# Patient Record
Sex: Female | Born: 1950
Health system: Southern US, Community
[De-identification: ages and names within clinical notes are randomized; demographics above are authoritative.]

## PROBLEM LIST (undated history)

## (undated) DIAGNOSIS — H409 Unspecified glaucoma: Secondary | ICD-10-CM

## (undated) DIAGNOSIS — H269 Unspecified cataract: Secondary | ICD-10-CM

## (undated) DIAGNOSIS — K219 Gastro-esophageal reflux disease without esophagitis: Secondary | ICD-10-CM

## (undated) DIAGNOSIS — G43909 Migraine, unspecified, not intractable, without status migrainosus: Secondary | ICD-10-CM

## (undated) DIAGNOSIS — M8580 Other specified disorders of bone density and structure, unspecified site: Secondary | ICD-10-CM

## (undated) DIAGNOSIS — M51369 Other intervertebral disc degeneration, lumbar region without mention of lumbar back pain or lower extremity pain: Secondary | ICD-10-CM

## (undated) DIAGNOSIS — M47812 Spondylosis without myelopathy or radiculopathy, cervical region: Secondary | ICD-10-CM

## (undated) DIAGNOSIS — F419 Anxiety disorder, unspecified: Secondary | ICD-10-CM

## (undated) DIAGNOSIS — M5136 Other intervertebral disc degeneration, lumbar region: Secondary | ICD-10-CM

## (undated) DIAGNOSIS — I10 Essential (primary) hypertension: Secondary | ICD-10-CM

## (undated) DIAGNOSIS — M797 Fibromyalgia: Secondary | ICD-10-CM

## (undated) DIAGNOSIS — M1990 Unspecified osteoarthritis, unspecified site: Secondary | ICD-10-CM

## (undated) DIAGNOSIS — J302 Other seasonal allergic rhinitis: Secondary | ICD-10-CM

## (undated) DIAGNOSIS — G479 Sleep disorder, unspecified: Secondary | ICD-10-CM

## (undated) DIAGNOSIS — F329 Major depressive disorder, single episode, unspecified: Secondary | ICD-10-CM

## (undated) DIAGNOSIS — F32A Depression, unspecified: Secondary | ICD-10-CM

## (undated) HISTORY — PX: CATARACT EXTRACTION: SUR2

## (undated) HISTORY — DX: Other seasonal allergic rhinitis: J30.2

## (undated) HISTORY — DX: Migraine, unspecified, not intractable, without status migrainosus: G43.909

## (undated) HISTORY — DX: Other intervertebral disc degeneration, lumbar region without mention of lumbar back pain or lower extremity pain: M51.369

## (undated) HISTORY — DX: Depression, unspecified: F32.A

## (undated) HISTORY — DX: Fibromyalgia: M79.7

## (undated) HISTORY — DX: Other specified disorders of bone density and structure, unspecified site: M85.80

## (undated) HISTORY — DX: Unspecified glaucoma: H40.9

## (undated) HISTORY — DX: Spondylosis without myelopathy or radiculopathy, cervical region: M47.812

## (undated) HISTORY — DX: Sleep disorder, unspecified: G47.9

## (undated) HISTORY — DX: Other intervertebral disc degeneration, lumbar region: M51.36

## (undated) HISTORY — DX: Anxiety disorder, unspecified: F41.9

## (undated) HISTORY — DX: Unspecified cataract: H26.9

## (undated) HISTORY — DX: Major depressive disorder, single episode, unspecified: F32.9

## (undated) HISTORY — DX: Unspecified osteoarthritis, unspecified site: M19.90

## (undated) HISTORY — DX: Essential (primary) hypertension: I10

## (undated) HISTORY — DX: Gastro-esophageal reflux disease without esophagitis: K21.9

---

## 1997-10-26 ENCOUNTER — Other Ambulatory Visit: Admission: RE | Admit: 1997-10-26 | Discharge: 1997-10-26 | Payer: Self-pay | Admitting: *Deleted

## 1998-02-14 ENCOUNTER — Other Ambulatory Visit: Admission: RE | Admit: 1998-02-14 | Discharge: 1998-02-14 | Payer: Self-pay | Admitting: *Deleted

## 1998-04-25 ENCOUNTER — Other Ambulatory Visit: Admission: RE | Admit: 1998-04-25 | Discharge: 1998-04-25 | Payer: Self-pay | Admitting: *Deleted

## 1999-05-09 ENCOUNTER — Other Ambulatory Visit: Admission: RE | Admit: 1999-05-09 | Discharge: 1999-05-09 | Payer: Self-pay | Admitting: *Deleted

## 2000-01-04 ENCOUNTER — Encounter: Payer: Self-pay | Admitting: Family Medicine

## 2000-01-04 ENCOUNTER — Encounter: Admission: RE | Admit: 2000-01-04 | Discharge: 2000-01-04 | Payer: Self-pay | Admitting: Family Medicine

## 2000-01-11 ENCOUNTER — Encounter: Admission: RE | Admit: 2000-01-11 | Discharge: 2000-01-11 | Payer: Self-pay | Admitting: Family Medicine

## 2000-01-11 ENCOUNTER — Encounter: Payer: Self-pay | Admitting: Family Medicine

## 2000-05-15 ENCOUNTER — Other Ambulatory Visit: Admission: RE | Admit: 2000-05-15 | Discharge: 2000-05-15 | Payer: Self-pay | Admitting: *Deleted

## 2000-05-17 ENCOUNTER — Encounter: Payer: Self-pay | Admitting: *Deleted

## 2000-05-17 ENCOUNTER — Encounter: Admission: RE | Admit: 2000-05-17 | Discharge: 2000-05-17 | Payer: Self-pay | Admitting: *Deleted

## 2001-03-03 ENCOUNTER — Encounter: Payer: Self-pay | Admitting: Family Medicine

## 2001-03-03 ENCOUNTER — Encounter: Admission: RE | Admit: 2001-03-03 | Discharge: 2001-03-03 | Payer: Self-pay | Admitting: Family Medicine

## 2001-05-27 ENCOUNTER — Other Ambulatory Visit: Admission: RE | Admit: 2001-05-27 | Discharge: 2001-05-27 | Payer: Self-pay | Admitting: Obstetrics & Gynecology

## 2001-08-11 ENCOUNTER — Encounter: Admission: RE | Admit: 2001-08-11 | Discharge: 2001-08-11 | Payer: Self-pay | Admitting: Family Medicine

## 2001-08-11 ENCOUNTER — Encounter: Payer: Self-pay | Admitting: Family Medicine

## 2001-11-10 ENCOUNTER — Encounter: Admission: RE | Admit: 2001-11-10 | Discharge: 2001-11-10 | Payer: Self-pay | Admitting: Family Medicine

## 2001-11-10 ENCOUNTER — Encounter: Payer: Self-pay | Admitting: Family Medicine

## 2002-03-31 ENCOUNTER — Encounter: Payer: Self-pay | Admitting: Family Medicine

## 2002-03-31 ENCOUNTER — Encounter: Admission: RE | Admit: 2002-03-31 | Discharge: 2002-03-31 | Payer: Self-pay | Admitting: Family Medicine

## 2002-04-10 ENCOUNTER — Encounter: Admission: RE | Admit: 2002-04-10 | Discharge: 2002-04-10 | Payer: Self-pay | Admitting: Family Medicine

## 2002-04-10 ENCOUNTER — Encounter: Payer: Self-pay | Admitting: Family Medicine

## 2002-06-01 ENCOUNTER — Other Ambulatory Visit: Admission: RE | Admit: 2002-06-01 | Discharge: 2002-06-01 | Payer: Self-pay | Admitting: Obstetrics and Gynecology

## 2003-01-29 ENCOUNTER — Inpatient Hospital Stay (HOSPITAL_COMMUNITY): Admission: AD | Admit: 2003-01-29 | Discharge: 2003-02-03 | Payer: Self-pay | Admitting: Family Medicine

## 2003-01-30 ENCOUNTER — Encounter: Payer: Self-pay | Admitting: Internal Medicine

## 2003-02-09 ENCOUNTER — Encounter: Admission: RE | Admit: 2003-02-09 | Discharge: 2003-02-09 | Payer: Self-pay | Admitting: Family Medicine

## 2003-02-09 ENCOUNTER — Encounter: Payer: Self-pay | Admitting: Family Medicine

## 2003-05-24 ENCOUNTER — Encounter: Admission: RE | Admit: 2003-05-24 | Discharge: 2003-05-24 | Payer: Self-pay | Admitting: Family Medicine

## 2003-07-13 ENCOUNTER — Encounter: Admission: RE | Admit: 2003-07-13 | Discharge: 2003-07-13 | Payer: Self-pay | Admitting: Obstetrics and Gynecology

## 2003-09-22 ENCOUNTER — Encounter: Admission: RE | Admit: 2003-09-22 | Discharge: 2003-09-22 | Payer: Self-pay | Admitting: Family Medicine

## 2003-10-01 ENCOUNTER — Encounter: Admission: RE | Admit: 2003-10-01 | Discharge: 2003-10-01 | Payer: Self-pay | Admitting: Family Medicine

## 2003-10-22 ENCOUNTER — Encounter: Admission: RE | Admit: 2003-10-22 | Discharge: 2003-10-22 | Payer: Self-pay | Admitting: Family Medicine

## 2004-04-05 ENCOUNTER — Ambulatory Visit: Payer: Self-pay | Admitting: Oncology

## 2004-06-13 ENCOUNTER — Encounter: Admission: RE | Admit: 2004-06-13 | Discharge: 2004-06-13 | Payer: Self-pay | Admitting: Family Medicine

## 2004-12-15 ENCOUNTER — Encounter: Admission: RE | Admit: 2004-12-15 | Discharge: 2004-12-15 | Payer: Self-pay | Admitting: Family Medicine

## 2005-02-27 ENCOUNTER — Ambulatory Visit: Payer: Self-pay | Admitting: Internal Medicine

## 2005-03-09 ENCOUNTER — Ambulatory Visit: Payer: Self-pay | Admitting: Internal Medicine

## 2005-04-12 ENCOUNTER — Encounter: Admission: RE | Admit: 2005-04-12 | Discharge: 2005-04-12 | Payer: Self-pay | Admitting: Family Medicine

## 2005-04-24 ENCOUNTER — Encounter: Admission: RE | Admit: 2005-04-24 | Discharge: 2005-04-24 | Payer: Self-pay | Admitting: Family Medicine

## 2005-06-15 ENCOUNTER — Encounter: Admission: RE | Admit: 2005-06-15 | Discharge: 2005-06-15 | Payer: Self-pay | Admitting: Family Medicine

## 2005-06-19 ENCOUNTER — Encounter: Admission: RE | Admit: 2005-06-19 | Discharge: 2005-06-19 | Payer: Self-pay | Admitting: Family Medicine

## 2005-06-20 ENCOUNTER — Ambulatory Visit: Payer: Self-pay | Admitting: Internal Medicine

## 2005-06-22 ENCOUNTER — Ambulatory Visit: Payer: Self-pay | Admitting: Internal Medicine

## 2005-07-02 ENCOUNTER — Encounter: Admission: RE | Admit: 2005-07-02 | Discharge: 2005-07-02 | Payer: Self-pay | Admitting: Obstetrics and Gynecology

## 2005-07-20 ENCOUNTER — Ambulatory Visit: Payer: Self-pay | Admitting: Internal Medicine

## 2005-09-26 ENCOUNTER — Ambulatory Visit: Payer: Self-pay | Admitting: Internal Medicine

## 2005-10-15 ENCOUNTER — Encounter: Admission: RE | Admit: 2005-10-15 | Discharge: 2005-10-15 | Payer: Self-pay | Admitting: Family Medicine

## 2005-10-30 ENCOUNTER — Ambulatory Visit: Payer: Self-pay | Admitting: Internal Medicine

## 2006-05-20 ENCOUNTER — Ambulatory Visit: Payer: Self-pay | Admitting: Internal Medicine

## 2006-05-28 HISTORY — PX: HAMMER TOE SURGERY: SHX385

## 2006-05-28 HISTORY — PX: OTHER SURGICAL HISTORY: SHX169

## 2006-07-09 ENCOUNTER — Encounter: Admission: RE | Admit: 2006-07-09 | Discharge: 2006-07-09 | Payer: Self-pay | Admitting: Family Medicine

## 2006-09-02 ENCOUNTER — Ambulatory Visit: Payer: Self-pay | Admitting: Oncology

## 2006-09-03 LAB — CBC WITH DIFFERENTIAL (CANCER CENTER ONLY)
BASO#: 0 10*3/uL (ref 0.0–0.2)
BASO%: 0.6 % (ref 0.0–2.0)
EOS%: 2.7 % (ref 0.0–7.0)
Eosinophils Absolute: 0.1 10*3/uL (ref 0.0–0.5)
HCT: 42.6 % (ref 34.8–46.6)
HGB: 14.5 g/dL (ref 11.6–15.9)
LYMPH#: 2.1 10*3/uL (ref 0.9–3.3)
LYMPH%: 49.6 % — ABNORMAL HIGH (ref 14.0–48.0)
MCH: 29.7 pg (ref 26.0–34.0)
MCHC: 34 g/dL (ref 32.0–36.0)
MCV: 87 fL (ref 81–101)
MONO#: 0.3 10*3/uL (ref 0.1–0.9)
MONO%: 7.7 % (ref 0.0–13.0)
NEUT#: 1.6 10*3/uL (ref 1.5–6.5)
NEUT%: 39.4 % — ABNORMAL LOW (ref 39.6–80.0)
Platelets: 201 10*3/uL (ref 145–400)
RBC: 4.88 10*6/uL (ref 3.70–5.32)
RDW: 13.1 % (ref 10.5–14.6)
WBC: 4.1 10*3/uL (ref 3.9–10.0)

## 2006-09-06 LAB — HYPERCOAGULABLE PANEL, COMPREHENSIVE
AntiThromb III Func: 120 % (ref 75–120)
Anticardiolipin IgA: <7 [APL'U] (ref <13)
Anticardiolipin IgG: <7 [GPL'U] (ref <11)
Anticardiolipin IgM: <7 [MPL'U] (ref <10)
Beta-2 Glyco I IgG: 5 U/mL (ref <20)
Beta-2-Glycoprotein I IgA: <4 U/mL (ref <10)
Beta-2-Glycoprotein I IgM: <4 U/mL (ref <10)
DRVVT: 36.5 secs (ref 36.1–47.0)
Homocysteine: 6.6 umol/L (ref 4.0–15.4)
PTT Lupus Anticoagulant: 36.5 secs (ref 36.3–48.8)
Protein C Activity: 153 % — ABNORMAL HIGH (ref 91–147)
Protein C, Total: 102 % (ref 70–140)
Protein S Activity: 109 % (ref 81–180)
Protein S Ag, Total: 141 % — ABNORMAL HIGH (ref 70–140)

## 2006-09-06 LAB — COMPREHENSIVE METABOLIC PANEL
ALT: 18 U/L (ref 0–35)
AST: 23 U/L (ref 0–37)
Albumin: 4.1 g/dL (ref 3.5–5.2)
Alkaline Phosphatase: 73 U/L (ref 39–117)
BUN: 19 mg/dL (ref 6–23)
CO2: 29 mEq/L (ref 19–32)
Calcium: 10 mg/dL (ref 8.4–10.5)
Chloride: 105 mEq/L (ref 96–112)
Creatinine, Ser: 0.92 mg/dL (ref 0.40–1.20)
Glucose, Bld: 67 mg/dL — ABNORMAL LOW (ref 70–99)
Potassium: 3.7 mEq/L (ref 3.5–5.3)
Sodium: 143 mEq/L (ref 135–145)
Total Bilirubin: 0.6 mg/dL (ref 0.3–1.2)
Total Protein: 7 g/dL (ref 6.0–8.3)

## 2006-09-06 LAB — LACTATE DEHYDROGENASE: LDH: 184 U/L (ref 94–250)

## 2006-09-06 LAB — FERRITIN: Ferritin: 11 ng/mL (ref 10–291)

## 2006-09-06 LAB — IRON AND TIBC
%SAT: 23 % (ref 20–55)
Iron: 96 ug/dL (ref 42–145)
TIBC: 417 ug/dL (ref 250–470)
UIBC: 321 ug/dL

## 2006-09-08 ENCOUNTER — Emergency Department (HOSPITAL_COMMUNITY): Admission: EM | Admit: 2006-09-08 | Discharge: 2006-09-08 | Payer: Self-pay | Admitting: Emergency Medicine

## 2007-03-10 ENCOUNTER — Ambulatory Visit: Payer: Self-pay | Admitting: Internal Medicine

## 2007-07-18 ENCOUNTER — Encounter: Admission: RE | Admit: 2007-07-18 | Discharge: 2007-07-18 | Payer: Self-pay | Admitting: Family Medicine

## 2007-08-07 DIAGNOSIS — M47812 Spondylosis without myelopathy or radiculopathy, cervical region: Secondary | ICD-10-CM | POA: Insufficient documentation

## 2007-08-07 DIAGNOSIS — I10 Essential (primary) hypertension: Secondary | ICD-10-CM | POA: Insufficient documentation

## 2007-08-07 DIAGNOSIS — H409 Unspecified glaucoma: Secondary | ICD-10-CM | POA: Insufficient documentation

## 2007-08-07 DIAGNOSIS — K219 Gastro-esophageal reflux disease without esophagitis: Secondary | ICD-10-CM | POA: Insufficient documentation

## 2007-08-07 DIAGNOSIS — K224 Dyskinesia of esophagus: Secondary | ICD-10-CM | POA: Insufficient documentation

## 2007-08-07 DIAGNOSIS — F341 Dysthymic disorder: Secondary | ICD-10-CM | POA: Insufficient documentation

## 2007-08-07 DIAGNOSIS — I82409 Acute embolism and thrombosis of unspecified deep veins of unspecified lower extremity: Secondary | ICD-10-CM | POA: Insufficient documentation

## 2007-08-07 DIAGNOSIS — R011 Cardiac murmur, unspecified: Secondary | ICD-10-CM | POA: Insufficient documentation

## 2007-08-07 DIAGNOSIS — Z862 Personal history of diseases of the blood and blood-forming organs and certain disorders involving the immune mechanism: Secondary | ICD-10-CM | POA: Insufficient documentation

## 2007-08-07 DIAGNOSIS — E785 Hyperlipidemia, unspecified: Secondary | ICD-10-CM | POA: Insufficient documentation

## 2007-08-07 DIAGNOSIS — IMO0001 Reserved for inherently not codable concepts without codable children: Secondary | ICD-10-CM | POA: Insufficient documentation

## 2007-08-07 DIAGNOSIS — K449 Diaphragmatic hernia without obstruction or gangrene: Secondary | ICD-10-CM | POA: Insufficient documentation

## 2007-08-07 DIAGNOSIS — G4730 Sleep apnea, unspecified: Secondary | ICD-10-CM | POA: Insufficient documentation

## 2007-08-07 DIAGNOSIS — K222 Esophageal obstruction: Secondary | ICD-10-CM | POA: Insufficient documentation

## 2008-04-30 ENCOUNTER — Telehealth: Payer: Self-pay | Admitting: Internal Medicine

## 2008-05-04 ENCOUNTER — Ambulatory Visit: Payer: Self-pay | Admitting: Internal Medicine

## 2008-05-05 LAB — CONVERTED CEMR LAB: Vitamin B-12: 460 pg/mL (ref 211–911)

## 2008-05-06 LAB — CONVERTED CEMR LAB: Tissue Transglutaminase Ab, IgA: 0 units (ref <7)

## 2008-05-07 ENCOUNTER — Telehealth: Payer: Self-pay | Admitting: Internal Medicine

## 2008-05-10 ENCOUNTER — Telehealth: Payer: Self-pay | Admitting: Internal Medicine

## 2008-05-11 ENCOUNTER — Encounter: Payer: Self-pay | Admitting: Internal Medicine

## 2008-05-11 ENCOUNTER — Telehealth: Payer: Self-pay | Admitting: Internal Medicine

## 2008-06-17 ENCOUNTER — Telehealth: Payer: Self-pay | Admitting: Internal Medicine

## 2008-07-21 ENCOUNTER — Encounter: Admission: RE | Admit: 2008-07-21 | Discharge: 2008-07-21 | Payer: Self-pay | Admitting: Family Medicine

## 2008-08-23 ENCOUNTER — Telehealth: Payer: Self-pay | Admitting: Internal Medicine

## 2009-04-19 ENCOUNTER — Encounter: Admission: RE | Admit: 2009-04-19 | Discharge: 2009-04-19 | Payer: Self-pay | Admitting: Family Medicine

## 2009-06-20 ENCOUNTER — Encounter: Admission: RE | Admit: 2009-06-20 | Discharge: 2009-06-20 | Payer: Self-pay | Admitting: Family Medicine

## 2009-07-05 ENCOUNTER — Encounter: Admission: RE | Admit: 2009-07-05 | Discharge: 2009-07-05 | Payer: Self-pay | Admitting: Family Medicine

## 2009-07-26 ENCOUNTER — Encounter: Admission: RE | Admit: 2009-07-26 | Discharge: 2009-07-26 | Payer: Self-pay | Admitting: Family Medicine

## 2009-10-20 ENCOUNTER — Encounter: Admission: RE | Admit: 2009-10-20 | Discharge: 2009-10-20 | Payer: Self-pay | Admitting: Family Medicine

## 2010-02-07 ENCOUNTER — Encounter: Admission: RE | Admit: 2010-02-07 | Discharge: 2010-02-07 | Payer: Self-pay | Admitting: Emergency Medicine

## 2010-02-10 ENCOUNTER — Encounter: Admission: RE | Admit: 2010-02-10 | Discharge: 2010-02-10 | Payer: Self-pay | Admitting: Emergency Medicine

## 2010-04-04 ENCOUNTER — Encounter: Admission: RE | Admit: 2010-04-04 | Discharge: 2010-04-04 | Payer: Self-pay | Admitting: Family Medicine

## 2010-05-26 ENCOUNTER — Encounter
Admission: RE | Admit: 2010-05-26 | Discharge: 2010-05-26 | Payer: Self-pay | Source: Home / Self Care | Attending: Family Medicine | Admitting: Family Medicine

## 2010-05-28 HISTORY — PX: ROTATOR CUFF REPAIR: SHX139

## 2010-06-18 ENCOUNTER — Encounter: Payer: Self-pay | Admitting: Obstetrics

## 2010-06-18 ENCOUNTER — Encounter: Payer: Self-pay | Admitting: Family Medicine

## 2010-06-18 ENCOUNTER — Encounter: Payer: Self-pay | Admitting: Neurology

## 2010-06-23 ENCOUNTER — Other Ambulatory Visit: Payer: Self-pay | Admitting: Obstetrics

## 2010-06-23 DIAGNOSIS — Z1239 Encounter for other screening for malignant neoplasm of breast: Secondary | ICD-10-CM

## 2010-06-29 NOTE — Progress Notes (Signed)
Summary: Zegrid   Phone Note Call from Patient Call back at Work Phone [phone]   Call For: Dr Juanda Chance Reason for Call: Talk to Nurse Summary of Call: Regarding her Zegrid Initial call taken by: Leanor Kail [facility],  May 11, 2008 10:24 AM  Follow-up for Phone Call        I have called pharmacy who states that they have gotten zegerid to go through.  Patient advised. Follow-up by: Hortense Ramal CMA,  May 11, 2008 3:45 PM

## 2010-06-29 NOTE — Procedures (Signed)
Summary: Gastroenterology EGD  Gastroenterology EGD   Imported By: Milford Cage CMA 08/07/2007 11:09:29  _____________________________________________________________________  External Attachment:    Type:   Image     Comment:   External Document

## 2010-06-29 NOTE — Assessment & Plan Note (Signed)
Summary: WORSENING GERD     (SEE TRIAGE FROM 04-30-08)    DEBORAH    History of Present Illness Visit Type: follow up Primary GI MD: Lina Sar MD Primary Provider: Mosetta Putt, MD Chief Complaint: GERD, Hiatal Hernia symptoms worsening, constant clearing throat. Pt. requesting testing for ciliac disease and lactose intolerence, also questioning b12 deficiency. History of Present Illness:   This is a 60 year old white female with chronic gastroesophageal reflux. Her last upper endoscopy in January 2007 showed a hiatal hernia and mild esophageal stricture. The patient did better after beginning Zegerid 40 mg twice a day and Carafate slurry. She was dilated with Savary dilators to 16 mm. She is now having recurrence of her reflux symptoms, having breakthrough heartburn at night. She is on omeprazole 20 mg twice a day. Stress is currently a big problem.   GI Review of Systems    Reports abdominal pain, acid reflux, belching, bloating, and  heartburn.     Location of  Abdominal pain: epigastric area.    Denies chest pain, dysphagia with liquids, dysphagia with solids, loss of appetite, nausea, vomiting, vomiting blood, weight loss, and  weight gain.      Reports hemorrhoids and  irritable bowel syndrome.     Denies anal fissure, black tarry stools, change in bowel habit, constipation, diarrhea, diverticulosis, fecal incontinence, heme positive stool, jaundice, light color stool, liver problems, rectal bleeding, and  rectal pain.     Prior Medications Reviewed Using: List Brought by Patient  Updated Prior Medication List: OMEPRAZOLE 20 MG CPDR (OMEPRAZOLE) Take 1 tablet by mouth daily. AXERT 12.5 MG TABS (ALMOTRIPTAN MALATE) as needed for migraine NASONEX 50 MCG/ACT SUSP (MOMETASONE FUROATE) as needed * SOOTHE LUBRICANT EYE DROPS 1 drop in each eye daily SIMPLY SALINE 0.9 % AERS (SALINE) nasal spray as needed for allergies CITRACAL/VITAMIN D 250-200 MG-UNIT TABS (CALCIUM CITRATE-VITAMIN  D) 1 tablet by mouth two times a day FISH OIL 300 MG CAPS (OMEGA-3 FATTY ACIDS) 1 capsule three times a day with meals D 1000 PLUS  TABS (FA-CYANOCOBALAMIN-B6-D-CA) 1 tablet by mouth once daily MULTIVITAMINS   TABS (MULTIPLE VITAMIN) 1 tablet by mouth once daily ASPIRIN 81 MG  TABS (ASPIRIN) 1 tablet by mouth once daily  Current Allergies (reviewed today): ! ERYTHROMYCIN  Past Medical History:    Reviewed history from 08/07/2007 and no changes required:       Current Problems:        HIATAL HERNIA (ICD-553.3)       ESOPHAGEAL STRICTURE (ICD-530.3)       DYSKINESIA OF ESOPHAGUS (ICD-530.5)       ANEMIA, IRON DEFICIENCY, HX OF (ICD-V12.3)       GERD (ICD-530.81)       SPONDYLOSIS, CERVICAL (ICD-721.0)       GLAUCOMA (ICD-365.9)       DVT (ICD-453.40)       FIBROMYALGIA (ICD-729.1)       HEART MURMUR, BENIGN (ICD-785.2)       SLEEP APNEA (ICD-780.57)       ANXIETY DEPRESSION (ICD-300.4)       HYPERLIPIDEMIA (ICD-272.4)       HYPERTENSION (ICD-401.9)         Past Surgical History:    Reviewed history from 08/07/2007 and no changes required:       c-section  1982       foot surgery   Family History:    Reviewed history from 04/30/2008 and no changes required:       Family  History of Heart Disease: Father       No FH of Colon Cancer:  Social History:    Reviewed history from 04/30/2008 and no changes required:       Occupation: Runner, broadcasting/film/video       Alcohol Use - no       Illicit Drug Use - no    Review of Systems       The patient complains of allergy/sinus.     Vital Signs:  Patient Profile:   59 Years Old Female Height:     60 inches Weight:      153.13 pounds BMI:     30.01 BSA:     1.67 Pulse rate:   80 / minute Pulse rhythm:   regular BP sitting:   130 / 80  (left arm) Cuff size:   regular  Vitals Entered By: Teresita Madura CMA (May 04, 2008 2:30 PM)                  Physical Exam  General:     Well developed, well nourished, no acute  distress. Neck:     Supple; no masses or thyromegaly. Lungs:     Clear throughout to auscultation. Heart:     Regular rate and rhythm; no murmurs, rubs,  or bruits. Abdomen:     soft abdomen with tenderness in epigastrium, normoactive bowel sounds, no distention, no hernia. Extremities:     No clubbing, cyanosis, edema or deformities noted.    Impression & Recommendations:  Problem # 1:  GERD (ICD-530.81) chronic gastroesophageal reflux only partially controlled on omeprazole 20 mg a day. I have given her some samples of Zegerid 40 mg twice a day. Patient willl comply with strict antireflux measures. She is interested in having her B12 rechecked. We will do this as well as a tissue transglutamase level. She was also interested in having a lactose intolerance test but she would have to go to [facility] to have that done due to the fact that our hospital does not offer that test.  Other Orders: TLB-B12, Serum-Total ONLY ([id number]) T-Tissue Transglutamase Ab IgA ([id number])   Patient Instructions: 1)  Zegerid 40 mg p.o. b.i.d. 2)  Antireflux measures 3)  B12 level, tissue transglutaminase, 4)  If the symptoms continue, repeat upper endoscopy 5)  Copy Sent To:Dr Kirk.Braver    ]

## 2010-06-29 NOTE — Progress Notes (Signed)
Summary: RX NOT IN PHARMACY  Medications Added ZEGERID 40-1100 MG CAPS (OMEPRAZOLE-SODIUM BICARBONATE) Take 1 tablet by mouth two times a day       Phone Note Call from Patient Call back at Home Phone [phone]   Caller: Patient Call For: Skyelar Swigart Reason for Call: Talk to Nurse Summary of Call: RX WAS NEVER CALLED IN FOR ZEGERID CAN WE PLEASE CALL IT IN TODAY [facility] [address] Initial call taken by: Tawni Levy,  May 07, 2008 3:51 PM  Follow-up for Phone Call        Patient advised that rx has been sent to pharmacy. Follow-up by: Hortense Ramal CMA,  May 07, 2008 4:00 PM    New/Updated Medications: ZEGERID 40-1100 MG CAPS (OMEPRAZOLE-SODIUM BICARBONATE) Take 1 tablet by mouth two times a day   Prescriptions: ZEGERID 40-1100 MG CAPS (OMEPRAZOLE-SODIUM BICARBONATE) Take 1 tablet by mouth two times a day  #60 x 2   Entered by:   Hortense Ramal CMA   Authorized by:   Hart Carwin MD   Signed by:   Hortense Ramal CMA on 05/07/2008   Method used:   [facility]ronically to        [phone][facility] (retail)       [address]       [place], [place]  [address]       Ph: [phone] or [phone]       Fax: [phone]   RxID:   [id number]

## 2010-06-29 NOTE — Medication Information (Signed)
Summary: RX Folder-Zegerid  RX Folder-Zegerid   Imported By: Hortense Ramal CMA 05/11/2008 15:17:27  _____________________________________________________________________  External Attachment:    Type:   Image     Comment:   External Document

## 2010-06-29 NOTE — Progress Notes (Signed)
Summary: RX ?   Phone Note Call from Patient Call back at Home Phone [phone] Call back at [phone]   Caller: Patient Call For: Wynema Garoutte Reason for Call: Talk to Nurse Details for Reason: RX ? Summary of Call: pt said Omeprazole was sent to pahrmacy, but pt says she was supposed to be switched b/c said drug has not been working... [facility] [place], [place]  Initial call taken by: Vallarie Mare,  May 10, 2008 4:01 PM  Follow-up for Phone Call        patient advised rx for zegerid has already been sent to pharmacy and has already been approved by insurance company. Follow-up by: Hortense Ramal CMA,  May 10, 2008 4:33 PM

## 2010-06-29 NOTE — Progress Notes (Signed)
Summary: meds denied  Medications Added OMEPRAZOLE 20 MG TBEC (OMEPRAZOLE) Take 1 tablet by mouth two times a day       Phone Note Call from Patient Call back at Home Phone [phone] Call back at C # [phone] call after 9:20 am    Caller: Patient Call For: BRODIE  Reason for Call: Talk to Nurse Details for Reason: meds denied Summary of Call: was told by pharm pt needs OV before refill wishes to discuss with Dottie Initial call taken by: Guadlupe Spanish [facility],  August 23, 2008 9:06 AM  Follow-up for Phone Call        Rx sent.  Patient no longer on zegerid due to side effects. Follow-up by: Hortense Ramal CMA,  August 23, 2008 9:15 AM    New/Updated Medications: OMEPRAZOLE 20 MG TBEC (OMEPRAZOLE) Take 1 tablet by mouth two times a day   Prescriptions: OMEPRAZOLE 20 MG TBEC (OMEPRAZOLE) Take 1 tablet by mouth two times a day  #60 x 3   Entered by:   Hortense Ramal CMA   Authorized by:   Hart Carwin   Signed by:   Hortense Ramal CMA on 08/23/2008   Method used:   [facility]ronically to        [facility] [phone][facility] (retail)       [address]       [place], [place]  [address]       Ph: [phone] or [phone]       Fax: [phone]   RxID:   [id number]

## 2010-06-29 NOTE — Procedures (Signed)
Summary: COLON   Colonoscopy  Procedure date:  03/09/2005  Findings:      Location:  [facility].    Procedures Next Due Date:    Colonoscopy: 02/2015  Patient Name: Anna Arnold, Anna Arnold. MRN:  Procedure Procedures: Colonoscopy CPT: [phone].  Personnel: Endoscopist: Dora L. Juanda Chance, MD.  Referred By: Mosetta Putt, M.D.  Exam Location: Exam performed in Outpatient Clinic. Outpatient  Patient Consent: Procedure, Alternatives, Risks and Benefits discussed, consent obtained, from patient. Consent was obtained by the RN.  Indications Symptoms: Constipation Patient's stools are infrequent.  Average Risk Screening Routine.  History  Current Medications: Patient is not currently taking Coumadin.  Pre-Exam Physical: Performed Mar 09, 2005. Entire physical exam was normal.  Exam Exam: Extent of exam reached: Cecum, extent intended: Cecum.  The cecum was identified by appendiceal orifice and IC valve. Colon retroflexion performed. Images taken. ASA Classification: I. Tolerance: good.  Monitoring: Pulse and BP monitoring, Oximetry used. Supplemental O2 given.  Colon Prep Used Miralax for colon prep. Prep results: good.  Sedation Meds: Patient assessed and found to be appropriate for moderate (conscious) sedation. Fentanyl 75 mcg. given IV. Versed 7 mg. given IV.  Findings - NORMAL EXAM: Ileum to Cecum.  - NORMAL EXAM: Rectum.   Assessment Normal examination.  Comments: no polyps Events  Unplanned Interventions: No intervention was required.  Unplanned Events: There were no complications. Plans Patient Education: Patient given standard instructions for: Yearly hemoccult testing recommended. Patient instructed to get routine colonoscopy every 10 years.  Comments: follow up with Dr Duaine Dredge Disposition: After procedure patient sent to recovery. After recovery patient sent home.    This report was created from the original endoscopy report, which  was reviewed and signed by the above listed endoscopist.

## 2010-06-29 NOTE — Progress Notes (Signed)
Summary: TRIAGE-Worsening GERD  Medications Added OMEPRAZOLE 20 MG CPDR (OMEPRAZOLE) Take 1 tablet by mouth daily.       Phone Note Call from Patient Call back at Work Phone [phone]   Call For: Dr Juanda Chance Reason for Call: Refill Medication Summary of Call: Omeprozole only has one pill left. Was adviced she needed rov before refill but has tried to make appoinment for the past month and has not been able to. Next avail is now middle of January. She needs to be seen sooner. Is having some issues, can we work her in?  Initial call taken by: Leanor Kail [facility],  April 30, 2008 9:21 AM  Follow-up for Phone Call        Pt. c/o worsening GERD-indigestion,bloating,increased belching. Takes Omeprazole once daily. 1)Increase Omeprazole to two times a day X 5 days,then back to daily. 2) Gas-x,Phazyme, etc. as needed for gas & bloating. 3)Soft,bland diet. No spicy,greasy,fried foods. 4) Appointment w/Dr.Brodie on 05-04-08 at 2pm 5)Pt. instructed to call back as needed.    Follow-up by: Laureen Ochs LPN,  April 30, 2008 9:39 AM    New/Updated Medications: OMEPRAZOLE 20 MG CPDR (OMEPRAZOLE) Take 1 tablet by mouth daily.   Prescriptions: OMEPRAZOLE 20 MG CPDR (OMEPRAZOLE) Take 1 tablet by mouth daily.  #30 x 0   Entered by:   Laureen Ochs LPN   Authorized by:   Hart Carwin MD   Signed by:   Laureen Ochs LPN on 09/81/1914   Method used:   Electronically to        [facility] [phone][facility] (retail)       [address]       [place], [place]  [address]       Ph: [phone] or [phone]       Fax: [phone]   RxID:   [id number]   Appended Document: TRIAGE-Worsening GERD Above triage reviewed w/Dr.Brodie by phone, no new orders.

## 2010-06-29 NOTE — Progress Notes (Signed)
Summary: CONDITION UPDATE   Phone Note Call from Patient Call back at Home Phone [phone] Call back at [phone]   Caller: Patient Call For: Dr. Juanda Chance Reason for Call: Talk to Nurse Details for Reason: back on Omeprazole Summary of Call: pt was on Zegrid and switched herself back to Omeprazole b/c the Zegrid was causing side effects such as... rapid heartrate daily headaches nausea weight gain bloated extreme fatigue blurred vision abd pain constipation increased agitation flatulence pt is now feeling better since being back on Omeprazole... pt aware that weight is a factor in her GERD and knows that she needs to lose weight to help with her GERD symptoms... pt just wanted to give an update Initial call taken by: Vallarie Mare,  June 17, 2008 9:38 AM  Follow-up for Phone Call        Reviewed.  Follow-up by: Hart Carwin MD,  June 17, 2008 1:29 PM

## 2010-07-28 ENCOUNTER — Ambulatory Visit
Admission: RE | Admit: 2010-07-28 | Discharge: 2010-07-28 | Disposition: A | Discharge status: Home / Self Care | Payer: BC Managed Care – PPO | Source: Ambulatory Visit | Attending: Obstetrics | Admitting: Obstetrics

## 2010-07-28 DIAGNOSIS — Z1239 Encounter for other screening for malignant neoplasm of breast: Secondary | ICD-10-CM

## 2010-08-01 ENCOUNTER — Other Ambulatory Visit: Payer: Self-pay | Admitting: Family Medicine

## 2010-08-01 ENCOUNTER — Ambulatory Visit
Admission: RE | Admit: 2010-08-01 | Discharge: 2010-08-01 | Disposition: A | Discharge status: Home / Self Care | Payer: BC Managed Care – PPO | Source: Ambulatory Visit | Attending: Family Medicine | Admitting: Family Medicine

## 2010-08-01 DIAGNOSIS — M25511 Pain in right shoulder: Secondary | ICD-10-CM

## 2010-10-10 NOTE — Assessment & Plan Note (Signed)
[facility]                         GASTROENTEROLOGY OFFICE NOTE   NAME:Brothers, DARIUS                        MRN:          [id number]  DATE:03/10/2007                            DOB:          April 29, 1951    Ms. Hedman is a 60 year old white female with history of  gastroesophageal reflux, epigastric pain.  Last endoscopy January 2007.  She also has a history of iron deficiency anemia.  Her colonoscopy in  October 2006 was normal.  She has a history of dysphagia.  She has  reflux esophagitis and mild esophageal stricture with hiatal hernia.  She was initially on Carafate, __________ , and Zegerid 40 mg b.i.d.,  but most recently on omeprazole 20 mg p.o. b.i.d. with good control of  her symptoms.  She came today for refill.   MEDICATIONS:  1. Cymbalta 30 mg p.o. daily.  2. Omeprazole 20 mg p.o. b.i.d.  3. Gabapentin 100 mg p.o. daily.  4. Methocarbamol 500 mg p.r.n.  5. Axert 12.5 mg p.r.n.  6. Restasis Refresh sustained eye drops.  7. Flonase.  8. Vitamins.  9. Caltrate.  10.Vitamin D.   PHYSICAL EXAMINATION:  Blood pressure 120/78.  Pulse 84.  Weight 150  pounds.  Patient was not examined.   IMPRESSION:  Gastroesophageal reflux disease.  Currently asymptomatic  with omeprazole.   PLAN:  Refill for omeprazole 20 mg p.o. b.i.d. x1 year.     Hedwig Morton. Juanda Chance, MD  Electronically Signed    DMB/MedQ  DD: 03/10/2007  DT: 03/11/2007  Job #: [id number]   cc:   Mosetta Putt, M.D.

## 2010-10-13 NOTE — Discharge Summary (Signed)
NAME:  Anna Arnold, Anna Arnold                         ACCOUNT NO.:  [id number]   MEDICAL RECORD NO.:  [id number]                   PATIENT TYPE:  INP   LOCATION:  5030                                 FACILITY:  [facility]   PHYSICIAN:  Elliot Cousin, M.D.                 DATE OF BIRTH:  04/17/1951   DATE OF ADMISSION:  01/29/2003  DATE OF DISCHARGE:  02/03/2003                                 DISCHARGE SUMMARY   DISCHARGE DIAGNOSES:  1. Acute right lower extremity deep vein thrombosis.     a. Right lower extremity venous Doppler per [facility] January 29, 2003 revealed a right posterior tibial        vein with thrombus visualized from the ankle to the midcalf, no        evidence of thrombus in the popliteal or greater saphenous vein,        multiple varicosities were visualized with no evidence of thrombosis.  2. Hypertension.  3. Hypokalemia.  4. Depression.   SECONDARY DISCHARGE DIAGNOSES:  1. Chronic migraines.  2. Anxiety.  3. Sleep apnea (not on continuous positive airway pressure).  4. Glaucoma bilaterally.  5. Gastroesophageal reflux disease.  6. Fibromyalgia.  7. Status post cesarean section with pregnancy in the past.  8. Allergic rhinitis.  9. Cervical disk degenerative joint disease.   DISCHARGE MEDICATIONS:  1. Coumadin 4 mg each evening.  2. Effexor XR 75 mg daily.  3. Wellbutrin SR 150 mg daily.  4. BuSpar 15 mg b.i.d.  5. Verapamil 240 mg daily.  6. Xalatan ophthalmic drops 0.005 % one drop in each eye at bedtime.  7. Rhinocort Aqua one spray in each nostril b.i.d.  8. Maxalt 10 mg p.r.n. migraine.  9. Aciphex 20 mg daily.  10.      Calcium carbonate 600 mg b.i.d.  11.      Over-the-counter nasal spray as needed.  12.      DISCONTINUE PREMPRO.   DISCHARGE DISPOSITION:  Anna Arnold was discharged to home on February 03, 2003 in improved and stable condition.  She was advised to follow up with  her primary care physician in the  next day or two for blood work,  specifically to re-check an INR/PT level.  The patient was advised no  driving for 8-65 days and to stay out of work until she is reevaluated by  her primary care physician, Mosetta Putt, M.D.   HISTORY:  Anna Arnold is a 60 year old lady with a past medical history  significant for depression, migraines, hypertension, and postmenopausal who  presented to her primary care physician, Dr. Duaine Dredge on January 29, 2003  with a one to two week history of right lower leg pain.  The pain was  initially mild to moderate.  However, it increased to severe pain over the  past two days prior to  hospital admission.  On the day of admission, she  stated that her right leg was hot, swollen, and heavy feeling.  She denied  any recent long trips.  She denied any trauma to her right leg.  She denied  any previous history of blood clot.  She had been on Prempro for several  years, however.  Her mammogram in March of 2004 was within normal limits.   When the patient was evaluated by Dr. Duaine Dredge in his office, he ordered a  venous Doppler study of her right leg at [facility].  The  results were positive for a thrombus in the right posterior tibial vein  visualized from the ankles to the midcalf.  The patient was therefore  admitted for evaluation and management of the right lower extremity DVT.   HOSPITAL COURSE:  Problem 1.  RIGHT LOWER EXTREMITY DEEP VEIN THROMBOSIS:  The initial management included treating the patient with Lovenox 1 mg per  kilogram subcu q.12 h.  Coumadin started at 5 mg daily was started the  following day.  The patient was treated with Vicodin 5 mg every 4 hours as  needed for pain.  She was provided with supportive care during the  hospitalization.  Specifically, she was to keep her right leg elevated and  she was provided with warm compresses as needed.  The patient was initially  advised to bedrest for 24-36 hours.  However, after  the pain and the  swelling subsided she was encouraged to ambulate in her room.  The pharmacy  was consulted for assistance with dosing of the Coumadin.  The patient was  maintained on Lovenox 1 mg per kilogram subcu q.12 h. throughout the entire  hospital course.  It was discontinued at hospital discharge.  The Coumadin  dosing was increased from 5 mg to 7.5 mg over the hospital course.  Her INRs  subsequently became therapeutic at 2.5 on the last day of hospitalization.  Per the pharmacist recommendation, the patient was changed to Coumadin 4 mg  daily at hospital discharge.  For now the patient will be discharged home on  4 mg daily.  She was advised to follow up with her primary care physician in  the next day or two for a reevaluation of the prothrombin time and INR.  The  patient was totally asymptomatic of right leg pain, swelling, and erythema.   The etiology of the right lower extremity deep vein thrombosis is probably  secondary to the Prempro.  Assessment of the protein C, protein S, and  antithrombin antibodies would be nondiagnostic in the setting of an acute  thrombus.  However, a homocystine level and cardiolipin antibodies were  assessed during the hospitalization.  The homocystine level was within  normal limits at 6.72 and the cardiolipin antibodies were 6 for the IgM and  1 for the IgG cardiolipin antibodies, both well below the positive values.  The patient was advised to stop the Prempro indefinitely.  She was also  advised not to take any other estrogen replacement medications.   Problem 2.  HYPERTENSION:  The patient's blood pressures were well  controlled during the hospital course.  She was maintained on verapamil 240  mg daily.   Problem 3.  HYPOKALEMIA:  The patient's initial potassium level was 3.2.  The cause of the low potassium level was unclear during the hospital course.  The patient has no prior treatment history with diuretics.  Nevertheless, she was  repleted with potassium by mouth.  A magnesium level was assessed  and found to be within normal limits at 2.1.  Her potassium level was 4.2  prior to hospital discharge.  The potassium supplementation was discontinued  at hospital discharge.   Problem 4.  DEPRESSION:  The patient was maintained on her home doses of  Effexor XR, Wellbutrin SR, and BuSpar.  She became a little teary one day  during the hospitalization.  However, she denied any major depressive  symptoms and she denied any suicidal ideation.  She was a little upset  because of having to be in the hospital.  Once the patient took a shower and was able to ambulate she felt  significantly better.  The patient was advised to continue treatment with  her antidepressant.  She was advised to follow up with her primary care  physician as stated above.                                                Elliot Cousin, M.D.    DF/MEDQ  D:  02/06/2003  T:  02/07/2003  Job:  [id number]   cc:   Mosetta Putt, M.D.  [address] [place]  [place] [address]  Fax: [phone]

## 2010-10-13 NOTE — Assessment & Plan Note (Signed)
[facility]                         GASTROENTEROLOGY OFFICE NOTE   NAME:Arnold, Anna                        MRN:          [id number]  DATE:05/20/2006                            DOB:          [birth year]    Anna Arnold is a 60 year old white female with irritable  syndrome/nonulcer dyspepsia presenting with recurrent epigastric pain.  She has a history of acid reflux.  Last upper endoscopy in June 22, 2005 was done because of anemia.  She had reflux esophagitis, mild  esophageal stricture and hiatal hernia.  She is now on combination of  Protonix 40 mg twice a day and Carafate slurry.  She had exacerbation of  her symptoms when she went to a Christmas party with her husband and had  some fried food.  The Carafate slurry seemed to help at 1 gm 4 times a  day.  She is now much better, but would like to switch back to  omeprazole because her co pay for Protonix is 50 dollars.  She has been  on low-carbohydrate diet, and has been effectively losing weight, all  together 20 pounds in the last year.   PHYSICAL EXAMINATION:  Blood pressure 120/70.  Pulse 70.  Weight 136.4  pounds.  She was alert, oriented and in no distress.  LUNGS:  Clear to auscultation.  COR:  Normal S1 and normal S2.  ABDOMEN:  Was soft, but tender in the epigastrium in the midline above  the umbilicus.  Normal left upper and lower quadrants, and right upper  and lower quadrants.  No distention.  RECTAL:  Exam not repeated.   IMPRESSION:  A 60 year old white female with known nonulcer dyspepsia  and gastroesophageal reflux exacerbated by certain foods.   PLAN:  1. Refilled Carafate slurry, generic is okay.  2. Switch to omeprazole 20 mg twice a day.  3. I will see her on a p.r.n. basis.     Hedwig Morton. Juanda Chance, MD  Electronically Signed    DMB/MedQ  DD: 05/20/2006  DT: 05/20/2006  Job #: [id number]   cc:   Mosetta Putt, M.D.

## 2010-10-13 NOTE — H&P (Signed)
NAME:  Anna Arnold, Anna Arnold                         ACCOUNT NO.:  [id number]   MEDICAL RECORD NO.:  [id number]                   PATIENT TYPE:  INP   LOCATION:  2041                                 FACILITY:  [facility]   PHYSICIAN:  Elliot Cousin, M.D.                 DATE OF BIRTH:  1951-01-12   DATE OF ADMISSION:  01/29/2003  DATE OF DISCHARGE:                                HISTORY & PHYSICAL   PRIMARY CARE PHYSICIAN:  Mosetta Putt, M.D.   CHIEF COMPLAINT:  Right lower extremity pain and swelling secondary to a  DVT.   HISTORY OF PRESENT ILLNESS:  Ms. Anna Arnold is a 60 year old patient with a  past medical history significant for depression, migraines and hypertension,  who presented to her primary care physician, Dr. Duaine Dredge, today with a one-  to two-week history of right lower leg pain.  The pain was mild-to-moderate  approximately a week or two ago, however, it has increased to severe pain.  The right leg is now hot, more swollen and heavy-feeling.  She denies any  recent long trips.  She denies any previous history of blood clots.  She  does have a family history of a blood clot in her maternal grandmother and  also in her mother where she believes there was a blood clot in her brain.  The patient has also been on Prempro for several years.  Her mammogram in  March of 2004 was within normal limits.   When the patient was evaluated by Dr. Elliot Gurney, he ordered a venous  Doppler study of her right leg at [facility].  The results were  positive for thrombus in the right posterior tibial vein, visualized from  the ankle to the mid-calf.  The patient was therefore admitted for  evaluation and management of the right lower extremity DVT.   REVIEW OF SYSTEMS:  The patient's review of systems is positive for a recent  wet cough, chronic constipation, chronic pain in her neck from arthritis,  chronic headache, and occasional shortness of breath, especially over the  past two to three weeks when singing.  Her review of systems is negative for  chest pain, pleurisy, abdominal pain, nausea, vomiting, diarrhea, blood in  her stools, melena, dysuria and swelling in her left leg.   PAST MEDICAL HISTORY:  1. Hypertension.  2. Chronic migraines.  3. Anxiety.  4. Depression.  5. Sleep apnea (not on CPAP).  6. Glaucoma bilaterally.  7. Gastroesophageal reflux disease.  8. Fibromyalgia.  9. Status post C-section with pregnancy.  10.      Allergic rhinitis.  11.      Cervical disk DJD.   MEDICATIONS:  1. Effexor XR 75 mg once daily.  2. Wellbutrin SR 150 mg daily.  3. BuSpar HCl 15 mg b.i.d.  4. Verapamil 240 mg daily.  5. Prempro 0.45/1.5 mg daily.  6. Xalatan ophthalmic drops 0.005% one  drop in each eye at bedtime.  7. Rhinocort Aqua one spray in each nostril twice daily (b.i.d.).  8. Maxalt 10 mg as needed for migraines.  9. Aciphex 20 mg daily.  10.      Calcium carbonate 600 mg b.i.d.  11.      Nasal spray daily as needed.   ALLERGIES:  The patient has no known drug allergies, however, she does have  intolerances to AUGMENTIN, BIAXIN and ERYTHROMYCIN.   SOCIAL HISTORY:  The patient is married and [place]  She has one daughter 59 years old.  She is  employed as a Librarian, academic.  She denies alcohol, drug and  tobacco use.   FAMILY HISTORY:  Her mother died at 55 years of age secondary to lung  cancer.  She did have a blood clot in her brain.  Her father is 71 years  of age and has hypertension, angina, with a history of coronary artery  disease.  She has a brother who is 51 years of age and has depression  glaucoma but no history of blood clots.  She has a brother who is 46 years  of age and healthy with no history of blood clots.   EXAMINATION:  VITAL SIGNS:  Temperature 99.3, pulse 73, blood pressure  127/79, oxygen saturation on room air 99%.  GENERAL:  The patient is a  pleasant, middle-aged, well-nourished Caucasian  woman who is currently lying in bed in no acute distress.  HEENT:  Head is normocephalic, nontraumatic.  Pupils are equal, round and  reactive to light.  Extraocular movements intact.  Conjunctivae are clear.  Sclerae are white.  External canals bilaterally of both of her ears are  clear.  Nasal mucosa is moist without any drainage.  Oropharynx has moist  mucous membranes, no posterior exudates or erythema.  Teeth are in good  repair/condition.  NECK:  Neck is supple.  No adenopathy, no thyromegaly, no bruit.  LUNGS:  Lungs are mostly clear, however, there is a rare crackle in the  right-greater-than-left base.  HEART:  S1 and S2, with no murmurs, rubs, or gallops.  ABDOMEN:  Positive bowel sounds.  Soft, nontender and nondistended.  No  hepatosplenomegaly and no masses palpated.  EXTREMITIES:  The patient's pedal pulses are 2+ bilaterally.  She has an  approximately 5- to 6-cm area of erythema of the right lower pretibial area  that is very warm and tender to palpation.  She has mild 1+ edema of the  right lower extremity.  The left lower extremity has no edema, no  tenderness, and no erythema.  No other joint abnormalities or skin  abnormalities seen.  NEUROLOGIC:  The patient is alert and oriented x3.  Cranial nerves II-XII  are intact.  Strength is intact throughout.  Sensation is intact throughout.   ADMISSION LABORATORIES:  Right lower extremity Doppler study per  [facility], outpatient report on January 29, 2003:  The results revealed right posterior tibial vein with thrombus  visualized from the ankle to the mid-calf, no evidence of thrombus in the  popliteal or greater saphenous veins, multiple varicosities were visualized  with no evidence of thrombosis.   EKG:  Normal sinus rhythm.  PT 13.1, INR 1.0, PTT 27.  Sodium 138, potassium 3.2, chloride 105, CO2 27,  glucose 126, BUN 14, creatinine 1.0,  total bilirubin 0.5, alkaline  phosphatase 61, SGOT 27, SGPT 25, total protein  6.5, albumin 3.6, calcium  8.9.  WBC 4.1, hemoglobin 13.7, hematocrit 40.0, MCV 89.9, platelets are  209,000.   ASSESSMENT:  1. Right lower extremity deep venous thrombosis.  The only obvious risk     factor for this deep venous thrombosis is hormone replacement therapy     with Prempro.  Certainly, hypercoagulable disorders are also in the     differential diagnosis, however, in the setting of an acute deep venous     thrombosis, assessing the antithrombin III, protein C and protein S     antibodies is not diagnostic.  2. Hypokalemia.  No obvious reason for the patient's potassium level to be     mildly low, however, it will need to be repleted.  3. Hypertension.  The patient's blood pressure is well within normal limits.  4. Chronic anxiety with depression.  The patient will be maintained on her     anxiolytic and antidepressant.  5. Mild nonproductive cough.  The patient will need to be assessed with a     chest x-ray.   PLAN:  1. The patient will be admitted to a telemetry bed.  2. She has been started on Lovenox 1 mg/kg subcu q.12 h.  Tomorrow, we will     add Coumadin at 5 mg daily and then titrate per pharmacy protocol.  3. Pain medication with hydrocodone and/or Tylenol as needed.  4. Keep legs elevated.  5. Obtain a preliminary chest x-ray, PA and lateral.  We will hold on CT     scan of the chest to rule out a PE, given that the patient does not have     chest pain and that she is oxygenating 99% on room air.  6. We will check homocysteine level as well as anticardiolipin antibodies.                                                Elliot Cousin, M.D.    DF/MEDQ  D:  01/29/2003  T:  01/30/2003  Job:  [id number]   cc:   Mosetta Putt, M.D.  [address] [place]  [place] [address]  Fax: [phone]

## 2010-10-13 NOTE — H&P (Signed)
   NAME:  Anna Arnold, Anna Arnold                         ACCOUNT NO.:  [id number]   MEDICAL RECORD NO.:  [id number]                   PATIENT TYPE:  INP   LOCATION:  2041                                 FACILITY:  [facility]   PHYSICIAN:  Elliot Cousin, M.D.                 DATE OF BIRTH:  [birth year]   DATE OF ADMISSION:  01/29/2003  DATE OF DISCHARGE:                                HISTORY & PHYSICAL   ADDENDUM:   PLAN:  The Prempro will be discontinued.  Elliot Cousin, M.D.    DF/MEDQ  D:  01/29/2003  T:  01/30/2003  Job:  [id number]   cc:   Mosetta Putt, M.D.  [address] [place]  [place] [address]  Fax: [phone]

## 2010-11-16 ENCOUNTER — Ambulatory Visit
Admission: RE | Admit: 2010-11-16 | Discharge: 2010-11-16 | Disposition: A | Discharge status: Home / Self Care | Payer: BC Managed Care – PPO | Source: Ambulatory Visit | Attending: Family Medicine | Admitting: Family Medicine

## 2010-11-16 ENCOUNTER — Other Ambulatory Visit: Payer: Self-pay | Admitting: Family Medicine

## 2010-11-16 DIAGNOSIS — M79603 Pain in arm, unspecified: Secondary | ICD-10-CM

## 2010-12-13 ENCOUNTER — Other Ambulatory Visit: Payer: Self-pay | Admitting: Family Medicine

## 2010-12-13 DIAGNOSIS — M542 Cervicalgia: Secondary | ICD-10-CM

## 2010-12-18 ENCOUNTER — Ambulatory Visit
Admission: RE | Admit: 2010-12-18 | Discharge: 2010-12-18 | Disposition: A | Discharge status: Home / Self Care | Payer: BC Managed Care – PPO | Source: Ambulatory Visit | Attending: Family Medicine | Admitting: Family Medicine

## 2010-12-18 DIAGNOSIS — M542 Cervicalgia: Secondary | ICD-10-CM

## 2011-01-05 ENCOUNTER — Other Ambulatory Visit: Payer: Self-pay | Admitting: Family Medicine

## 2011-01-05 ENCOUNTER — Ambulatory Visit
Admission: RE | Admit: 2011-01-05 | Discharge: 2011-01-05 | Disposition: A | Discharge status: Home / Self Care | Payer: BC Managed Care – PPO | Source: Ambulatory Visit | Attending: Family Medicine | Admitting: Family Medicine

## 2011-01-05 DIAGNOSIS — M545 Low back pain, unspecified: Secondary | ICD-10-CM

## 2011-05-29 HISTORY — PX: GLAUCOMA SURGERY: SHX656

## 2011-06-25 ENCOUNTER — Other Ambulatory Visit: Payer: Self-pay | Admitting: Obstetrics

## 2011-06-25 DIAGNOSIS — Z1231 Encounter for screening mammogram for malignant neoplasm of breast: Secondary | ICD-10-CM

## 2011-07-30 ENCOUNTER — Ambulatory Visit
Admission: RE | Admit: 2011-07-30 | Discharge: 2011-07-30 | Disposition: A | Discharge status: Home / Self Care | Payer: BC Managed Care – PPO | Source: Ambulatory Visit | Attending: Obstetrics | Admitting: Obstetrics

## 2011-07-30 DIAGNOSIS — Z1231 Encounter for screening mammogram for malignant neoplasm of breast: Secondary | ICD-10-CM

## 2011-11-02 ENCOUNTER — Other Ambulatory Visit: Payer: Self-pay | Admitting: Family Medicine

## 2011-11-02 DIAGNOSIS — R630 Anorexia: Secondary | ICD-10-CM

## 2011-11-02 DIAGNOSIS — R0600 Dyspnea, unspecified: Secondary | ICD-10-CM

## 2011-11-02 DIAGNOSIS — R110 Nausea: Secondary | ICD-10-CM

## 2011-11-05 ENCOUNTER — Ambulatory Visit
Admission: RE | Admit: 2011-11-05 | Discharge: 2011-11-05 | Disposition: A | Discharge status: Home / Self Care | Payer: BC Managed Care – PPO | Source: Ambulatory Visit | Attending: Family Medicine | Admitting: Family Medicine

## 2011-11-05 DIAGNOSIS — R0600 Dyspnea, unspecified: Secondary | ICD-10-CM

## 2011-11-05 DIAGNOSIS — R110 Nausea: Secondary | ICD-10-CM

## 2011-11-05 DIAGNOSIS — R630 Anorexia: Secondary | ICD-10-CM

## 2011-12-05 ENCOUNTER — Ambulatory Visit
Admission: RE | Admit: 2011-12-05 | Discharge: 2011-12-05 | Disposition: A | Discharge status: Home / Self Care | Payer: BC Managed Care – PPO | Source: Ambulatory Visit | Attending: Family Medicine | Admitting: Family Medicine

## 2011-12-05 ENCOUNTER — Other Ambulatory Visit: Payer: Self-pay | Admitting: Family Medicine

## 2011-12-05 DIAGNOSIS — R0600 Dyspnea, unspecified: Secondary | ICD-10-CM

## 2012-04-14 ENCOUNTER — Ambulatory Visit
Admission: RE | Admit: 2012-04-14 | Discharge: 2012-04-14 | Disposition: A | Discharge status: Home / Self Care | Payer: BC Managed Care – PPO | Source: Ambulatory Visit | Attending: Family Medicine | Admitting: Family Medicine

## 2012-04-14 ENCOUNTER — Other Ambulatory Visit: Payer: Self-pay | Admitting: Family Medicine

## 2012-04-14 DIAGNOSIS — R0602 Shortness of breath: Secondary | ICD-10-CM

## 2012-06-10 ENCOUNTER — Other Ambulatory Visit: Payer: Self-pay | Admitting: Family Medicine

## 2012-06-10 ENCOUNTER — Other Ambulatory Visit: Payer: Self-pay | Admitting: Obstetrics

## 2012-06-10 DIAGNOSIS — Z1231 Encounter for screening mammogram for malignant neoplasm of breast: Secondary | ICD-10-CM

## 2012-06-13 ENCOUNTER — Other Ambulatory Visit: Payer: Self-pay | Admitting: Family Medicine

## 2012-06-13 ENCOUNTER — Ambulatory Visit
Admission: RE | Admit: 2012-06-13 | Discharge: 2012-06-13 | Disposition: A | Discharge status: Home / Self Care | Payer: BC Managed Care – PPO | Source: Ambulatory Visit | Attending: Family Medicine | Admitting: Family Medicine

## 2012-06-13 DIAGNOSIS — M545 Low back pain, unspecified: Secondary | ICD-10-CM

## 2012-06-13 DIAGNOSIS — R635 Abnormal weight gain: Secondary | ICD-10-CM

## 2012-07-31 ENCOUNTER — Ambulatory Visit
Admission: RE | Admit: 2012-07-31 | Discharge: 2012-07-31 | Disposition: A | Discharge status: Home / Self Care | Payer: BC Managed Care – PPO | Source: Ambulatory Visit | Attending: Family Medicine | Admitting: Family Medicine

## 2012-07-31 DIAGNOSIS — Z1231 Encounter for screening mammogram for malignant neoplasm of breast: Secondary | ICD-10-CM

## 2012-12-31 ENCOUNTER — Other Ambulatory Visit: Payer: Self-pay | Admitting: Obstetrics & Gynecology

## 2012-12-31 DIAGNOSIS — M8580 Other specified disorders of bone density and structure, unspecified site: Secondary | ICD-10-CM

## 2013-05-05 ENCOUNTER — Ambulatory Visit
Admission: RE | Admit: 2013-05-05 | Discharge: 2013-05-05 | Disposition: A | Discharge status: Home / Self Care | Payer: BC Managed Care – PPO | Source: Ambulatory Visit | Attending: Obstetrics & Gynecology | Admitting: Obstetrics & Gynecology

## 2013-05-05 DIAGNOSIS — M8580 Other specified disorders of bone density and structure, unspecified site: Secondary | ICD-10-CM

## 2013-07-01 ENCOUNTER — Other Ambulatory Visit: Payer: Self-pay

## 2013-07-01 DIAGNOSIS — Z1231 Encounter for screening mammogram for malignant neoplasm of breast: Secondary | ICD-10-CM

## 2013-08-04 ENCOUNTER — Ambulatory Visit
Admission: RE | Admit: 2013-08-04 | Discharge: 2013-08-04 | Disposition: A | Discharge status: Home / Self Care | Payer: BC Managed Care – PPO | Source: Ambulatory Visit

## 2013-08-04 DIAGNOSIS — Z1231 Encounter for screening mammogram for malignant neoplasm of breast: Secondary | ICD-10-CM

## 2014-01-01 ENCOUNTER — Other Ambulatory Visit: Payer: Self-pay | Admitting: Family Medicine

## 2014-01-01 DIAGNOSIS — M25559 Pain in unspecified hip: Secondary | ICD-10-CM

## 2014-01-03 ENCOUNTER — Ambulatory Visit
Admission: RE | Admit: 2014-01-03 | Discharge: 2014-01-03 | Disposition: A | Discharge status: Home / Self Care | Payer: BC Managed Care – PPO | Source: Ambulatory Visit | Attending: Family Medicine | Admitting: Family Medicine

## 2014-01-03 DIAGNOSIS — M25559 Pain in unspecified hip: Secondary | ICD-10-CM

## 2014-11-25 ENCOUNTER — Ambulatory Visit (INDEPENDENT_AMBULATORY_CARE_PROVIDER_SITE_OTHER): Payer: 59

## 2014-11-25 DIAGNOSIS — R42 Dizziness and giddiness: Secondary | ICD-10-CM

## 2014-12-10 ENCOUNTER — Emergency Department (HOSPITAL_COMMUNITY): Payer: 59

## 2014-12-10 ENCOUNTER — Emergency Department (HOSPITAL_COMMUNITY)
Admission: EM | Admit: 2014-12-10 | Discharge: 2014-12-10 | Disposition: A | Discharge status: Home / Self Care | Payer: 59 | Attending: Emergency Medicine | Admitting: Emergency Medicine

## 2014-12-10 DIAGNOSIS — R200 Anesthesia of skin: Secondary | ICD-10-CM | POA: Insufficient documentation

## 2014-12-10 DIAGNOSIS — I10 Essential (primary) hypertension: Secondary | ICD-10-CM | POA: Diagnosis not present

## 2014-12-10 DIAGNOSIS — H6123 Impacted cerumen, bilateral: Secondary | ICD-10-CM

## 2014-12-10 DIAGNOSIS — Z7982 Long term (current) use of aspirin: Secondary | ICD-10-CM | POA: Diagnosis not present

## 2014-12-10 DIAGNOSIS — K219 Gastro-esophageal reflux disease without esophagitis: Secondary | ICD-10-CM | POA: Insufficient documentation

## 2014-12-10 DIAGNOSIS — Z8639 Personal history of other endocrine, nutritional and metabolic disease: Secondary | ICD-10-CM | POA: Diagnosis not present

## 2014-12-10 DIAGNOSIS — R61 Generalized hyperhidrosis: Secondary | ICD-10-CM | POA: Diagnosis not present

## 2014-12-10 DIAGNOSIS — Z79899 Other long term (current) drug therapy: Secondary | ICD-10-CM | POA: Insufficient documentation

## 2014-12-10 DIAGNOSIS — R110 Nausea: Secondary | ICD-10-CM | POA: Diagnosis not present

## 2014-12-10 DIAGNOSIS — R42 Dizziness and giddiness: Secondary | ICD-10-CM | POA: Diagnosis present

## 2014-12-10 LAB — DIFFERENTIAL
Basophils Absolute: 0 10*3/uL (ref 0.0–0.1)
Basophils Relative: 1 % (ref 0–1)
Eosinophils Absolute: 0.1 10*3/uL (ref 0.0–0.7)
Eosinophils Relative: 1 % (ref 0–5)
Lymphocytes Relative: 27 % (ref 12–46)
Lymphs Abs: 1.4 10*3/uL (ref 0.7–4.0)
Monocytes Absolute: 0.4 10*3/uL (ref 0.1–1.0)
Monocytes Relative: 8 % (ref 3–12)
Neutro Abs: 3.2 10*3/uL (ref 1.7–7.7)
Neutrophils Relative %: 63 % (ref 43–77)

## 2014-12-10 LAB — I-STAT CHEM 8, ED
BUN: 21 mg/dL — ABNORMAL HIGH (ref 6–20)
Calcium, Ion: 1.2 mmol/L (ref 1.13–1.30)
Chloride: 103 mmol/L (ref 101–111)
Creatinine, Ser: 0.9 mg/dL (ref 0.44–1.00)
Glucose, Bld: 104 mg/dL — ABNORMAL HIGH (ref 65–99)
HCT: 44 % (ref 36.0–46.0)
Hemoglobin: 15 g/dL (ref 12.0–15.0)
Potassium: 3.6 mmol/L (ref 3.5–5.1)
Sodium: 139 mmol/L (ref 135–145)
TCO2: 24 mmol/L (ref 0–100)

## 2014-12-10 LAB — CBC
HCT: 40.3 % (ref 36.0–46.0)
Hemoglobin: 13.9 g/dL (ref 12.0–15.0)
MCH: 30.3 pg (ref 26.0–34.0)
MCHC: 34.5 g/dL (ref 30.0–36.0)
MCV: 88 fL (ref 78.0–100.0)
Platelets: 216 10*3/uL (ref 150–400)
RBC: 4.58 MIL/uL (ref 3.87–5.11)
RDW: 12.9 % (ref 11.5–15.5)
WBC: 5.1 10*3/uL (ref 4.0–10.5)

## 2014-12-10 LAB — URINALYSIS, ROUTINE W REFLEX MICROSCOPIC
Bilirubin Urine: NEGATIVE
Glucose, UA: NEGATIVE mg/dL
Hgb urine dipstick: NEGATIVE
Ketones, ur: NEGATIVE mg/dL
Leukocytes, UA: NEGATIVE
Nitrite: NEGATIVE
Protein, ur: NEGATIVE mg/dL
Specific Gravity, Urine: 1.004 — ABNORMAL LOW (ref 1.005–1.030)
Urobilinogen, UA: 0.2 mg/dL (ref 0.0–1.0)
pH: 6.5 (ref 5.0–8.0)

## 2014-12-10 LAB — I-STAT TROPONIN, ED: Troponin i, poc: 0 ng/mL (ref 0.00–0.08)

## 2014-12-10 LAB — COMPREHENSIVE METABOLIC PANEL
ALT: 21 U/L (ref 14–54)
AST: 27 U/L (ref 15–41)
Albumin: 4.1 g/dL (ref 3.5–5.0)
Alkaline Phosphatase: 71 U/L (ref 38–126)
Anion gap: 9 (ref 5–15)
BUN: 19 mg/dL (ref 6–20)
CO2: 23 mmol/L (ref 22–32)
Calcium: 9.1 mg/dL (ref 8.9–10.3)
Chloride: 103 mmol/L (ref 101–111)
Creatinine, Ser: 0.96 mg/dL (ref 0.44–1.00)
GFR calc Af Amer: >60 mL/min (ref >60)
GFR calc non Af Amer: >60 mL/min (ref >60)
Glucose, Bld: 102 mg/dL — ABNORMAL HIGH (ref 65–99)
Potassium: 3.6 mmol/L (ref 3.5–5.1)
Sodium: 135 mmol/L (ref 135–145)
Total Bilirubin: 0.9 mg/dL (ref 0.3–1.2)
Total Protein: 6.9 g/dL (ref 6.5–8.1)

## 2014-12-10 LAB — RAPID URINE DRUG SCREEN, HOSP PERFORMED
Amphetamines: NOT DETECTED
Barbiturates: NOT DETECTED
Benzodiazepines: NOT DETECTED
Cocaine: NOT DETECTED
Opiates: NOT DETECTED
Tetrahydrocannabinol: NOT DETECTED

## 2014-12-10 LAB — APTT: aPTT: 26 seconds (ref 24–37)

## 2014-12-10 LAB — PROTIME-INR
INR: 1.03 (ref 0.00–1.49)
Prothrombin Time: 13.8 seconds (ref 11.6–15.2)

## 2014-12-10 MED ORDER — MECLIZINE HCL 25 MG PO TABS
25.0000 mg | ORAL_TABLET | Freq: Once | ORAL | Status: AC
  Administered 2014-12-10: 25 mg via ORAL
  Filled 2014-12-10: qty 1

## 2014-12-10 MED ORDER — MECLIZINE HCL 12.5 MG PO TABS: 12.5000 mg | ORAL_TABLET | Freq: Three times a day (TID) | ORAL | Status: DC | PRN

## 2014-12-10 MED ORDER — CARBAMIDE PEROXIDE 6.5 % OT SOLN: 5.0000 [drp] | Freq: Two times a day (BID) | OTIC | Status: DC

## 2014-12-10 NOTE — ED Notes (Signed)
Bed: WA06 Expected date:  Expected time:  Means of arrival:  Comments: EMS- elderly, nausea/dizziness

## 2014-12-10 NOTE — Discharge Instructions (Signed)
What is ear wax impaction? -- Ear wax impaction is when ear wax builds up enough to cause symptoms. Normally, ear wax helps to protect the insides of the ears and prevents injury or infection (figure 1). But having too much earwax can cause symptoms such as pain and trouble hearing. The medical term for ear wax is "cerumen."  Young children and older adults are more likely than others to have ear wax impaction.  What causes ear wax impaction? -- Several different things can cause ear wax impaction:  ?Diseases that affect the ear - Some health problems can affect the shape of the inside of the ear, and make it hard for wax to move out. For example, skin problems that cause skin cells to shed a lot can lead to wax build-up in the ears. ?A narrow ear canal (figure 2) - In some people, the ear canals are narrower than in others. These people might be more likely to have ear wax impaction. A person's ear canal can become narrower after an ear injury or after severe or multiple ear infections. ?Changes in ear wax and lining due to aging - As people get older, their ear wax gets harder and thicker. This makes it difficult for the wax to move out of the ear as it should. ?Bad ear-cleaning habits - Some people try to clean their ears using cotton swabs (Q-Tips) or other tools. This can actually push the wax deeper into the ear instead of getting it out. Over time, this can cause ear wax impaction. ?Making too much ear wax - Some people make more ear wax than others. This can happen when water gets trapped in the ear, or when the ear is injured. But some people have a lot of ear wax for no obvious reason. What are the symptoms of ear wax impaction? -- The symptoms include:  ?Trouble hearing ?Pain in the ear ?Hearing a ringing noise in the ear ?Feeling like the ear is blocked or plugged These symptoms can happen in one or both ears.  Should I see a doctor or nurse? -- Yes. If you or your child has any of  these symptoms, see a doctor or nurse. He or she can check the insides of the ears to figure out if the symptoms are caused by ear wax impaction or another problem, such as an ear infection.  Should I clean my (or my child's) ears at home? -- No. The insides of the ears do not usually need to be cleaned. Sticking things into the ears can push the wax in deeper and cause impaction.  How is ear wax impaction treated? -- There are several treatments to remove impacted ear wax. Doctors and nurses offer these treatments only to people who have bothersome symptoms. They do not recommend treatments for removing ear wax in people who have no symptoms, even if their ears are impacted.  In some cases, doctors and nurses will remove ear wax in people whose ears are impacted and who aren't able to let others know if they have symptoms or not. This can include young children, and people who are confused or have trouble speaking, including some older adults.  There are several different ways to remove ear wax:  ?Ear drops - Special ear drops can soften ear wax and help it to drain out. Ear drops are not usually safe for people with an ear infection or damage to the eardrum. ?Rinsing - In some cases, a doctor or nurse can remove  impacted ear wax by squirting water (or a special liquid) into the ear to rinse it out. ?Special tools - A doctor or nurse might use a special tool to remove ear wax. There are different types of tools that can do this safely. These include small sticks, hooks, and spoons. There are also tools that use suction to pull the wax out. "Ear candling" involves lighting one end of a hollow candle, and putting the other end in the ear. Ear candling is not recommended for ear wax removal. It does not work well and can cause injuries or burns.   Benign Positional Vertigo Vertigo means you feel like you or your surroundings are moving when they are not. Benign positional vertigo is the most common  form of vertigo. Benign means that the cause of your condition is not serious. Benign positional vertigo is more common in older adults. CAUSES  Benign positional vertigo is the result of an upset in the labyrinth system. This is an area in the middle ear that helps control your balance. This may be caused by a viral infection, head injury, or repetitive motion. However, often no specific cause is found. SYMPTOMS  Symptoms of benign positional vertigo occur when you move your head or eyes in different directions. Some of the symptoms may include:  Loss of balance and falls.  Vomiting.  Blurred vision.  Dizziness.  Nausea.  Involuntary eye movements (nystagmus). DIAGNOSIS  Benign positional vertigo is usually diagnosed by physical exam. If the specific cause of your benign positional vertigo is unknown, your caregiver may perform imaging tests, such as magnetic resonance imaging (MRI) or computed tomography (CT). TREATMENT  Your caregiver may recommend movements or procedures to correct the benign positional vertigo. Medicines such as meclizine, benzodiazepines, and medicines for nausea may be used to treat your symptoms. In rare cases, if your symptoms are caused by certain conditions that affect the inner ear, you may need surgery. HOME CARE INSTRUCTIONS   Follow your caregiver's instructions.  Move slowly. Do not make sudden body or head movements.  Avoid driving.  Avoid operating heavy machinery.  Avoid performing any tasks that would be dangerous to you or others during a vertigo episode.  Drink enough fluids to keep your urine clear or pale yellow. SEEK IMMEDIATE MEDICAL CARE IF:   You develop problems with walking, weakness, numbness, or using your arms, hands, or legs.  You have difficulty speaking.  You develop severe headaches.  Your nausea or vomiting continues or gets worse.  You develop visual changes.  Your family or friends notice any behavioral  changes.  Your condition gets worse.  You have a fever.  You develop a stiff neck or sensitivity to light. MAKE SURE YOU:   Understand these instructions.  Will watch your condition.  Will get help right away if you are not doing well or get worse. Document Released: 02/19/2006 Document Revised: 08/06/2011 Document Reviewed: 02/01/2011 [facility] Patient Information 2015 [place], [place]. This information is not intended to replace advice given to you by your health care provider. Make sure you discuss any questions you have with your health care provider.  Cerumen Impaction A cerumen impaction is when the wax in your ear forms a plug. This plug usually causes reduced hearing. Sometimes it also causes an earache or dizziness. Removing a cerumen impaction can be difficult and painful. The wax sticks to the ear canal. The canal is sensitive and bleeds easily. If you try to remove a heavy wax buildup with  a cotton tipped swab, you may push it in further. Irrigation with water, suction, and small ear curettes may be used to clear out the wax. If the impaction is fixed to the skin in the ear canal, ear drops may be needed for a few days to loosen the wax. People who build up a lot of wax frequently can use ear wax removal products available in your local drugstore. SEEK MEDICAL CARE IF:  You develop an earache, increased hearing loss, or marked dizziness. Document Released: 06/21/2004 Document Revised: 08/06/2011 Document Reviewed: 08/11/2009 [facility] Patient Information 2015 [place], [place]. This information is not intended to replace advice given to you by your health care provider. Make sure you discuss any questions you have with your health care provider.  Ear Drops You have been diagnosed with a condition requiring you to put drops of medicine into your outer ear. HOME CARE INSTRUCTIONS   Put drops in the affected ear as instructed. After putting the drops in, you will need to lie down  with the affected ear facing up for ten minutes so the drops will remain in the ear canal and run down and fill the canal. Continue using the ear drops for as long as directed by your health care provider.  Prior to getting up, put a cotton ball gently in your ear canal. Leave enough of the cotton ball out so it can be easily removed. Do not attempt to push this down into the canal with a cotton-tipped swab or other instrument.  Do not irrigate or wash out your ears if you have had a perforated eardrum or mastoid surgery, or unless instructed to do so by your health care provider.  Keep appointments with your health care provider as instructed.  Finish all medicine, or use for the length of time prescribed by your health care provider. Continue the drops even if your problem seems to be doing well after a couple days, or continue as instructed. SEEK MEDICAL CARE IF:  You become worse or develop increasing pain.  You notice any unusual drainage from your ear (particularly if the drainage has a bad smell).  You develop hearing difficulties.  You experience a serious form of dizziness in which you feel as if the room is spinning, and you feel nauseated (vertigo).  The outside of your ear becomes red or swollen or both. This may be a sign of an allergic reaction. MAKE SURE YOU:  Vertigo Vertigo means you feel like you or your surroundings are moving when they are not. Vertigo can be dangerous if it occurs when you are at work, driving, or performing difficult activities.  CAUSES  Vertigo occurs when there is a conflict of signals sent to your brain from the visual and sensory systems in your body. There are many different causes of vertigo, including:  Infections, especially in the inner ear.  A bad reaction to a drug or misuse of alcohol and medicines.  Withdrawal from drugs or alcohol.  Rapidly changing positions, such as lying down or rolling over in bed.  A migraine  headache.  Decreased blood flow to the brain.  Increased pressure in the brain from a head injury, infection, tumor, or bleeding. SYMPTOMS  You may feel as though the world is spinning around or you are falling to the ground. Because your balance is upset, vertigo can cause nausea and vomiting. You may have involuntary eye movements (nystagmus). DIAGNOSIS  Vertigo is usually diagnosed by physical exam. If the cause of  your vertigo is unknown, your caregiver may perform imaging tests, such as an MRI scan (magnetic resonance imaging). TREATMENT  Most cases of vertigo resolve on their own, without treatment. Depending on the cause, your caregiver may prescribe certain medicines. If your vertigo is related to body position issues, your caregiver may recommend movements or procedures to correct the problem. In rare cases, if your vertigo is caused by certain inner ear problems, you may need surgery. HOME CARE INSTRUCTIONS   Follow your caregiver's instructions.  Avoid driving.  Avoid operating heavy machinery.  Avoid performing any tasks that would be dangerous to you or others during a vertigo episode.  Tell your caregiver if you notice that certain medicines seem to be causing your vertigo. Some of the medicines used to treat vertigo episodes can actually make them worse in some people. SEEK IMMEDIATE MEDICAL CARE IF:   Your medicines do not relieve your vertigo or are making it worse.  You develop problems with talking, walking, weakness, or using your arms, hands, or legs.  You develop severe headaches.  Your nausea or vomiting continues or gets worse.  You develop visual changes.  A family member notices behavioral changes.  Your condition gets worse. MAKE SURE YOU:  Understand these instructions.  Will watch your condition.  Will get help right away if you are not doing well or get worse. Document Released: 02/21/2005 Document Revised: 08/06/2011 Document Reviewed:  11/30/2010 [facility] Patient Information 2015 [place], [place]. This information is not intended to replace advice given to you by your health care provider. Make sure you discuss any questions you have with your health care provider.   Understand these instructions.  Will watch your condition.  Will get help right away if you are not doing well or get worse. Document Released: 05/08/2001 Document Revised: 05/19/2013 Document Reviewed: 12/09/2012 [facility] Patient Information 2015 [place], [place]. This information is not intended to replace advice given to you by your health care provider. Make sure you discuss any questions you have with your health care provider.

## 2014-12-10 NOTE — ED Notes (Signed)
Per ems pt is from home, pt reports she had a GI bug in May, since then pt has been having episodes of nausea and dizziness, went to cardiologist last week- everything was fine. Is to go to neurologist next week. Today had an episode of dizziness/nausea,  left hand numbness, and dry mouth. Denies chest pain, denies SOB. Stroke screen negative. 18g R hand, 4mg  zofran IV, 57ml NS bolus. Hand numbness has subsided, nausea subsided.

## 2014-12-10 NOTE — ED Provider Notes (Signed)
CSN: [id number]     Arrival date & time 12/10/14  1606 History   First MD Initiated Contact with Patient 12/10/14 1606     Chief Complaint  Patient presents with  . Dizziness  . Nausea     (Consider location/radiation/quality/duration/timing/severity/associated sxs/prior Treatment) HPI   [name] is a 64 year old female with history of anxiety, depression, hypertension, hyperlipidemia, GERD, fibromyalgia, she presents with 2-3 months of increasing dizziness episodes with associated diaphoresis and mild transient altered mental status.  She states these episodes began after 2 illnesses and may, 1 sinus infection and 1 virus, and then these episodes began and have gradually increased in frequency and duration since then.  She usually has a dizziness episode when she is working, bending over with young children, or rolling over in bed.  Episodes are associated with sweats and dry mouth and sometimes with nausea, but she has not experienced any vomiting, chest pain, syncope, falls, or numbness or weakness.  Today she went out around 10:30 this morning to run errands, and she did not feel well all day.  She had persistent vertigo and went home early not completing her errands, and had associated diaphoresis, dry mouth and this time left upper extremity tingling.  Her husband is in the room and provides additional history, stating that although she has never had any asymmetry of her face, or unilateral weakness, during these episodes she has a slowed mental process with a slower pace of speech, but no slurring, LOC, or altered mental status.  She recently was evaluated by cardiology for the same complaint, reports wearing a Holter monitor without any significant findings.  She is scheduled to see an ENT at the end of this month, however with this prolonged episode today, she was alarmed and presented to the ER via EMS.   She has had chronic cerumen impaction bilaterally, complains of popping and  cracking in her ears, tinnitus, and decreased hearing.  The symptoms have been going on for years, and have been evaluated by her PCP. The patient and her husband state they have inquired about disimpaction, but it does not seem that PCP performs this procedure in his office.  Patient states she has multiple drops at home, but does not use them. Patient complains of multiple, vague and unique side effects to medications, described as hypersensitivity. For example she was unable to tolerate Prilosec.  The patient and her husband both seem very concerned about the medication she is ready on, their interactions, and the possibility that they are causing her new symptoms.   The patient denies rhinorrhea, nasal congestion, ear pain, sore throat, cough, shortness of breath, fever, chills.  No past medical history on file. No past surgical history on file. No family history on file. History  Substance Use Topics  . Smoking status: Not on file  . Smokeless tobacco: Not on file  . Alcohol Use: Not on file   OB History    No data available     Review of Systems 10 Systems reviewed and are negative for acute change except as noted in the HPI.      Allergies  Erythromycin  Home Medications   Prior to Admission medications   Medication Sig Start Date End Date Taking? Authorizing Provider  aspirin 81 MG tablet Take 81 mg by mouth at bedtime.   Yes Historical Provider, MD  BORON PO Take 1 mg by mouth daily.   Yes Historical Provider, MD  buPROPion (WELLBUTRIN SR) 100 MG  12 hr tablet Take 100 mg by mouth daily.   Yes Historical Provider, MD  Calcium 500 MG tablet Take 600 mg by mouth daily.   Yes Historical Provider, MD  calcium citrate-vitamin D (CALCIUM CITRATE +) 315-200 MG-UNIT per tablet Take 1 tablet by mouth 2 (two) times daily.   Yes Historical Provider, MD  Fish Oil-Cholecalciferol (FISH OIL + D3 PO) Take 1,400 mg by mouth daily.   Yes Historical Provider, MD  losartan (COZAAR) 100 MG  tablet Take 100 mg by mouth daily.   Yes Historical Provider, MD  Magnesium 80 MG TABS Take 1 tablet by mouth daily.   Yes Historical Provider, MD  MANGANESE PO Take 1 mg by mouth daily.   Yes Historical Provider, MD  Multiple Vitamins-Minerals (MULTIVITAMIN & MINERAL PO) Take 1 tablet by mouth daily.   Yes Historical Provider, MD  Multiple Vitamins-Minerals (ZINC PO) Take 10 mg by mouth daily.   Yes Historical Provider, MD  pantoprazole (PROTONIX) 40 MG tablet Take 40 mg by mouth 2 (two) times daily.   Yes Historical Provider, MD  SUMAtriptan (IMITREX) 100 MG tablet Take 50 mg by mouth every 2 (two) hours as needed for migraine. May repeat in 2 hours if headache persists or recurs.   Yes Historical Provider, MD  vitamin E (VITAMIN E) 400 UNIT capsule Take 400 Units by mouth daily.   Yes Historical Provider, MD  carbamide peroxide (DEBROX) 6.5 % otic solution Place 5 drops into both ears 2 (two) times daily. 12/10/14   Delsa Grana, PA-C  meclizine (ANTIVERT) 12.5 MG tablet Take 1 tablet (12.5 mg total) by mouth 3 (three) times daily as needed for dizziness. 12/10/14   Delsa Grana, PA-C   BP 145/86 mmHg  Pulse 75  Temp(Src) 98.4 F (36.9 C) (Oral)  Resp 19  SpO2 98% Physical Exam  Constitutional: She is oriented to person, place, and time. She appears well-developed and well-nourished. No distress.  HENT:  Head: Normocephalic and atraumatic.  Right Ear: External ear normal. No drainage or swelling.  Left Ear: External ear normal. No drainage or swelling.  Nose: Nose normal.  Mouth/Throat: Uvula is midline and oropharynx is clear and moist. Mucous membranes are not pale, dry and not cyanotic. No oropharyngeal exudate, posterior oropharyngeal edema, posterior oropharyngeal erythema or tonsillar abscesses.  Cerumen impaction bilaterally, obscuring most of external auditory canal and tympanic membrane.  No visible erythema, edema, exudate  Eyes: Conjunctivae and EOM are normal. Pupils are equal,  round, and reactive to light. Right eye exhibits no discharge. Left eye exhibits no discharge. No scleral icterus.  Neck: Normal range of motion. No JVD present. No tracheal deviation present. No thyromegaly present.  Cardiovascular: Normal rate, regular rhythm, normal heart sounds and intact distal pulses.  Exam reveals no gallop and no friction rub.   No murmur heard. Pulmonary/Chest: Effort normal and breath sounds normal. No respiratory distress. She has no wheezes. She has no rales. She exhibits no tenderness.  Abdominal: Soft. Bowel sounds are normal. She exhibits no distension and no mass. There is no tenderness. There is no rebound and no guarding.  Musculoskeletal: Normal range of motion. She exhibits no edema or tenderness.  Lymphadenopathy:    She has no cervical adenopathy.  Neurological: She is alert and oriented to person, place, and time. She has normal strength and normal reflexes. She displays no atrophy and no tremor. No cranial nerve deficit or sensory deficit. She exhibits normal muscle tone. She displays a negative Romberg sign.  She displays no seizure activity. Coordination and gait normal.  Normal sensation and strength bilaterally in upper and lower extremities. Cranial nerves 2-12 grossly normal No facial asymmetry no slurred speech Patient is alert and oriented 3 No nystagmus  Skin: Skin is warm and dry. No rash noted. She is not diaphoretic. No erythema. No pallor.  Psychiatric: She has a normal mood and affect. Her behavior is normal. Judgment and thought content normal.  Nursing note and vitals reviewed.   ED Course  Procedures (including critical care time) Labs Review Labs Reviewed  COMPREHENSIVE METABOLIC PANEL - Abnormal; Notable for the following:    Glucose, Bld 102 (*)    All other components within normal limits  URINALYSIS, ROUTINE W REFLEX MICROSCOPIC (NOT AT [facility]) - Abnormal; Notable for the following:    Specific Gravity, Urine 1.004 (*)    All  other components within normal limits  I-STAT CHEM 8, ED - Abnormal; Notable for the following:    BUN 21 (*)    Glucose, Bld 104 (*)    All other components within normal limits  PROTIME-INR  APTT  CBC  DIFFERENTIAL  URINE RAPID DRUG SCREEN, HOSP PERFORMED  PROLACTIN  I-STAT TROPOININ, ED  CBG MONITORING, ED    Imaging Review Ct Head Wo Contrast  12/10/2014   CLINICAL DATA:  Dizziness, blurred vision  EXAM: CT HEAD WITHOUT CONTRAST  TECHNIQUE: Contiguous axial images were obtained from the base of the skull through the vertex without intravenous contrast.  COMPARISON:  None.  FINDINGS: There is no evidence of mass effect, midline shift or extra-axial fluid collections. There is no evidence of a space-occupying lesion or intracranial hemorrhage. There is no evidence of a cortical-based area of acute infarction.  The ventricles and sulci are appropriate for the patient's age. The basal cisterns are patent.  Visualized portions of the orbits are unremarkable. The visualized portions of the paranasal sinuses and mastoid air cells are unremarkable.  The osseous structures are unremarkable.  IMPRESSION: No acute intracranial pathology.   Electronically Signed   By: Kathreen Devoid   On: 12/10/2014 17:26     EKG Interpretation None      MDM   Final diagnoses:  Vertigo  Numbness of left hand  Cerumen impaction, bilateral    Vertigo - ddx labyrinthitis, meniere's, BPPV, TIA LEU tinglings may be caused by known cervical spondlolysis/cervical radiculopathy, no sensory or strength deficit  Plan to work up for TIA/Stroke sx - Pt not having clear vertigo sx, associated with diaphoresis and brief slowed speech - CBC, BMP, urine, coags, head CT, EKG, orthostatics  All results unremarkable, head CT negative for any lesions, infarct, bleed or acute pathology, pt was not orthostatic.  I have attempted to remove cerumen bilaterally with curette, but pt did not tolerate, wax is firm, needs to be  softened first Dr. Wilson Singer has also seen and evaluated the pt.  The plan is for the pt to D/C home, she is stable, will treat vertigo with antivert, give cerumenolytics, and believe f/u with ENT is indicated. Patient was given meclizine in the ER, and was able to tolerate PO trial.  She is able to ambulate without difficulty.  Without any focal neurological deficit, no loss of consciousness, no altered mental status, do not believe her symptoms are concerning for TIA, believe symptoms can most accurately be attributed to ear etiology, which will be further worked up by ENT. Strict return precautions given to the patient and her husband, and verbally acknowledged.  Side effects of medication were thoroughly explained to the patient and acknowledged.  I advised the patient not to drive while still having vertigo episodes.    Medications  meclizine (ANTIVERT) tablet 25 mg (25 mg Oral Given 12/10/14 1901)   Filed Vitals:   12/10/14 1700 12/10/14 1750 12/10/14 1950  BP:  135/81 145/86  Pulse:  83 75  Temp: 98.3 F (36.8 C) 98.5 F (36.9 C) 98.4 F (36.9 C)  TempSrc:  Oral Oral  Resp:  19 19  SpO2:  95% 98%       Delsa Grana, PA-C 12/11/14 [place], MD 12/16/14 1103

## 2014-12-13 LAB — PROLACTIN: Prolactin: 10.6 ng/mL (ref 4.8–23.3)

## 2015-03-16 ENCOUNTER — Encounter: Payer: Self-pay | Admitting: Gastroenterology

## 2015-07-01 DIAGNOSIS — H401231 Low-tension glaucoma, bilateral, mild stage: Secondary | ICD-10-CM | POA: Diagnosis not present

## 2015-08-29 DIAGNOSIS — G4709 Other insomnia: Secondary | ICD-10-CM | POA: Diagnosis not present

## 2015-08-29 DIAGNOSIS — I10 Essential (primary) hypertension: Secondary | ICD-10-CM | POA: Diagnosis not present

## 2015-08-29 DIAGNOSIS — M62838 Other muscle spasm: Secondary | ICD-10-CM | POA: Diagnosis not present

## 2015-08-29 DIAGNOSIS — F325 Major depressive disorder, single episode, in full remission: Secondary | ICD-10-CM | POA: Diagnosis not present

## 2015-08-29 DIAGNOSIS — K219 Gastro-esophageal reflux disease without esophagitis: Secondary | ICD-10-CM | POA: Diagnosis not present

## 2015-08-29 DIAGNOSIS — B009 Herpesviral infection, unspecified: Secondary | ICD-10-CM | POA: Diagnosis not present

## 2015-08-29 DIAGNOSIS — G43009 Migraine without aura, not intractable, without status migrainosus: Secondary | ICD-10-CM | POA: Diagnosis not present

## 2015-08-31 ENCOUNTER — Other Ambulatory Visit: Payer: Self-pay

## 2015-08-31 DIAGNOSIS — Z1231 Encounter for screening mammogram for malignant neoplasm of breast: Secondary | ICD-10-CM

## 2015-09-06 ENCOUNTER — Ambulatory Visit
Admission: RE | Admit: 2015-09-06 | Discharge: 2015-09-06 | Disposition: A | Discharge status: Home / Self Care | Payer: Medicare Other | Source: Ambulatory Visit

## 2015-09-06 DIAGNOSIS — Z1231 Encounter for screening mammogram for malignant neoplasm of breast: Secondary | ICD-10-CM

## 2015-10-26 DIAGNOSIS — H401211 Low-tension glaucoma, right eye, mild stage: Secondary | ICD-10-CM | POA: Diagnosis not present

## 2015-10-26 DIAGNOSIS — H401221 Low-tension glaucoma, left eye, mild stage: Secondary | ICD-10-CM | POA: Diagnosis not present

## 2015-11-09 DIAGNOSIS — Z1159 Encounter for screening for other viral diseases: Secondary | ICD-10-CM | POA: Diagnosis not present

## 2015-11-09 DIAGNOSIS — I10 Essential (primary) hypertension: Secondary | ICD-10-CM | POA: Diagnosis not present

## 2015-11-09 DIAGNOSIS — Z23 Encounter for immunization: Secondary | ICD-10-CM | POA: Diagnosis not present

## 2015-11-09 DIAGNOSIS — M62838 Other muscle spasm: Secondary | ICD-10-CM | POA: Diagnosis not present

## 2015-11-09 DIAGNOSIS — R42 Dizziness and giddiness: Secondary | ICD-10-CM | POA: Diagnosis not present

## 2015-11-09 DIAGNOSIS — F325 Major depressive disorder, single episode, in full remission: Secondary | ICD-10-CM | POA: Diagnosis not present

## 2015-11-09 DIAGNOSIS — K219 Gastro-esophageal reflux disease without esophagitis: Secondary | ICD-10-CM | POA: Diagnosis not present

## 2015-11-09 DIAGNOSIS — G4709 Other insomnia: Secondary | ICD-10-CM | POA: Diagnosis not present

## 2015-11-09 DIAGNOSIS — G43009 Migraine without aura, not intractable, without status migrainosus: Secondary | ICD-10-CM | POA: Diagnosis not present

## 2015-11-09 DIAGNOSIS — M85852 Other specified disorders of bone density and structure, left thigh: Secondary | ICD-10-CM | POA: Diagnosis not present

## 2015-11-09 DIAGNOSIS — Z0000 Encounter for general adult medical examination without abnormal findings: Secondary | ICD-10-CM | POA: Diagnosis not present

## 2015-11-09 DIAGNOSIS — M8580 Other specified disorders of bone density and structure, unspecified site: Secondary | ICD-10-CM | POA: Diagnosis not present

## 2015-11-09 DIAGNOSIS — Z1211 Encounter for screening for malignant neoplasm of colon: Secondary | ICD-10-CM | POA: Diagnosis not present

## 2015-12-05 DIAGNOSIS — E7800 Pure hypercholesterolemia, unspecified: Secondary | ICD-10-CM | POA: Diagnosis not present

## 2015-12-05 DIAGNOSIS — R42 Dizziness and giddiness: Secondary | ICD-10-CM | POA: Diagnosis not present

## 2015-12-21 DIAGNOSIS — J342 Deviated nasal septum: Secondary | ICD-10-CM | POA: Diagnosis not present

## 2015-12-21 DIAGNOSIS — H6123 Impacted cerumen, bilateral: Secondary | ICD-10-CM | POA: Diagnosis not present

## 2015-12-21 DIAGNOSIS — J343 Hypertrophy of nasal turbinates: Secondary | ICD-10-CM | POA: Diagnosis not present

## 2015-12-21 DIAGNOSIS — J310 Chronic rhinitis: Secondary | ICD-10-CM | POA: Diagnosis not present

## 2016-01-04 DIAGNOSIS — M5136 Other intervertebral disc degeneration, lumbar region: Secondary | ICD-10-CM | POA: Diagnosis not present

## 2016-01-04 DIAGNOSIS — G4700 Insomnia, unspecified: Secondary | ICD-10-CM | POA: Diagnosis not present

## 2016-01-04 DIAGNOSIS — M542 Cervicalgia: Secondary | ICD-10-CM | POA: Diagnosis not present

## 2016-01-04 DIAGNOSIS — G4733 Obstructive sleep apnea (adult) (pediatric): Secondary | ICD-10-CM | POA: Diagnosis not present

## 2016-01-04 DIAGNOSIS — M545 Low back pain: Secondary | ICD-10-CM | POA: Diagnosis not present

## 2016-01-18 ENCOUNTER — Institutional Professional Consult (permissible substitution): Payer: Medicare Other | Admitting: Neurology

## 2016-01-31 DIAGNOSIS — R0781 Pleurodynia: Secondary | ICD-10-CM | POA: Diagnosis not present

## 2016-02-13 ENCOUNTER — Ambulatory Visit (INDEPENDENT_AMBULATORY_CARE_PROVIDER_SITE_OTHER): Payer: Medicare Other | Admitting: Neurology

## 2016-02-13 ENCOUNTER — Encounter: Payer: Self-pay | Admitting: Neurology

## 2016-02-13 VITALS — BP 142/86 | HR 68 | Resp 20 | Ht 60.75 in | Wt 128.0 lb

## 2016-02-13 DIAGNOSIS — M542 Cervicalgia: Secondary | ICD-10-CM

## 2016-02-13 DIAGNOSIS — G4700 Insomnia, unspecified: Secondary | ICD-10-CM | POA: Diagnosis not present

## 2016-02-13 DIAGNOSIS — R5383 Other fatigue: Secondary | ICD-10-CM | POA: Diagnosis not present

## 2016-02-13 DIAGNOSIS — G479 Sleep disorder, unspecified: Secondary | ICD-10-CM | POA: Diagnosis not present

## 2016-02-13 DIAGNOSIS — F5104 Psychophysiologic insomnia: Secondary | ICD-10-CM

## 2016-02-13 DIAGNOSIS — J322 Chronic ethmoidal sinusitis: Secondary | ICD-10-CM | POA: Diagnosis not present

## 2016-02-13 DIAGNOSIS — G478 Other sleep disorders: Secondary | ICD-10-CM | POA: Diagnosis not present

## 2016-02-13 DIAGNOSIS — G4750 Parasomnia, unspecified: Secondary | ICD-10-CM

## 2016-02-13 NOTE — Patient Instructions (Signed)
Melatonin oral solution What is this medicine? MELATONIN (mel uh TOH nin) is a dietary supplement. It is promoted to help maintain normal sleep patterns. The FDA has not approved this supplement for any medical use. This supplement may be used for other purposes; ask your health care provider or pharmacist if you have questions. This medicine may be used for other purposes; ask your health care provider or pharmacist if you have questions. What should I tell my health care provider before I take this medicine? They need to know if you have any of these conditions: -asthma -cancer -depression or mental illness -diabetes -hormone problems -if you often drink alcohol -immune system problems -liver disease -organ transplant -seizure disorder -an unusual or allergic reaction to melatonin, other medicines, foods, dyes, or preservatives -pregnant or trying to get pregnant -breast-feeding How should I use this medicine? Take this supplement by mouth with a glass of water. Do not take with food. Use a specially marked spoon or container to measure each dose. Ask your pharmacist if you do not have one. Household spoons are not accurate. This supplement is usually taken 1 or 2 hours before bedtime. After taking this supplement, limit your activities to those needed to prepare for bed. Follow the directions on the package labeling, or take as directed by your health care professional. Do not take this supplement more often than directed. Talk to your pediatrician regarding the use of this supplement in children. Special care may be needed. This supplement is not recommended for use in children without a prescription. Overdosage: If you think you have taken too much of this medicine contact a poison control center or emergency room at once. NOTE: This medicine is only for you. Do not share this medicine with others. What if I miss a dose? If you miss a dose, take it as soon as you can. If it is almost  time for your next dose, take only that dose. Do not take double or extra doses. What may interact with this medicine? Do not take this medicine with any of the following medications: -fluvoxamine -ramelteon -tasimelteon This medicine may also interact with the following medications: -alcohol -atazanavir -caffeine -carbamazepine -certain antibiotics like ciprofloxacin, enoxacin -certain medicines for depression, anxiety, or psychotic disturbances -cimetidine -female hormones, like estrogens and birth control pills, patches, rings, or injections -methoxsalen -nifedipine -other medications for sleep -other herbal or dietary supplements -phenobarbital -rifampin -smoking tobacco -tamoxifen -treatments for cancer, organ transplant, or immune disorders This list may not describe all possible interactions. Give your health care provider a list of all the medicines, herbs, non-prescription drugs, or dietary supplements you use. Also tell them if you smoke, drink alcohol, or use illegal drugs. Some items may interact with your medicine. What should I watch for while using this medicine? If you are already being treated for insomnia use this supplement only by your doctor's direction. This supplement may interfere with other treatments. See your doctor if your symptoms do not get better or if they get worse. You may get drowsy or dizzy. Do not drive, use machinery, or do anything that needs mental alertness until you know how this medicine affects you. Do not stand or sit up quickly, especially if you are an older patient. This reduces the risk of dizzy or fainting spells. Alcohol may interfere with the effect of this medicine. Avoid alcoholic drinks. Herbal or dietary supplements are not regulated like medicines. Rigid quality control standards are not required for dietary supplements. The purity and  strength of these products can vary. The safety and effect of this dietary supplement for a  certain disease or illness is not well known. This product is not intended to diagnose, treat, cure or prevent any disease. The Food and Drug Administration suggests the following to help consumers protect themselves: -Always read product labels and follow directions. -Natural does not mean a product is safe for humans to take. -Look for products that include USP after the ingredient name. This means that the manufacturer followed the standards of the [place] Pharmacopoeia. -Supplements made or sold by a nationally known food or drug company are more likely to be made under tight controls. You can write to the company for more information about how the product was made. What side effects may I notice from receiving this medicine? Side effects that you should report to your doctor or health care professional as soon as possible: -allergic reactions like skin rash, itching or hives, swelling of the face, lips, or tongue -breathing problems -change in sex drive or performance -confusion -depressed mood, irritable, or other changes in moods or behaviors -fast, irregular heartbeat -feeling faint or lightheaded, falls -irregular or missed menstrual periods -leakage of milk from the nipples in a person who is not breast-feeding -signs and symptoms of liver injury like dark yellow or brown urine; general ill feeling or flu-like symptoms; light-colored stools; loss of appetite; nausea; right upper belly pain; unusually weak or tired; yellowing of the eyes or skin -sleep-walking -trouble staying awake or alert during the day -unusual activities while you are still asleep like driving, eating, making phone calls -unusual bleeding or bruising Side effects that usually do not require medical attention (report to your doctor or health care professional if they continue or are bothersome): -dizziness -drowsiness -headache -tiredness -unusual dreams or nightmares -upset stomach This list may not describe all  possible side effects. Call your doctor for medical advice about side effects. You may report side effects to FDA at 1-800-FDA-1088. Where should I keep my medicine? Keep out of the reach of children. Store as directed on the package label. Throw away any unused supplement after the expiration date. NOTE: This sheet is a summary. It may not cover all possible information. If you have questions about this medicine, talk to your doctor, pharmacist, or health care provider.    2016, Elsevier/Gold Standard. (2013-12-29 14:01:14) Fatigue Fatigue is feeling tired all of the time, a lack of energy, or a lack of motivation. Occasional or mild fatigue is often a normal response to activity or life in general. However, long-lasting (chronic) or extreme fatigue may indicate an underlying medical condition. HOME CARE INSTRUCTIONS  Watch your fatigue for any changes. The following actions may help to lessen any discomfort you are feeling:  Talk to your health care provider about how much sleep you need each night. Try to get the required amount every night.  Take medicines only as directed by your health care provider.  Eat a healthy and nutritious diet. Ask your health care provider if you need help changing your diet.  Drink enough fluid to keep your urine clear or pale yellow.  Practice ways of relaxing, such as yoga, meditation, massage therapy, or acupuncture.  Exercise regularly.   Change situations that cause you stress. Try to keep your work and personal routine reasonable.  Do not abuse illegal drugs.  Limit alcohol intake to no more than 1 drink per day for nonpregnant women and 2 drinks per day for men. One  drink equals 12 ounces of beer, 5 ounces of wine, or 1 ounces of hard liquor.  Take a multivitamin, if directed by your health care provider. SEEK MEDICAL CARE IF:   Your fatigue does not get better.  You have a fever.   You have unintentional weight loss or gain.  You  have headaches.   You have difficulty:   Falling asleep.  Sleeping throughout the night.  You feel angry, guilty, anxious, or sad.   You are unable to have a bowel movement (constipation).   You skin is dry.   Your legs or another part of your body is swollen.  SEEK IMMEDIATE MEDICAL CARE IF:   You feel confused.   Your vision is blurry.  You feel faint or pass out.   You have a severe headache.   You have severe abdominal, pelvic, or back pain.   You have chest pain, shortness of breath, or an irregular or fast heartbeat.   You are unable to urinate or you urinate less than normal.   You develop abnormal bleeding, such as bleeding from the rectum, vagina, nose, lungs, or nipples.  You vomit blood.   You have thoughts about harming yourself or committing suicide.   You are worried that you might harm someone else.    This information is not intended to replace advice given to you by your health care provider. Make sure you discuss any questions you have with your health care provider.   Document Released: 03/11/2007 Document Revised: 06/04/2014 Document Reviewed: 09/15/2013 Elsevier Interactive Patient Education [employer].

## 2016-02-13 NOTE — Progress Notes (Signed)
SLEEP MEDICINE CLINIC   Provider:  Larey Seat, M D  Referring Provider: Hilbert Bible Primary Care Physician:  No primary care provider on file.  Chief Complaint  Patient presents with  . New Patient (Initial Visit)    insomnia, can't concentrate, irritability    HPI:  Anna Arnold is a 65 y.o. female , seen here as a referral  from [place] for A sleep duration. I have seen this patient for many years until approximately 4 years ago. Her primary care physician reported problems with insomnia, obstructive sleep apnea, irritability but also forgetfulness and concerned about cognitive function.  Chief complaint according to patient :  Anna Arnold underwent a sleep study at home in 2013 but previously and had seen her in 2007 and evaluated her on 12/14/2005 at the [facility] hardened sleep center. She was diagnosed with mild apnea AHI was 8.8 but exacerbated during REM to 34. Oxygen nadir was 80% and she was snoring.  The patient reports no problems going to sleep, but she feels exhausted throughout the day. Her Epworth sleepiness score was endorsed at 10 points and her fatigue severity is highly elevated at 50 points. She does not endorse a significant level of depression on the geriatric depression score 2 out of 15. She has used a fit bit and feels that it indicated that she gets only 3-5 hours of nocturnal sleep. She seems to be very restless throughout the night.  Sleep habits are as follows: She goes to bed between 10:15 and 11:00 usually she is asleep rather promptly. She shares a bedroom with her husband, the bedroom is cool and quiet and dark. She sleeps on only one pillow usually on her side. Her husband occasionally nudges her when she is snoring, and has tried to persuade her not to look at her fit bit has often. She has to go to the bathroom once or twice each night. On Saturdays and Sundays she seems to sleep longer and better than on week days. Her  husband set the alarm to 5:30 and usually she will wake up from that as well. She always feels that she needs another hour or 2 of sleep in the morning. She does not feel rested or restored fully refreshed. The week and seem better and the alarm is usually not ringing on those days. She will start her day in the morning with this a couple of coffee, and she will take breakfast at home. She will frequently wake up with headaches. Nasal congestion sinus congestions and a dry mouth are present in the morning.  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  Non smoker, non ETOH, caffeine- cup  Of coffee in AM, pepsi  Or iced tea before 2 PM, once in a while.     Review of Systems: Out of a complete 14 system review, the patient complains of only the following symptoms, and all other reviewed systems are negative. The patient endorsed insomnia as well as daytime sleepiness restless sleep, snoring, memory loss, confusion morning headaches occasional lightheadedness, aching muscles or joint pain, increased thirst, feeling hot or cold having blurred vision respiratory allergies and high fatigue. Her family history is positive for her father deceased of Alzheimer's disease, brother deceased of a heart attack and her daughter has frequent migraines and suffers from glaucoma. The Epworth score is endorsed at her primary care office was 12 points on August 9, her primary care physician also felt that the  patient had  Neck pain.  Epworth score  10 , Fatigue severity score 50  , depression score 2/15   Social History   Social History  . Marital status: Married    Spouse name: N/A  . Number of children: N/A  . Years of education: N/A   Occupational History  . Not on file.   Social History Main Topics  . Smoking status: Never Smoker  . Smokeless tobacco: Never Used  . Alcohol use No  . Drug use: Unknown  . Sexual activity: Not on file   Other Topics Concern  . Not on file    Social History Narrative  . No narrative on file    No family history on file.  No past medical history on file.  No past surgical history on file.  Current Outpatient Prescriptions  Medication Sig Dispense Refill  . acetaminophen (TYLENOL) 500 MG chewable tablet Chew 500 mg by mouth every 6 (six) hours as needed for pain.    [name] acyclovir (ZOVIRAX) 400 MG tablet Take 400 mg by mouth 3 (three) times daily. As needed for fever blisters    . aspirin 81 MG chewable tablet Chew 81 mg by mouth daily.    [name] BORON PO Take by mouth.    [name] buPROPion (WELLBUTRIN SR) 100 MG 12 hr tablet Take 100 mg by mouth daily.    . Calcium Carbonate-Vitamin D (CALCIUM 500 + D PO) Take by mouth.    . chlordiazePOXIDE (LIBRIUM) 5 MG capsule Take 5 mg by mouth at bedtime as needed for anxiety.    . Cholecalciferol (VITAMIN D3) 1000 units CAPS Take by mouth.    . cyclobenzaprine (FLEXERIL) 5 MG tablet Take 5 mg by mouth 3 (three) times daily as needed for muscle spasms.    [name] losartan (COZAAR) 50 MG tablet Take 50 mg by mouth daily.    . Magnesium 80 MG TABS Take by mouth.    [name] MANGANESE PO Take by mouth.    . pantoprazole (PROTONIX) 40 MG tablet Take 40 mg by mouth daily.    . SUMAtriptan (IMITREX) 100 MG tablet Take 100 mg by mouth every 2 (two) hours as needed for migraine. Take 1/2 tablet for migraine. May repeat in 2 hours if headache persists or recurs.    . Zinc 10 MG LOZG Use as directed in the mouth or throat.     No current facility-administered medications for this visit.     Allergies as of 02/13/2016 - Review Complete 02/13/2016  Allergen Reaction Noted  . Amitriptyline  02/13/2016  . Atenolol  02/13/2016  . Augmentin [amoxicillin-pot clavulanate]  02/13/2016  . Biaxin [clarithromycin]  02/13/2016  . Ceftin [cefuroxime axetil]  02/13/2016  . Chlorthalidone  02/13/2016  . Citalopram hydrobromide Other (See Comments) 02/13/2016  . Erythromycin  04/30/2008  . Fluoxetine Other (See Comments)  02/13/2016  . Lexapro [escitalopram oxalate]  02/13/2016  . Meloxicam  02/13/2016  . Potassium chloride  02/13/2016  . Pravastatin  02/13/2016  . Reglan [metoclopramide]  02/13/2016  . Sertraline hcl Other (See Comments) 02/13/2016  . Simvastatin  02/13/2016  . Singulair [montelukast sodium]  02/13/2016  . Viibryd [vilazodone hcl]  02/13/2016  . Zegerid [omeprazole-sodium bicarbonate]  02/13/2016  . Zegerid [omeprazole]  02/13/2016    Vitals: BP (!) 142/86   Pulse 68   Resp 20   Ht 5' 0.75" (1.543 m)   Wt 128 lb (58.1 kg)   BMI 24.38 kg/m  Last Weight:  Wt Readings  from Last 1 Encounters:  02/13/16 128 lb (58.1 kg)   [id number] mass index is 24.38 kg/m.     Last Height:   Ht Readings from Last 1 Encounters:  02/13/16 5' 0.75" (1.543 m)    Physical exam:  General: The patient is awake, alert and appears not in acute distress. The patient is well groomed. Head: Normocephalic, atraumatic. Neck is supple. Mallampati 2,  neck circumference 14 . Nasal airflow congested , TMJ is not  evident . Retrognathia is not seen.  Cardiovascular:  Regular rate and rhythm, without  murmurs or carotid bruit, and without distended neck veins. Respiratory: Lungs are clear to auscultation. Skin:  Without evidence of edema, or rash Trunk: BMI is 24 The patient's posture is erect   Neurologic exam : The patient is awake and alert, oriented to place and time.   Memory subjective described as impaired   MOCA: Montreal Cognitive Assessment  02/13/2016  Visuospatial/ Executive (0/5) 3  Naming (0/3) 3  Attention: Read list of digits (0/2) 2  Attention: Read list of letters (0/1) 1  Attention: Serial 7 subtraction starting at 100 (0/3) 3  Language: Repeat phrase (0/2) 1  Language : Fluency (0/1) 1  Abstraction (0/2) 2  Delayed Recall (0/5) 3  Orientation (0/6) 6  Total 25  Adjusted Score (based on education) 25     Attention span & concentration ability appears normal.  Speech is fluent,   without  dysarthria, dysphonia or aphasia.  Mood and affect are appropriate.  Cranial nerves: Pupils are equal and briskly reactive to light. Funduscopic exam without  evidence of pallor or edema. Extraocular movements  in vertical and horizontal planes intact and without nystagmus. Visual fields by finger perimetry are intact.Hearing to finger rub intact. Facial sensation intact to fine touch.Facial motor strength is symmetric and tongue and uvula move midline. Shoulder shrug was symmetrical.  Motor exam:   Normal tone, muscle bulk and symmetric strength in all extremities. Sensory:  Fine touch, pinprick and vibration were tested in all extremities. Proprioception tested in the upper extremities was normal. Coordination: Rapid alternating movements in the fingers/hands was normal. Finger-to-nose maneuver  normal without evidence of ataxia, dysmetria or tremor.  Gait and station: Patient walks without assistive device and is able unassisted to climb up to the exam table. Strength within normal limits.  Stance is stable and normal.  Toe and hell stand were tested .Tandem gait is unfragmented. Deep tendon reflexes: in the  upper and lower extremities are symmetric and intact.  The patient was advised of the nature of the diagnosed sleep disorder , the treatment options and risks for general a health and wellness arising from not treating the condition.    I spent more than 45  minutes of face to face time with the patient. Greater than 50% of time was spent in counseling and coordination of care. We have discussed the diagnosis and differential and I answered the patient's questions.     Assessment:  After physical and neurologic examination, review of laboratory studies,  Personal review of imaging studies, reports of other /same  Imaging studies ,  Results of polysomnography/ neurophysiology testing and pre-existing records as far as provided in visit., my assessment is   1) considering that the  patient suffered from a mild sleep apnea and has a rather normal MS LT she is at this time not suffering from an organic sleep disorder that is identified. However her studies are from 2007 and 2010 so I  understand her primary care concern about repeating them possibly. As to the restless i will have to have a attended sleep study to see if she actually moves if these movements are periodic and if these movements wake her up and caused arousals.  2) precipitate truly just measures movement or the absence thereof it is therefore not a measurement of sleep per se.  3) waking with a dry mouth, his headaches and having sinus congestion in spite of using a Netty pot and saline nasal spray, may speak for an ongoing chronic sinusitis. Try nasocort as  Dr Melene Plan evaluated.      Plan:  Treatment plan and additional workup :  SPLIT with Co2.  eviated septum. attended sleep study to see restlessness,  Insomnia medication prn, try melatonin to stay asleep. 3- 5 mg .  No loss of smell or taste, no cog-wheelig.     Asencion Partridge Yanely Mast MD  02/13/2016   CC: Michel [address]arrow, [place], [place] [id number]

## 2016-02-20 DIAGNOSIS — E7800 Pure hypercholesterolemia, unspecified: Secondary | ICD-10-CM | POA: Diagnosis not present

## 2016-02-20 DIAGNOSIS — R42 Dizziness and giddiness: Secondary | ICD-10-CM | POA: Diagnosis not present

## 2016-02-20 DIAGNOSIS — Z23 Encounter for immunization: Secondary | ICD-10-CM | POA: Diagnosis not present

## 2016-02-27 ENCOUNTER — Ambulatory Visit (INDEPENDENT_AMBULATORY_CARE_PROVIDER_SITE_OTHER): Payer: Medicare Other | Admitting: Neurology

## 2016-02-27 DIAGNOSIS — G4710 Hypersomnia, unspecified: Secondary | ICD-10-CM

## 2016-02-27 DIAGNOSIS — R5383 Other fatigue: Secondary | ICD-10-CM

## 2016-02-27 DIAGNOSIS — G4750 Parasomnia, unspecified: Secondary | ICD-10-CM

## 2016-02-27 DIAGNOSIS — G479 Sleep disorder, unspecified: Secondary | ICD-10-CM

## 2016-02-27 DIAGNOSIS — J322 Chronic ethmoidal sinusitis: Secondary | ICD-10-CM

## 2016-02-27 DIAGNOSIS — M542 Cervicalgia: Secondary | ICD-10-CM

## 2016-02-27 DIAGNOSIS — F5104 Psychophysiologic insomnia: Secondary | ICD-10-CM

## 2016-03-12 ENCOUNTER — Telehealth: Payer: Self-pay

## 2016-03-12 NOTE — Telephone Encounter (Signed)
I spoke to pt regarding her sleep study results. I advised her that neither sleep apnea nor PLM disorder was identified. Pt experienced poor sleep efficiency with fragmented sleep but no organic reason could be identified. I advised pt that she should consider a dedicated sleep psychology referral if insomnia is of clinical concern. Pt had a litany of complaints regarding her sleep study. She says that she has been calling since Friday and no one will pick up. She says that the [facility] was noisy the night of her sleep study, and she kept hearing ambulances and trains go by. She couldn't sleep on her side, even though the tech told her that she could, because the leads kept coming off. She declined a sleep psychology referral at this time and wants a follow up appt with Dr. Brett Fairy. An appt made for 03/14/16 at 9:30am. Pt verbalized understanding of results. Pt had no questions at this time but was encouraged to call back if questions arise.

## 2016-03-14 ENCOUNTER — Encounter: Payer: Self-pay | Admitting: Neurology

## 2016-03-14 ENCOUNTER — Ambulatory Visit (INDEPENDENT_AMBULATORY_CARE_PROVIDER_SITE_OTHER): Payer: Medicare Other | Admitting: Neurology

## 2016-03-14 VITALS — BP 142/88 | HR 76 | Resp 20 | Ht 60.0 in | Wt 129.0 lb

## 2016-03-14 DIAGNOSIS — G4701 Insomnia due to medical condition: Secondary | ICD-10-CM

## 2016-03-14 DIAGNOSIS — G8929 Other chronic pain: Secondary | ICD-10-CM

## 2016-03-14 NOTE — Progress Notes (Signed)
SLEEP MEDICINE CLINIC   Provider:  Larey Seat, M D  Referring Provider: No ref. provider found Primary Care Physician:  No primary care provider on file.  Chief Complaint  Patient presents with  . Follow-up    sleep study results    HPI:  Anna Arnold is a 65 y.o. female , seen here as a referral  from [place]  No ref. provider found for A sleep duration. I have seen this patient for many years until approximately 4 years ago. Her primary care physician reported problems with insomnia, obstructive sleep apnea, irritability but also forgetfulness and concerned about cognitive function.  Chief complaint according to patient :  Anna Arnold underwent a sleep study at home in 2013 but previously and had seen her in 2007 and evaluated her on 12/14/2005 at the [facility] hardened sleep center. She was diagnosed with mild apnea AHI was 8.8 but exacerbated during REM to 34. Oxygen nadir was 80% and she was snoring.  The patient reports no problems going to sleep, but she feels exhausted throughout the day. Her Epworth sleepiness score was endorsed at 10 points and her fatigue severity is highly elevated at 50 points. She does not endorse a significant level of depression on the geriatric depression score 2 out of 15. She has used a fit bit and feels that it indicated that she gets only 3-5 hours of nocturnal sleep. She seems to be very restless throughout the night.  Sleep habits are as follows: She goes to bed between 10:15 and 11:00 usually she is asleep rather promptly. She shares a bedroom with her husband, the bedroom is cool and quiet and dark. She sleeps on only one pillow usually on her side. Her husband occasionally nudges her when she is snoring, and has tried to persuade her not to look at her fit bit has often. She has to go to the bathroom once or twice each night. On Saturdays and Sundays she seems to sleep longer and better than on week days. Her husband set the alarm to 5:30  and usually she will wake up from that as well. She always feels that she needs another hour or 2 of sleep in the morning. She does not feel rested or restored fully refreshed. The week and seem better and the alarm is usually not ringing on those days. She will start her day in the morning with this a couple of coffee, and she will take breakfast at home. She will frequently wake up with headaches. Nasal congestion sinus congestions and a dry mouth are present in the morning.  The patient endorsed insomnia as well as daytime sleepiness restless sleep, snoring, memory loss, confusion morning headaches occasional lightheadedness, aching muscles or joint pain, increased thirst, feeling hot or cold having blurred vision respiratory allergies and high fatigue. Her family history is positive for her father deceased of Alzheimer's disease, brother deceased of a heart attack and her daughter has frequent migraines and suffers from glaucoma.   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   Interval history from 03/14/2016, I have the pleasure of seeing Anna Arnold today, who underwent a diagnostic polysomnogram on 02/27/2016. This resulted in no significant apnea with an AHI of only 0.3, REM AHI was 2.7, she did not have oxygen desaturations she had very few periodic limb movements, none  of them arousal causing. The meeting today to discuss the results and also the reasons for insomnia, which the patient attributes to back pain and discomfort. I would encourage her to see  physical therapy and maybe multi modalities in physical therapy to find relief.  Review of Systems: Out of a complete 14 system review, the patient complains of only the following symptoms, and all other reviewed systems are negative. The Epworth score is endorsed at her primary care office was 12 points on August  9, her primary care physician also felt that the patient had  Neck pain.  Epworth score  10 , Fatigue severity score 50  , depression score 1/15   Social History   Social History  . Marital status: Married    Spouse name: N/A  . Number of children: N/A  . Years of education: N/A   Occupational History  . Not on file.   Social History Main Topics  . Smoking status: Never Smoker  . Smokeless tobacco: Never Used  . Alcohol use No  . Drug use: Unknown  . Sexual activity: Not on file   Other Topics Concern  . Not on file   Social History Narrative  . No narrative on file    No family history on file.  No past medical history on file.  No past surgical history on file.  Current Outpatient Prescriptions  Medication Sig Dispense Refill  . acetaminophen (TYLENOL) 500 MG tablet Take 500 mg by mouth every 6 (six) hours as needed.    [name] acyclovir (ZOVIRAX) 400 MG tablet Take 400 mg by mouth 3 (three) times daily. As needed for fever blisters    . aspirin EC 81 MG tablet Take 81 mg by mouth daily.    [name] BORON PO Take by mouth.    [name] buPROPion (WELLBUTRIN SR) 100 MG 12 hr tablet Take 100 mg by mouth daily.    . Calcium Carbonate-Vitamin D (CALCIUM 500 + D PO) Take by mouth.    . chlordiazePOXIDE (LIBRIUM) 5 MG capsule Take 5 mg by mouth at bedtime as needed for anxiety.    . Cholecalciferol (VITAMIN D3) 1000 units CAPS Take by mouth.    . cyclobenzaprine (FLEXERIL) 5 MG tablet Take 5 mg by mouth 3 (three) times daily as needed for muscle spasms.    [name] losartan (COZAAR) 50 MG tablet Take 50 mg by mouth daily.    . Magnesium 80 MG TABS Take by mouth.    [name] MANGANESE PO Take by mouth.    . pantoprazole (PROTONIX) 40 MG tablet Take 40 mg by mouth daily.    . SUMAtriptan (IMITREX) 100 MG tablet Take 100 mg by mouth every 2 (two) hours as needed for migraine. Take 1/2 tablet for migraine. May repeat in 2 hours if headache persists or recurs.     No current facility-administered  medications for this visit.     Allergies as of 03/14/2016 - Review Complete 03/14/2016  Allergen Reaction Noted  . Amitriptyline  02/13/2016  . Atenolol  02/13/2016  . Augmentin [amoxicillin-pot clavulanate]  02/13/2016  . Biaxin [clarithromycin]  02/13/2016  . Ceftin [cefuroxime axetil]  02/13/2016  . Chlorthalidone  02/13/2016  . Citalopram hydrobromide Other (See Comments) 02/13/2016  . Erythromycin  04/30/2008  . Fluoxetine Other (See Comments) 02/13/2016  . Lexapro [escitalopram oxalate]  02/13/2016  . Meloxicam  02/13/2016  . Potassium chloride  02/13/2016  . Pravastatin  02/13/2016  . Reglan [metoclopramide]  02/13/2016  . Sertraline hcl  Other (See Comments) 02/13/2016  . Simvastatin  02/13/2016  . Singulair [montelukast sodium]  02/13/2016  . Viibryd [vilazodone hcl]  02/13/2016  . Zegerid [omeprazole-sodium bicarbonate]  02/13/2016  . Zegerid [omeprazole]  02/13/2016    Vitals: BP (!) 142/88   Pulse 76   Resp 20   Ht 5' (1.524 m)   Wt 129 lb (58.5 kg)   BMI 25.19 kg/m  Last Weight:  Wt Readings from Last 1 Encounters:  03/14/16 129 lb (58.5 kg)   [id number] mass index is 25.19 kg/m.     Last Height:   Ht Readings from Last 1 Encounters:  03/14/16 5' (1.524 m)    Physical exam:  General: The patient is awake, alert and appears not in acute distress. The patient is well groomed. Head: Normocephalic, atraumatic. Neck is supple. Mallampati 2,  neck circumference 14 . Nasal airflow congested , TMJ is not  evident . Retrognathia is not seen.  Cardiovascular:  Regular rate and rhythm, without  murmurs or carotid bruit, and without distended neck veins. Respiratory: Lungs are clear to auscultation. Skin:  Without evidence of edema, or rash Trunk: BMI is 24 The patient's posture is erect   Neurologic exam : The patient is awake and alert, oriented to place and time.   Memory subjective described as impaired   MOCA: Montreal Cognitive Assessment  02/13/2016    Visuospatial/ Executive (0/5) 3  Naming (0/3) 3  Attention: Read list of digits (0/2) 2  Attention: Read list of letters (0/1) 1  Attention: Serial 7 subtraction starting at 100 (0/3) 3  Language: Repeat phrase (0/2) 1  Language : Fluency (0/1) 1  Abstraction (0/2) 2  Delayed Recall (0/5) 3  Orientation (0/6) 6  Total 25  Adjusted Score (based on education) 25     Attention span & concentration ability appears normal.  Speech is fluent,  without  dysarthria, dysphonia or aphasia.  Mood and affect are appropriate.  Cranial nerves: Pupils are equal and briskly reactive to light. Funduscopic exam without  evidence of pallor or edema. Extraocular movements  in vertical and horizontal planes intact and without nystagmus. Visual fields by finger perimetry are intact.Hearing to finger rub intact. Facial sensation intact to fine touch.Facial motor strength is symmetric and tongue and uvula move midline. Shoulder shrug was symmetrical.  Motor exam:   Normal tone, muscle bulk and symmetric strength in all extremities. Sensory:  Fine touch, pinprick and vibration were tested in all extremities. Proprioception tested in the upper extremities was normal. Coordination: Rapid alternating movements in the fingers/hands was normal. Finger-to-nose maneuver  normal without evidence of ataxia, dysmetria or tremor.  Gait and station: Patient walks without assistive device and is able unassisted to climb up to the exam table. Strength within normal limits.  Stance is stable and normal.  Toe and hell stand were tested .Tandem gait is unfragmented. Deep tendon reflexes: in the  upper and lower extremities are symmetric and intact.  The patient was advised of the nature of the diagnosed sleep disorder , the treatment options and risks for general a health and wellness arising from not treating the condition.    I spent more than 15  minutes of face to face time with the patient. Greater than 50% of time was  spent in counseling and coordination of care. We have discussed the diagnosis and differential and I answered the patient's questions.     Assessment:  After physical and neurologic examination, review of laboratory studies,  Personal  review of imaging studies, reports of other /same  Imaging studies ,  Results of polysomnography/ neurophysiology testing and pre-existing records as far as provided in visit., my assessment is   Insomnia due to back pain, no apnea , no PLMs.    Plan:  Treatment plan and additional workup :  NO physiologic sleep dysfunction.   She attributes her restless sleep and insomnia to pain. She may benefit for PT and a supportive mattress .  Rv PRN, no appointment made.   Asencion Partridge Iara Monds MD  03/14/2016   CC: Kathyrn Lass, MD at  [facility]

## 2016-03-16 ENCOUNTER — Ambulatory Visit
Admission: RE | Admit: 2016-03-16 | Discharge: 2016-03-16 | Disposition: A | Discharge status: Home / Self Care | Payer: Medicare Other | Source: Ambulatory Visit | Attending: Physician Assistant | Admitting: Physician Assistant

## 2016-03-16 ENCOUNTER — Other Ambulatory Visit: Payer: Self-pay | Admitting: Physician Assistant

## 2016-03-16 DIAGNOSIS — M542 Cervicalgia: Secondary | ICD-10-CM

## 2016-03-20 ENCOUNTER — Telehealth: Payer: Self-pay | Admitting: Neurology

## 2016-03-20 NOTE — Telephone Encounter (Signed)
Pt called to advise on the report dated 02/27/16 from [place] lab her name is dictated as [name]. She wants to know if that can be corrected. It is also in the [place] dated 02/13/16 and 03/14/16 . She is requesting a corrected copy.

## 2016-03-21 ENCOUNTER — Telehealth: Payer: Self-pay

## 2016-03-21 NOTE — Telephone Encounter (Signed)
LM for patient explaining the mistake in [facility] report. It was fixed and old study was deleted. New one is scanned in. Thanked her for letting us know about mistake and gave my apologies for this happening.

## 2016-03-26 DIAGNOSIS — M542 Cervicalgia: Secondary | ICD-10-CM | POA: Diagnosis not present

## 2016-03-26 DIAGNOSIS — M6281 Muscle weakness (generalized): Secondary | ICD-10-CM | POA: Diagnosis not present

## 2016-03-26 DIAGNOSIS — M545 Low back pain: Secondary | ICD-10-CM | POA: Diagnosis not present

## 2016-03-28 DIAGNOSIS — M545 Low back pain: Secondary | ICD-10-CM | POA: Diagnosis not present

## 2016-03-28 DIAGNOSIS — M6281 Muscle weakness (generalized): Secondary | ICD-10-CM | POA: Diagnosis not present

## 2016-03-28 DIAGNOSIS — M542 Cervicalgia: Secondary | ICD-10-CM | POA: Diagnosis not present

## 2016-04-02 DIAGNOSIS — M545 Low back pain: Secondary | ICD-10-CM | POA: Diagnosis not present

## 2016-04-02 DIAGNOSIS — M6281 Muscle weakness (generalized): Secondary | ICD-10-CM | POA: Diagnosis not present

## 2016-04-02 DIAGNOSIS — M542 Cervicalgia: Secondary | ICD-10-CM | POA: Diagnosis not present

## 2016-04-09 DIAGNOSIS — M545 Low back pain: Secondary | ICD-10-CM | POA: Diagnosis not present

## 2016-04-09 DIAGNOSIS — M6281 Muscle weakness (generalized): Secondary | ICD-10-CM | POA: Diagnosis not present

## 2016-04-09 DIAGNOSIS — M542 Cervicalgia: Secondary | ICD-10-CM | POA: Diagnosis not present

## 2016-04-11 DIAGNOSIS — M6281 Muscle weakness (generalized): Secondary | ICD-10-CM | POA: Diagnosis not present

## 2016-04-11 DIAGNOSIS — M542 Cervicalgia: Secondary | ICD-10-CM | POA: Diagnosis not present

## 2016-04-11 DIAGNOSIS — M545 Low back pain: Secondary | ICD-10-CM | POA: Diagnosis not present

## 2016-04-16 DIAGNOSIS — M6281 Muscle weakness (generalized): Secondary | ICD-10-CM | POA: Diagnosis not present

## 2016-04-16 DIAGNOSIS — M542 Cervicalgia: Secondary | ICD-10-CM | POA: Diagnosis not present

## 2016-04-16 DIAGNOSIS — M545 Low back pain: Secondary | ICD-10-CM | POA: Diagnosis not present

## 2016-04-18 DIAGNOSIS — M6281 Muscle weakness (generalized): Secondary | ICD-10-CM | POA: Diagnosis not present

## 2016-04-18 DIAGNOSIS — M542 Cervicalgia: Secondary | ICD-10-CM | POA: Diagnosis not present

## 2016-04-18 DIAGNOSIS — M545 Low back pain: Secondary | ICD-10-CM | POA: Diagnosis not present

## 2016-04-24 DIAGNOSIS — M542 Cervicalgia: Secondary | ICD-10-CM | POA: Diagnosis not present

## 2016-04-24 DIAGNOSIS — M6281 Muscle weakness (generalized): Secondary | ICD-10-CM | POA: Diagnosis not present

## 2016-04-24 DIAGNOSIS — M545 Low back pain: Secondary | ICD-10-CM | POA: Diagnosis not present

## 2016-04-26 DIAGNOSIS — M545 Low back pain: Secondary | ICD-10-CM | POA: Diagnosis not present

## 2016-04-26 DIAGNOSIS — M542 Cervicalgia: Secondary | ICD-10-CM | POA: Diagnosis not present

## 2016-04-26 DIAGNOSIS — M6281 Muscle weakness (generalized): Secondary | ICD-10-CM | POA: Diagnosis not present

## 2016-04-30 DIAGNOSIS — M6281 Muscle weakness (generalized): Secondary | ICD-10-CM | POA: Diagnosis not present

## 2016-04-30 DIAGNOSIS — M545 Low back pain: Secondary | ICD-10-CM | POA: Diagnosis not present

## 2016-04-30 DIAGNOSIS — M542 Cervicalgia: Secondary | ICD-10-CM | POA: Diagnosis not present

## 2016-05-02 DIAGNOSIS — H401211 Low-tension glaucoma, right eye, mild stage: Secondary | ICD-10-CM | POA: Diagnosis not present

## 2016-05-03 DIAGNOSIS — M6281 Muscle weakness (generalized): Secondary | ICD-10-CM | POA: Diagnosis not present

## 2016-05-03 DIAGNOSIS — M542 Cervicalgia: Secondary | ICD-10-CM | POA: Diagnosis not present

## 2016-05-03 DIAGNOSIS — M545 Low back pain: Secondary | ICD-10-CM | POA: Diagnosis not present

## 2016-05-08 ENCOUNTER — Encounter: Payer: Self-pay | Admitting: Gastroenterology

## 2016-05-10 DIAGNOSIS — M6281 Muscle weakness (generalized): Secondary | ICD-10-CM | POA: Diagnosis not present

## 2016-05-10 DIAGNOSIS — M542 Cervicalgia: Secondary | ICD-10-CM | POA: Diagnosis not present

## 2016-05-10 DIAGNOSIS — M545 Low back pain: Secondary | ICD-10-CM | POA: Diagnosis not present

## 2016-05-15 DIAGNOSIS — M545 Low back pain: Secondary | ICD-10-CM | POA: Diagnosis not present

## 2016-05-15 DIAGNOSIS — M542 Cervicalgia: Secondary | ICD-10-CM | POA: Diagnosis not present

## 2016-05-15 DIAGNOSIS — M6281 Muscle weakness (generalized): Secondary | ICD-10-CM | POA: Diagnosis not present

## 2016-05-24 DIAGNOSIS — M6281 Muscle weakness (generalized): Secondary | ICD-10-CM | POA: Diagnosis not present

## 2016-05-24 DIAGNOSIS — M542 Cervicalgia: Secondary | ICD-10-CM | POA: Diagnosis not present

## 2016-05-24 DIAGNOSIS — M545 Low back pain: Secondary | ICD-10-CM | POA: Diagnosis not present

## 2016-05-31 DIAGNOSIS — M6281 Muscle weakness (generalized): Secondary | ICD-10-CM | POA: Diagnosis not present

## 2016-05-31 DIAGNOSIS — M542 Cervicalgia: Secondary | ICD-10-CM | POA: Diagnosis not present

## 2016-05-31 DIAGNOSIS — M545 Low back pain: Secondary | ICD-10-CM | POA: Diagnosis not present

## 2016-06-05 DIAGNOSIS — M545 Low back pain: Secondary | ICD-10-CM | POA: Diagnosis not present

## 2016-06-05 DIAGNOSIS — M6281 Muscle weakness (generalized): Secondary | ICD-10-CM | POA: Diagnosis not present

## 2016-06-05 DIAGNOSIS — M542 Cervicalgia: Secondary | ICD-10-CM | POA: Diagnosis not present

## 2016-06-07 DIAGNOSIS — M6281 Muscle weakness (generalized): Secondary | ICD-10-CM | POA: Diagnosis not present

## 2016-06-07 DIAGNOSIS — M542 Cervicalgia: Secondary | ICD-10-CM | POA: Diagnosis not present

## 2016-06-07 DIAGNOSIS — M545 Low back pain: Secondary | ICD-10-CM | POA: Diagnosis not present

## 2016-06-12 DIAGNOSIS — M542 Cervicalgia: Secondary | ICD-10-CM | POA: Diagnosis not present

## 2016-06-12 DIAGNOSIS — M6281 Muscle weakness (generalized): Secondary | ICD-10-CM | POA: Diagnosis not present

## 2016-06-12 DIAGNOSIS — M545 Low back pain: Secondary | ICD-10-CM | POA: Diagnosis not present

## 2016-06-19 DIAGNOSIS — M6281 Muscle weakness (generalized): Secondary | ICD-10-CM | POA: Diagnosis not present

## 2016-06-19 DIAGNOSIS — M545 Low back pain: Secondary | ICD-10-CM | POA: Diagnosis not present

## 2016-06-19 DIAGNOSIS — M542 Cervicalgia: Secondary | ICD-10-CM | POA: Diagnosis not present

## 2016-06-20 ENCOUNTER — Ambulatory Visit (AMBULATORY_SURGERY_CENTER): Payer: Self-pay | Admitting: *Deleted

## 2016-06-20 VITALS — Ht 60.0 in | Wt 128.0 lb

## 2016-06-20 DIAGNOSIS — Z1211 Encounter for screening for malignant neoplasm of colon: Secondary | ICD-10-CM

## 2016-06-20 MED ORDER — NA SULFATE-K SULFATE-MG SULF 17.5-3.13-1.6 GM/177ML PO SOLN: 1.0000 | Freq: Once | ORAL | 0 refills | Status: AC

## 2016-06-20 NOTE — Progress Notes (Signed)
Denies allergies to eggs or soy products. Denies complications with sedation or anesthesia. Denies O2 use. Denies use of diet or weight loss medications.  Emmi instructions given for colonoscopy.  

## 2016-06-28 ENCOUNTER — Encounter: Payer: Self-pay | Admitting: Gastroenterology

## 2016-06-28 ENCOUNTER — Ambulatory Visit (AMBULATORY_SURGERY_CENTER): Payer: Medicare Other | Admitting: Gastroenterology

## 2016-06-28 ENCOUNTER — Encounter: Payer: Medicare Other | Admitting: Gastroenterology

## 2016-06-28 VITALS — BP 112/85 | HR 69 | Temp 97.8°F | Resp 14 | Ht 60.0 in | Wt 128.0 lb

## 2016-06-28 DIAGNOSIS — Z1211 Encounter for screening for malignant neoplasm of colon: Secondary | ICD-10-CM | POA: Diagnosis present

## 2016-06-28 DIAGNOSIS — Z1212 Encounter for screening for malignant neoplasm of rectum: Secondary | ICD-10-CM

## 2016-06-28 MED ORDER — SODIUM CHLORIDE 0.9 % IV SOLN: 500.0000 mL | INTRAVENOUS | Status: DC

## 2016-06-28 NOTE — Progress Notes (Signed)
Report to PACU, RN, vss, BBS= Clear.  

## 2016-06-28 NOTE — Patient Instructions (Signed)
YOU HAD AN ENDOSCOPIC PROCEDURE TODAY AT [facility]:   Refer to the procedure report that was given to you for any specific questions about what was found during the examination.  If the procedure report does not answer your questions, please call your gastroenterologist to clarify.  If you requested that your care partner not be given the details of your procedure findings, then the procedure report has been included in a sealed envelope for you to review at your convenience later.  YOU SHOULD EXPECT: Some feelings of bloating in the abdomen. Passage of more gas than usual.  Walking can help get rid of the air that was put into your GI tract during the procedure and reduce the bloating. If you had a lower endoscopy (such as a colonoscopy or flexible sigmoidoscopy) you may notice spotting of blood in your stool or on the toilet paper. If you underwent a bowel prep for your procedure, you may not have a normal bowel movement for a few days.  Please Note:  You might notice some irritation and congestion in your nose or some drainage.  This is from the oxygen used during your procedure.  There is no need for concern and it should clear up in a day or so.  SYMPTOMS TO REPORT IMMEDIATELY:   Following lower endoscopy (colonoscopy or flexible sigmoidoscopy):  Excessive amounts of blood in the stool  Significant tenderness or worsening of abdominal pains  Swelling of the abdomen that is new, acute  Fever of 100F or higher    For urgent or emergent issues, a gastroenterologist can be reached at any hour by calling [phone].   DIET:  We do recommend a small meal at first, but then you may proceed to your regular diet.  Drink plenty of fluids but you should avoid alcoholic beverages for 24 hours.  ACTIVITY:  You should plan to take it easy for the rest of today and you should NOT DRIVE or use heavy machinery until tomorrow (because of the sedation medicines used during the test).     FOLLOW UP: Our staff will call the number listed on your records the next business day following your procedure to check on you and address any questions or concerns that you may have regarding the information given to you following your procedure. If we do not reach you, we will leave a message.  However, if you are feeling well and you are not experiencing any problems, there is no need to return our call.  We will assume that you have returned to your regular daily activities without incident.  If any biopsies were taken you will be contacted by phone or by letter within the next 1-3 weeks.  Please call us at [phone] if you have not heard about the biopsies in 3 weeks.    SIGNATURES/CONFIDENTIALITY: You and/or your care partner have signed paperwork which will be entered into your electronic medical record.  These signatures attest to the fact that that the information above on your After Visit Summary has been reviewed and is understood.  Full responsibility of the confidentiality of this discharge information lies with you and/or your care-partner.   Information on diverticulosis and hemorrhoids given to you today

## 2016-06-28 NOTE — Op Note (Signed)
[place] Patient Name: Anna Arnold Procedure Date: 06/28/2016 9:14 AM MRN: [id number] Endoscopist: Mauri Pole , MD Age: 66 Referring MD:  Date of Birth: 26-Jun-1950 Gender: Female Account #: [id number] Procedure:                Colonoscopy Indications:              Screening for colorectal malignant neoplasm, Last                            colonoscopy: 2006 Medicines:                Monitored Anesthesia Care Procedure:                Pre-Anesthesia Assessment:                           - Prior to the procedure, a History and Physical                            was performed, and patient medications and                            allergies were reviewed. The patient's tolerance of                            previous anesthesia was also reviewed. The risks                            and benefits of the procedure and the sedation                            options and risks were discussed with the patient.                            All questions were answered, and informed consent                            was obtained. Prior Anticoagulants: The patient has                            taken no previous anticoagulant or antiplatelet                            agents. ASA Grade Assessment: II - A patient with                            mild systemic disease. After reviewing the risks                            and benefits, the patient was deemed in                            satisfactory condition to undergo the procedure.  After obtaining informed consent, the colonoscope                            was passed under direct vision. Throughout the                            procedure, the patient's blood pressure, pulse, and                            oxygen saturations were monitored continuously. The                            Model PCF-H190L [id number]) scope was introduced                            through the anus and advanced to the  the terminal                            ileum, with identification of the appendiceal                            orifice and IC valve. The colonoscopy was performed                            without difficulty. The patient tolerated the                            procedure well. The quality of the bowel                            preparation was excellent. The terminal ileum,                            ileocecal valve, appendiceal orifice, and rectum                            were photographed. Scope In: 9:21:29 AM Scope Out: 9:34:35 AM Scope Withdrawal Time: 0 hours 9 minutes 6 seconds  Total Procedure Duration: 0 hours 13 minutes 6 seconds  Findings:                 The perianal and digital rectal examinations were                            normal.                           Multiple small and large-mouthed diverticula were                            found in the sigmoid colon and descending colon.                           Non-bleeding internal hemorrhoids were found during  retroflexion. The hemorrhoids were small.                           The exam was otherwise without abnormality. Complications:            No immediate complications. Estimated Blood Loss:     Estimated blood loss: none. Impression:               - Diverticulosis in the sigmoid colon and in the                            descending colon.                           - Non-bleeding internal hemorrhoids.                           - The examination was otherwise normal.                           - No specimens collected. Recommendation:           - Patient has a contact number available for                            emergencies. The signs and symptoms of potential                            delayed complications were discussed with the                            patient. Return to normal activities tomorrow.                            Written discharge instructions were provided to the                             patient.                           - Resume previous diet.                           - Continue present medications.                           - Repeat colonoscopy in 10 years for screening                            purposes. Mauri Pole, MD 06/28/2016 9:40:32 AM This report has been signed electronically.

## 2016-06-29 ENCOUNTER — Telehealth: Payer: Self-pay

## 2016-06-29 NOTE — Telephone Encounter (Signed)
  Follow up Call-  Call back number 06/28/2016  Post procedure Call Back phone  # [phone]  Permission to leave phone message Yes  Some recent data might be hidden     Patient questions:  Do you have a fever, pain , or abdominal swelling? No. Pain Score  0 *  Have you tolerated food without any problems? Yes.    Have you been able to return to your normal activities? Yes.    Do you have any questions about your discharge instructions: Diet   No. Medications  No. Follow up visit  No.  Do you have questions or concerns about your Care? No.  Actions: * If pain score is 4 or above: No action needed, pain <4.

## 2016-09-11 ENCOUNTER — Other Ambulatory Visit: Payer: Self-pay | Admitting: Family Medicine

## 2016-09-11 DIAGNOSIS — Z1231 Encounter for screening mammogram for malignant neoplasm of breast: Secondary | ICD-10-CM

## 2016-09-27 ENCOUNTER — Ambulatory Visit: Payer: Medicare Other

## 2016-10-16 ENCOUNTER — Ambulatory Visit
Admission: RE | Admit: 2016-10-16 | Discharge: 2016-10-16 | Disposition: A | Discharge status: Home / Self Care | Payer: Medicare Other | Source: Ambulatory Visit | Attending: Family Medicine | Admitting: Family Medicine

## 2016-10-16 DIAGNOSIS — Z1231 Encounter for screening mammogram for malignant neoplasm of breast: Secondary | ICD-10-CM

## 2016-11-26 ENCOUNTER — Telehealth: Payer: Self-pay | Admitting: Rheumatology

## 2016-11-26 NOTE — Telephone Encounter (Signed)
I do not have a referral on this patient

## 2016-11-26 NOTE — Telephone Encounter (Signed)
Patient called checking on the status of her referral?

## 2017-01-09 ENCOUNTER — Telehealth (INDEPENDENT_AMBULATORY_CARE_PROVIDER_SITE_OTHER): Payer: Self-pay | Admitting: Rheumatology

## 2017-01-09 NOTE — Telephone Encounter (Signed)
Patient called wanting records be sent to [facility] and wants medical records release form mailed to her. Form mailed to [facility] [address]  [place] [phone]

## 2017-01-30 ENCOUNTER — Telehealth (INDEPENDENT_AMBULATORY_CARE_PROVIDER_SITE_OTHER): Payer: Self-pay | Admitting: Rheumatology

## 2017-01-30 NOTE — Telephone Encounter (Signed)
Patient called received letter from [facility] stating refused release for records to sent to [facility] citing no records.  I told patient I would fax records. Upon calling [facility] for the old records of Dr. Arlean Hopping, they want an authorizion before they will fax [place] the records, even though they are Dr. Arlean Hopping records.  I will fax these records to Dr. Amil Amen as soon as I get them

## 2017-01-30 NOTE — Telephone Encounter (Signed)
DR DEVESHWAR'S RECORDS RECEIVED FROM [facility] OFFICE **FAXED TO DR. Leigh Aurora @ [place] [phone]

## 2017-06-03 DIAGNOSIS — K219 Gastro-esophageal reflux disease without esophagitis: Secondary | ICD-10-CM | POA: Diagnosis not present

## 2017-06-03 DIAGNOSIS — I10 Essential (primary) hypertension: Secondary | ICD-10-CM | POA: Diagnosis not present

## 2017-06-03 DIAGNOSIS — E7800 Pure hypercholesterolemia, unspecified: Secondary | ICD-10-CM | POA: Diagnosis not present

## 2017-06-03 DIAGNOSIS — R69 Illness, unspecified: Secondary | ICD-10-CM | POA: Diagnosis not present

## 2017-06-03 DIAGNOSIS — G43009 Migraine without aura, not intractable, without status migrainosus: Secondary | ICD-10-CM | POA: Diagnosis not present

## 2017-06-07 DIAGNOSIS — H401211 Low-tension glaucoma, right eye, mild stage: Secondary | ICD-10-CM | POA: Diagnosis not present

## 2017-06-11 DIAGNOSIS — T881XXA Other complications following immunization, not elsewhere classified, initial encounter: Secondary | ICD-10-CM | POA: Diagnosis not present

## 2017-06-11 DIAGNOSIS — R05 Cough: Secondary | ICD-10-CM | POA: Diagnosis not present

## 2017-06-11 DIAGNOSIS — M546 Pain in thoracic spine: Secondary | ICD-10-CM | POA: Diagnosis not present

## 2017-06-20 DIAGNOSIS — J0120 Acute ethmoidal sinusitis, unspecified: Secondary | ICD-10-CM | POA: Diagnosis not present

## 2017-06-20 DIAGNOSIS — J0110 Acute frontal sinusitis, unspecified: Secondary | ICD-10-CM | POA: Diagnosis not present

## 2017-06-20 DIAGNOSIS — R509 Fever, unspecified: Secondary | ICD-10-CM | POA: Diagnosis not present

## 2017-06-20 DIAGNOSIS — J029 Acute pharyngitis, unspecified: Secondary | ICD-10-CM | POA: Diagnosis not present

## 2017-06-20 DIAGNOSIS — R05 Cough: Secondary | ICD-10-CM | POA: Diagnosis not present

## 2017-07-11 DIAGNOSIS — J0101 Acute recurrent maxillary sinusitis: Secondary | ICD-10-CM | POA: Diagnosis not present

## 2017-07-11 DIAGNOSIS — R5383 Other fatigue: Secondary | ICD-10-CM | POA: Diagnosis not present

## 2017-07-11 DIAGNOSIS — M546 Pain in thoracic spine: Secondary | ICD-10-CM | POA: Diagnosis not present

## 2017-07-16 DIAGNOSIS — J310 Chronic rhinitis: Secondary | ICD-10-CM | POA: Diagnosis not present

## 2017-07-16 DIAGNOSIS — J343 Hypertrophy of nasal turbinates: Secondary | ICD-10-CM | POA: Diagnosis not present

## 2017-07-16 DIAGNOSIS — J0101 Acute recurrent maxillary sinusitis: Secondary | ICD-10-CM | POA: Diagnosis not present

## 2017-07-18 DIAGNOSIS — R509 Fever, unspecified: Secondary | ICD-10-CM | POA: Diagnosis not present

## 2017-07-18 DIAGNOSIS — J111 Influenza due to unidentified influenza virus with other respiratory manifestations: Secondary | ICD-10-CM | POA: Diagnosis not present

## 2017-07-19 ENCOUNTER — Other Ambulatory Visit (INDEPENDENT_AMBULATORY_CARE_PROVIDER_SITE_OTHER): Payer: Self-pay | Admitting: Otolaryngology

## 2017-07-19 DIAGNOSIS — J329 Chronic sinusitis, unspecified: Secondary | ICD-10-CM

## 2017-07-23 ENCOUNTER — Ambulatory Visit
Admission: RE | Admit: 2017-07-23 | Discharge: 2017-07-23 | Disposition: A | Discharge status: Home / Self Care | Payer: Medicare HMO | Source: Ambulatory Visit | Attending: Otolaryngology | Admitting: Otolaryngology

## 2017-07-23 ENCOUNTER — Other Ambulatory Visit: Payer: Medicare Other

## 2017-07-23 DIAGNOSIS — J329 Chronic sinusitis, unspecified: Secondary | ICD-10-CM

## 2017-07-30 ENCOUNTER — Other Ambulatory Visit: Payer: Medicare Other

## 2017-07-30 DIAGNOSIS — J0101 Acute recurrent maxillary sinusitis: Secondary | ICD-10-CM | POA: Diagnosis not present

## 2017-07-30 DIAGNOSIS — M546 Pain in thoracic spine: Secondary | ICD-10-CM | POA: Diagnosis not present

## 2017-08-06 DIAGNOSIS — M6281 Muscle weakness (generalized): Secondary | ICD-10-CM | POA: Diagnosis not present

## 2017-08-06 DIAGNOSIS — M545 Low back pain: Secondary | ICD-10-CM | POA: Diagnosis not present

## 2017-08-07 DIAGNOSIS — J320 Chronic maxillary sinusitis: Secondary | ICD-10-CM | POA: Diagnosis not present

## 2017-08-07 DIAGNOSIS — J322 Chronic ethmoidal sinusitis: Secondary | ICD-10-CM | POA: Diagnosis not present

## 2017-08-07 DIAGNOSIS — J343 Hypertrophy of nasal turbinates: Secondary | ICD-10-CM | POA: Diagnosis not present

## 2017-08-07 DIAGNOSIS — J342 Deviated nasal septum: Secondary | ICD-10-CM | POA: Diagnosis not present

## 2017-08-13 DIAGNOSIS — M545 Low back pain: Secondary | ICD-10-CM | POA: Diagnosis not present

## 2017-08-13 DIAGNOSIS — M6281 Muscle weakness (generalized): Secondary | ICD-10-CM | POA: Diagnosis not present

## 2017-08-19 DIAGNOSIS — M545 Low back pain: Secondary | ICD-10-CM | POA: Diagnosis not present

## 2017-08-19 DIAGNOSIS — M6281 Muscle weakness (generalized): Secondary | ICD-10-CM | POA: Diagnosis not present

## 2017-08-27 DIAGNOSIS — M6281 Muscle weakness (generalized): Secondary | ICD-10-CM | POA: Diagnosis not present

## 2017-08-27 DIAGNOSIS — M545 Low back pain: Secondary | ICD-10-CM | POA: Diagnosis not present

## 2017-09-03 DIAGNOSIS — M6281 Muscle weakness (generalized): Secondary | ICD-10-CM | POA: Diagnosis not present

## 2017-09-03 DIAGNOSIS — M545 Low back pain: Secondary | ICD-10-CM | POA: Diagnosis not present

## 2017-09-09 DIAGNOSIS — H00014 Hordeolum externum left upper eyelid: Secondary | ICD-10-CM | POA: Diagnosis not present

## 2017-09-11 DIAGNOSIS — M6281 Muscle weakness (generalized): Secondary | ICD-10-CM | POA: Diagnosis not present

## 2017-09-11 DIAGNOSIS — M545 Low back pain: Secondary | ICD-10-CM | POA: Diagnosis not present

## 2017-09-17 ENCOUNTER — Other Ambulatory Visit: Payer: Self-pay | Admitting: Family Medicine

## 2017-09-17 DIAGNOSIS — Z1231 Encounter for screening mammogram for malignant neoplasm of breast: Secondary | ICD-10-CM

## 2017-09-18 DIAGNOSIS — M545 Low back pain: Secondary | ICD-10-CM | POA: Diagnosis not present

## 2017-09-18 DIAGNOSIS — M6281 Muscle weakness (generalized): Secondary | ICD-10-CM | POA: Diagnosis not present

## 2017-09-25 DIAGNOSIS — M545 Low back pain: Secondary | ICD-10-CM | POA: Diagnosis not present

## 2017-09-25 DIAGNOSIS — M6281 Muscle weakness (generalized): Secondary | ICD-10-CM | POA: Diagnosis not present

## 2017-10-02 DIAGNOSIS — M6281 Muscle weakness (generalized): Secondary | ICD-10-CM | POA: Diagnosis not present

## 2017-10-02 DIAGNOSIS — M545 Low back pain: Secondary | ICD-10-CM | POA: Diagnosis not present

## 2017-10-07 DIAGNOSIS — K219 Gastro-esophageal reflux disease without esophagitis: Secondary | ICD-10-CM | POA: Diagnosis not present

## 2017-10-07 DIAGNOSIS — J309 Allergic rhinitis, unspecified: Secondary | ICD-10-CM | POA: Diagnosis not present

## 2017-10-07 DIAGNOSIS — R69 Illness, unspecified: Secondary | ICD-10-CM | POA: Diagnosis not present

## 2017-10-07 DIAGNOSIS — Z8249 Family history of ischemic heart disease and other diseases of the circulatory system: Secondary | ICD-10-CM | POA: Diagnosis not present

## 2017-10-07 DIAGNOSIS — I10 Essential (primary) hypertension: Secondary | ICD-10-CM | POA: Diagnosis not present

## 2017-10-07 DIAGNOSIS — G43909 Migraine, unspecified, not intractable, without status migrainosus: Secondary | ICD-10-CM | POA: Diagnosis not present

## 2017-10-07 DIAGNOSIS — H409 Unspecified glaucoma: Secondary | ICD-10-CM | POA: Diagnosis not present

## 2017-10-07 DIAGNOSIS — Z809 Family history of malignant neoplasm, unspecified: Secondary | ICD-10-CM | POA: Diagnosis not present

## 2017-10-07 DIAGNOSIS — G8929 Other chronic pain: Secondary | ICD-10-CM | POA: Diagnosis not present

## 2017-10-07 DIAGNOSIS — M1990 Unspecified osteoarthritis, unspecified site: Secondary | ICD-10-CM | POA: Diagnosis not present

## 2017-10-16 DIAGNOSIS — M6281 Muscle weakness (generalized): Secondary | ICD-10-CM | POA: Diagnosis not present

## 2017-10-16 DIAGNOSIS — M545 Low back pain: Secondary | ICD-10-CM | POA: Diagnosis not present

## 2017-10-17 ENCOUNTER — Ambulatory Visit
Admission: RE | Admit: 2017-10-17 | Discharge: 2017-10-17 | Disposition: A | Discharge status: Home / Self Care | Payer: Medicare HMO | Source: Ambulatory Visit | Attending: Family Medicine | Admitting: Family Medicine

## 2017-10-17 DIAGNOSIS — Z1231 Encounter for screening mammogram for malignant neoplasm of breast: Secondary | ICD-10-CM

## 2017-10-23 DIAGNOSIS — M6281 Muscle weakness (generalized): Secondary | ICD-10-CM | POA: Diagnosis not present

## 2017-10-23 DIAGNOSIS — M545 Low back pain: Secondary | ICD-10-CM | POA: Diagnosis not present

## 2017-10-28 DIAGNOSIS — R69 Illness, unspecified: Secondary | ICD-10-CM | POA: Diagnosis not present

## 2017-10-29 DIAGNOSIS — R69 Illness, unspecified: Secondary | ICD-10-CM | POA: Diagnosis not present

## 2017-10-30 DIAGNOSIS — M545 Low back pain: Secondary | ICD-10-CM | POA: Diagnosis not present

## 2017-10-30 DIAGNOSIS — M6281 Muscle weakness (generalized): Secondary | ICD-10-CM | POA: Diagnosis not present

## 2017-11-04 DIAGNOSIS — H26492 Other secondary cataract, left eye: Secondary | ICD-10-CM | POA: Diagnosis not present

## 2017-11-04 DIAGNOSIS — H401211 Low-tension glaucoma, right eye, mild stage: Secondary | ICD-10-CM | POA: Diagnosis not present

## 2017-11-04 DIAGNOSIS — Z961 Presence of intraocular lens: Secondary | ICD-10-CM | POA: Diagnosis not present

## 2017-11-06 DIAGNOSIS — M545 Low back pain: Secondary | ICD-10-CM | POA: Diagnosis not present

## 2017-11-06 DIAGNOSIS — M6281 Muscle weakness (generalized): Secondary | ICD-10-CM | POA: Diagnosis not present

## 2017-11-13 DIAGNOSIS — M6281 Muscle weakness (generalized): Secondary | ICD-10-CM | POA: Diagnosis not present

## 2017-11-13 DIAGNOSIS — M545 Low back pain: Secondary | ICD-10-CM | POA: Diagnosis not present

## 2017-11-25 DIAGNOSIS — M792 Neuralgia and neuritis, unspecified: Secondary | ICD-10-CM | POA: Diagnosis not present

## 2017-11-26 DIAGNOSIS — M545 Low back pain: Secondary | ICD-10-CM | POA: Diagnosis not present

## 2017-11-26 DIAGNOSIS — M6281 Muscle weakness (generalized): Secondary | ICD-10-CM | POA: Diagnosis not present

## 2017-12-03 DIAGNOSIS — J0101 Acute recurrent maxillary sinusitis: Secondary | ICD-10-CM | POA: Diagnosis not present

## 2017-12-11 DIAGNOSIS — K219 Gastro-esophageal reflux disease without esophagitis: Secondary | ICD-10-CM | POA: Diagnosis not present

## 2017-12-11 DIAGNOSIS — M545 Low back pain: Secondary | ICD-10-CM | POA: Diagnosis not present

## 2017-12-11 DIAGNOSIS — M5136 Other intervertebral disc degeneration, lumbar region: Secondary | ICD-10-CM | POA: Diagnosis not present

## 2017-12-11 DIAGNOSIS — M5030 Other cervical disc degeneration, unspecified cervical region: Secondary | ICD-10-CM | POA: Diagnosis not present

## 2017-12-11 DIAGNOSIS — M6281 Muscle weakness (generalized): Secondary | ICD-10-CM | POA: Diagnosis not present

## 2017-12-11 DIAGNOSIS — E7800 Pure hypercholesterolemia, unspecified: Secondary | ICD-10-CM | POA: Diagnosis not present

## 2017-12-11 DIAGNOSIS — G43009 Migraine without aura, not intractable, without status migrainosus: Secondary | ICD-10-CM | POA: Diagnosis not present

## 2017-12-11 DIAGNOSIS — Z1389 Encounter for screening for other disorder: Secondary | ICD-10-CM | POA: Diagnosis not present

## 2017-12-11 DIAGNOSIS — Z0000 Encounter for general adult medical examination without abnormal findings: Secondary | ICD-10-CM | POA: Diagnosis not present

## 2017-12-11 DIAGNOSIS — R69 Illness, unspecified: Secondary | ICD-10-CM | POA: Diagnosis not present

## 2017-12-11 DIAGNOSIS — Z6824 Body mass index (BMI) 24.0-24.9, adult: Secondary | ICD-10-CM | POA: Diagnosis not present

## 2017-12-11 DIAGNOSIS — I10 Essential (primary) hypertension: Secondary | ICD-10-CM | POA: Diagnosis not present

## 2017-12-18 DIAGNOSIS — M6281 Muscle weakness (generalized): Secondary | ICD-10-CM | POA: Diagnosis not present

## 2017-12-18 DIAGNOSIS — G894 Chronic pain syndrome: Secondary | ICD-10-CM | POA: Diagnosis not present

## 2017-12-18 DIAGNOSIS — M542 Cervicalgia: Secondary | ICD-10-CM | POA: Diagnosis not present

## 2017-12-20 DIAGNOSIS — G894 Chronic pain syndrome: Secondary | ICD-10-CM | POA: Diagnosis not present

## 2017-12-20 DIAGNOSIS — M6281 Muscle weakness (generalized): Secondary | ICD-10-CM | POA: Diagnosis not present

## 2017-12-20 DIAGNOSIS — M542 Cervicalgia: Secondary | ICD-10-CM | POA: Diagnosis not present

## 2017-12-23 DIAGNOSIS — G894 Chronic pain syndrome: Secondary | ICD-10-CM | POA: Diagnosis not present

## 2017-12-23 DIAGNOSIS — M6281 Muscle weakness (generalized): Secondary | ICD-10-CM | POA: Diagnosis not present

## 2017-12-23 DIAGNOSIS — M542 Cervicalgia: Secondary | ICD-10-CM | POA: Diagnosis not present

## 2017-12-25 DIAGNOSIS — J0101 Acute recurrent maxillary sinusitis: Secondary | ICD-10-CM | POA: Diagnosis not present

## 2017-12-25 DIAGNOSIS — J310 Chronic rhinitis: Secondary | ICD-10-CM | POA: Diagnosis not present

## 2017-12-25 DIAGNOSIS — H6123 Impacted cerumen, bilateral: Secondary | ICD-10-CM | POA: Diagnosis not present

## 2017-12-26 DIAGNOSIS — M542 Cervicalgia: Secondary | ICD-10-CM | POA: Diagnosis not present

## 2017-12-26 DIAGNOSIS — M6281 Muscle weakness (generalized): Secondary | ICD-10-CM | POA: Diagnosis not present

## 2017-12-26 DIAGNOSIS — G894 Chronic pain syndrome: Secondary | ICD-10-CM | POA: Diagnosis not present

## 2017-12-30 DIAGNOSIS — G894 Chronic pain syndrome: Secondary | ICD-10-CM | POA: Diagnosis not present

## 2017-12-30 DIAGNOSIS — M6281 Muscle weakness (generalized): Secondary | ICD-10-CM | POA: Diagnosis not present

## 2017-12-30 DIAGNOSIS — M542 Cervicalgia: Secondary | ICD-10-CM | POA: Diagnosis not present

## 2018-01-02 DIAGNOSIS — G894 Chronic pain syndrome: Secondary | ICD-10-CM | POA: Diagnosis not present

## 2018-01-02 DIAGNOSIS — M542 Cervicalgia: Secondary | ICD-10-CM | POA: Diagnosis not present

## 2018-01-02 DIAGNOSIS — M6281 Muscle weakness (generalized): Secondary | ICD-10-CM | POA: Diagnosis not present

## 2018-01-09 DIAGNOSIS — M542 Cervicalgia: Secondary | ICD-10-CM | POA: Diagnosis not present

## 2018-01-09 DIAGNOSIS — M6281 Muscle weakness (generalized): Secondary | ICD-10-CM | POA: Diagnosis not present

## 2018-01-09 DIAGNOSIS — G894 Chronic pain syndrome: Secondary | ICD-10-CM | POA: Diagnosis not present

## 2018-01-13 DIAGNOSIS — M6281 Muscle weakness (generalized): Secondary | ICD-10-CM | POA: Diagnosis not present

## 2018-01-13 DIAGNOSIS — G894 Chronic pain syndrome: Secondary | ICD-10-CM | POA: Diagnosis not present

## 2018-01-13 DIAGNOSIS — M542 Cervicalgia: Secondary | ICD-10-CM | POA: Diagnosis not present

## 2018-01-14 DIAGNOSIS — H26492 Other secondary cataract, left eye: Secondary | ICD-10-CM | POA: Diagnosis not present

## 2018-01-16 DIAGNOSIS — M542 Cervicalgia: Secondary | ICD-10-CM | POA: Diagnosis not present

## 2018-01-16 DIAGNOSIS — G894 Chronic pain syndrome: Secondary | ICD-10-CM | POA: Diagnosis not present

## 2018-01-16 DIAGNOSIS — M6281 Muscle weakness (generalized): Secondary | ICD-10-CM | POA: Diagnosis not present

## 2018-01-20 DIAGNOSIS — M542 Cervicalgia: Secondary | ICD-10-CM | POA: Diagnosis not present

## 2018-01-20 DIAGNOSIS — M6281 Muscle weakness (generalized): Secondary | ICD-10-CM | POA: Diagnosis not present

## 2018-01-20 DIAGNOSIS — G894 Chronic pain syndrome: Secondary | ICD-10-CM | POA: Diagnosis not present

## 2018-01-21 DIAGNOSIS — G4709 Other insomnia: Secondary | ICD-10-CM | POA: Diagnosis not present

## 2018-01-21 DIAGNOSIS — R413 Other amnesia: Secondary | ICD-10-CM | POA: Diagnosis not present

## 2018-01-21 DIAGNOSIS — R69 Illness, unspecified: Secondary | ICD-10-CM | POA: Diagnosis not present

## 2018-01-21 DIAGNOSIS — R5383 Other fatigue: Secondary | ICD-10-CM | POA: Diagnosis not present

## 2018-01-21 DIAGNOSIS — R350 Frequency of micturition: Secondary | ICD-10-CM | POA: Diagnosis not present

## 2018-01-23 DIAGNOSIS — M6281 Muscle weakness (generalized): Secondary | ICD-10-CM | POA: Diagnosis not present

## 2018-01-23 DIAGNOSIS — G894 Chronic pain syndrome: Secondary | ICD-10-CM | POA: Diagnosis not present

## 2018-01-23 DIAGNOSIS — M542 Cervicalgia: Secondary | ICD-10-CM | POA: Diagnosis not present

## 2018-01-28 DIAGNOSIS — M6281 Muscle weakness (generalized): Secondary | ICD-10-CM | POA: Diagnosis not present

## 2018-01-28 DIAGNOSIS — M542 Cervicalgia: Secondary | ICD-10-CM | POA: Diagnosis not present

## 2018-01-28 DIAGNOSIS — G894 Chronic pain syndrome: Secondary | ICD-10-CM | POA: Diagnosis not present

## 2018-01-30 DIAGNOSIS — M542 Cervicalgia: Secondary | ICD-10-CM | POA: Diagnosis not present

## 2018-01-30 DIAGNOSIS — G894 Chronic pain syndrome: Secondary | ICD-10-CM | POA: Diagnosis not present

## 2018-01-30 DIAGNOSIS — M6281 Muscle weakness (generalized): Secondary | ICD-10-CM | POA: Diagnosis not present

## 2018-02-04 DIAGNOSIS — J069 Acute upper respiratory infection, unspecified: Secondary | ICD-10-CM | POA: Diagnosis not present

## 2018-02-05 ENCOUNTER — Institutional Professional Consult (permissible substitution): Payer: Medicare Other | Admitting: Neurology

## 2018-02-06 DIAGNOSIS — M6281 Muscle weakness (generalized): Secondary | ICD-10-CM | POA: Diagnosis not present

## 2018-02-06 DIAGNOSIS — M542 Cervicalgia: Secondary | ICD-10-CM | POA: Diagnosis not present

## 2018-02-06 DIAGNOSIS — G894 Chronic pain syndrome: Secondary | ICD-10-CM | POA: Diagnosis not present

## 2018-02-07 DIAGNOSIS — G894 Chronic pain syndrome: Secondary | ICD-10-CM | POA: Diagnosis not present

## 2018-02-07 DIAGNOSIS — M542 Cervicalgia: Secondary | ICD-10-CM | POA: Diagnosis not present

## 2018-02-07 DIAGNOSIS — M6281 Muscle weakness (generalized): Secondary | ICD-10-CM | POA: Diagnosis not present

## 2018-02-10 DIAGNOSIS — M6281 Muscle weakness (generalized): Secondary | ICD-10-CM | POA: Diagnosis not present

## 2018-02-10 DIAGNOSIS — G894 Chronic pain syndrome: Secondary | ICD-10-CM | POA: Diagnosis not present

## 2018-02-10 DIAGNOSIS — M542 Cervicalgia: Secondary | ICD-10-CM | POA: Diagnosis not present

## 2018-02-11 DIAGNOSIS — H26491 Other secondary cataract, right eye: Secondary | ICD-10-CM | POA: Diagnosis not present

## 2018-02-19 DIAGNOSIS — M6281 Muscle weakness (generalized): Secondary | ICD-10-CM | POA: Diagnosis not present

## 2018-02-19 DIAGNOSIS — G894 Chronic pain syndrome: Secondary | ICD-10-CM | POA: Diagnosis not present

## 2018-02-19 DIAGNOSIS — M542 Cervicalgia: Secondary | ICD-10-CM | POA: Diagnosis not present

## 2018-02-27 DIAGNOSIS — M6281 Muscle weakness (generalized): Secondary | ICD-10-CM | POA: Diagnosis not present

## 2018-02-27 DIAGNOSIS — M542 Cervicalgia: Secondary | ICD-10-CM | POA: Diagnosis not present

## 2018-02-27 DIAGNOSIS — G894 Chronic pain syndrome: Secondary | ICD-10-CM | POA: Diagnosis not present

## 2018-03-06 DIAGNOSIS — G894 Chronic pain syndrome: Secondary | ICD-10-CM | POA: Diagnosis not present

## 2018-03-06 DIAGNOSIS — M542 Cervicalgia: Secondary | ICD-10-CM | POA: Diagnosis not present

## 2018-03-06 DIAGNOSIS — M6281 Muscle weakness (generalized): Secondary | ICD-10-CM | POA: Diagnosis not present

## 2018-03-13 DIAGNOSIS — M6281 Muscle weakness (generalized): Secondary | ICD-10-CM | POA: Diagnosis not present

## 2018-03-13 DIAGNOSIS — G894 Chronic pain syndrome: Secondary | ICD-10-CM | POA: Diagnosis not present

## 2018-03-13 DIAGNOSIS — M542 Cervicalgia: Secondary | ICD-10-CM | POA: Diagnosis not present

## 2018-03-19 DIAGNOSIS — M6281 Muscle weakness (generalized): Secondary | ICD-10-CM | POA: Diagnosis not present

## 2018-03-19 DIAGNOSIS — G894 Chronic pain syndrome: Secondary | ICD-10-CM | POA: Diagnosis not present

## 2018-03-19 DIAGNOSIS — M542 Cervicalgia: Secondary | ICD-10-CM | POA: Diagnosis not present

## 2018-03-27 DIAGNOSIS — M542 Cervicalgia: Secondary | ICD-10-CM | POA: Diagnosis not present

## 2018-03-27 DIAGNOSIS — M6281 Muscle weakness (generalized): Secondary | ICD-10-CM | POA: Diagnosis not present

## 2018-03-27 DIAGNOSIS — G894 Chronic pain syndrome: Secondary | ICD-10-CM | POA: Diagnosis not present

## 2018-03-31 DIAGNOSIS — B081 Molluscum contagiosum: Secondary | ICD-10-CM | POA: Diagnosis not present

## 2018-03-31 DIAGNOSIS — Z23 Encounter for immunization: Secondary | ICD-10-CM | POA: Diagnosis not present

## 2018-04-09 DIAGNOSIS — G894 Chronic pain syndrome: Secondary | ICD-10-CM | POA: Diagnosis not present

## 2018-04-09 DIAGNOSIS — M6281 Muscle weakness (generalized): Secondary | ICD-10-CM | POA: Diagnosis not present

## 2018-04-09 DIAGNOSIS — M542 Cervicalgia: Secondary | ICD-10-CM | POA: Diagnosis not present

## 2018-04-10 DIAGNOSIS — H401232 Low-tension glaucoma, bilateral, moderate stage: Secondary | ICD-10-CM | POA: Diagnosis not present

## 2018-05-05 DIAGNOSIS — R69 Illness, unspecified: Secondary | ICD-10-CM | POA: Diagnosis not present

## 2018-05-06 ENCOUNTER — Ambulatory Visit: Payer: Medicare Other | Admitting: Neurology

## 2018-05-06 ENCOUNTER — Encounter

## 2018-05-06 ENCOUNTER — Encounter: Payer: Self-pay | Admitting: Neurology

## 2018-05-06 VITALS — BP 139/89 | HR 89 | Ht 60.0 in | Wt 132.0 lb

## 2018-05-06 DIAGNOSIS — K219 Gastro-esophageal reflux disease without esophagitis: Secondary | ICD-10-CM | POA: Diagnosis not present

## 2018-05-06 DIAGNOSIS — R4189 Other symptoms and signs involving cognitive functions and awareness: Secondary | ICD-10-CM

## 2018-05-06 DIAGNOSIS — F341 Dysthymic disorder: Secondary | ICD-10-CM

## 2018-05-06 DIAGNOSIS — F5104 Psychophysiologic insomnia: Secondary | ICD-10-CM

## 2018-05-06 DIAGNOSIS — R69 Illness, unspecified: Secondary | ICD-10-CM | POA: Diagnosis not present

## 2018-05-06 DIAGNOSIS — M542 Cervicalgia: Secondary | ICD-10-CM | POA: Diagnosis not present

## 2018-05-06 NOTE — Progress Notes (Signed)
SLEEP MEDICINE CLINIC   Provider:  Larey Seat, MD   Referring Provider: Kathyrn Lass, MD Primary Care Physician:  Anna Lass, MD  Chief Complaint  Patient presents with  . New Patient (Initial Visit)    pt alone, rm 10. pt states that she doesnt get  enough sleep a night. she states that she gets 5-6 hours of sleep. she is irritable. she feels like around this time daily she gets tired but she is unable to do so. she admites to mental confusion and brain fog. complains of freqent headaches which she gets with weather changes. RN Anna Arnold    HPI:  Anna Arnold is a 67 y.o. female , seen with sleep and memory complaint today 05-06-2018,  The patient describes that she is more irritable, she is usually getting tired after lunch very sleepy at times she feels that she has some brain fog.  She gets about 5 to 6 hours of sleep which has been a problem for many years.  The patient was today seen here as a request from Dr. Leighton Ruff, MD who describes the problem patient's problems as #1 fatigue she has not found a metabolic etiology and referred to neurology for evaluation of both memory problems and sleep problems, urinary frequency, depression recurrent the patient is on bupropion 12-hour release 100 mg 1 tablet.  " Other insomnia"  is her issue #4 and as she is yawning  I am addressing once again organic versus non organic insomnia.  Hot flushes at night, diaphoresis. She can't take HRT because of DVT in 1994.   The patient has been previously evaluated more than once for organic reasons of insomnia and excessive daytime sleepiness. I had relate to her PCP that neither PLMs nor apnea were present, and that again was causing Insomnia. The patient reports she has been in physical therapy for cervicalgia. She had success but stopped doing the exercises at home , and promptly had the symptoms return. My Nurse preformed a Stanton with the patient today. 85/30 MOCA today.   Fam history : She  mentions her father a had Alzheimer's dementia.      Last visit 03-14-2016 seen here as a referral  from Anna Arnold , MD - I have seen this patient for many years until approximately 4 years ago. Her primary care physician reported problems with insomnia, possible persistent hypersomnia in the setting of obstructive sleep apnea, irritability but also forgetfulness and concerned about cognitive function.  Chief complaint according to patient :  Anna Arnold underwent a sleep study at home in 2013 but previously and had seen her in 2007 and evaluated her on 12/14/2005 at the [facility]. She was diagnosed with mild apnea (AHI was 8.8/h) but exacerbated during REM to 34/h. Oxygen nadir was 80% and she was snoring.  The patient reports no problems going to sleep, but she feels exhausted throughout the day. Her Epworth sleepiness score was endorsed at 10 points and her fatigue severity is highly elevated at 50 points. She does not endorse a significant level of depression on the geriatric depression score 2 out of 15. She has used a fit bit and feels that it indicated that she gets only 3-5 hours of nocturnal sleep. She seems to be very restless throughout the night.  Sleep habits are as follows: She as dain after % AM. gBedroom is cool, quiet and dark. She sleeps on her side, the left, on 1 pillow. Wakes up at  2 times , at 3 and 5 AM to the bathroom, and has trouble to sleep again.  She shares a bedroom with her husband. Her husband occasionally nudges her when she is snoring, and has tried to persuade her not to look at her fit bit has often. On Saturdays and Sundays she seems to sleep longer and better than on week days. Her husband set the alarm to 5:30 and usually she will wake up from that as well. She always feels that she needs another hour or 2 of sleep in the morning. Her fit bit  States she only sleeps 5-6  hours ...  She does not feel rested or restored fully  refreshed. The week and seem better and the alarm is usually not ringing on those days.  She will frequently wake up with headaches.  Nasal congestion sinus congestions and a dry mouth are present in the morning.  ROS : The patient endorsed insomnia as well as daytime sleepiness restless sleep, snoring, memory loss, confusion morning headaches occasional lightheadedness, aching muscles or joint pain, increased thirst, feeling hot or cold having blurred vision respiratory allergies and high fatigue.   Her family history is positive for her father deceased of Alzheimer's disease, brother deceased of a heart attack and her daughter has frequent migraines and suffers from glaucoma.   Social history: retired Engineer, drilling, now Print production planner.  mother , grandmother of 21.  Non smoker, non ETOH, caffeine- cup  Of coffee in AM, pepsi  Or iced tea before 2 PM, once in a while.   Interval history from 03/14/2016, I have the pleasure of seeing Anna Arnold today, who underwent a diagnostic polysomnogram on 02/27/2016. This resulted in no significant apnea with an AHI of only 0.3, REM AHI was 2.7, she did not have oxygen desaturations she had very few periodic limb movements, none of them arousal causing. The meeting today to discuss the results and also the reasons for insomnia, which the patient attributes to back pain and discomfort. I would encourage her to see  physical therapy and maybe multi modalities in physical therapy to find relief.  Review of Systems: Out of a complete 14 system review, the patient complains of only the following symptoms, and all other reviewed systems are negative.  MOCA 28/30   Neck pain - improved under PT -   Epworth score 4 down form 10 / 24  , Fatigue severity score  53/ 63   , depression score 4/ 15 points    Social History   Socioeconomic History  . Marital status: Married    Spouse name: Not on file  . Number of children: Not on file  . Years of  education: Not on file  . Highest education level: Not on file  Occupational History  . Not on file  Social Needs  . Financial resource strain: Not on file  . Food insecurity:    Worry: Not on file    Inability: Not on file  . Transportation needs:    Medical: Not on file    Non-medical: Not on file  Tobacco Use  . Smoking status: Never Smoker  . Smokeless tobacco: Never Used  Substance and Sexual Activity  . Alcohol use: No  . Drug use: No  . Sexual activity: Not on file  Lifestyle  . Physical activity:    Days per week: Not on file    Minutes per session: Not on file  . Stress: Not on file  Relationships  .  Social connections:    Talks on phone: Not on file    Gets together: Not on file    Attends religious service: Not on file    Active member of club or organization: Not on file    Attends meetings of clubs or organizations: Not on file    Relationship status: Not on file  . Intimate partner violence:    Fear of current or ex partner: Not on file    Emotionally abused: Not on file    Physically abused: Not on file    Forced sexual activity: Not on file  Other Topics Concern  . Not on file  Social History Narrative  . Not on file    Family History  Problem Relation Age of Onset  . Cancer Mother   . Depression Mother   . Heart disease Father   . Heart attack Brother   . Depression Brother   . Colon cancer Neg Hx   . Colon polyps Neg Hx   . Esophageal cancer Neg Hx   . Rectal cancer Neg Hx   . Stomach cancer Neg Hx     Past Medical History:  Diagnosis Date  . Anxiety   . Arthritis   . Cataracts, bilateral   . Cervical spondylosis   . Degenerative disc disease, lumbar   . Depression   . Fibromyalgia   . GERD (gastroesophageal reflux disease)   . Glaucoma   . Hypertension   . Migraines   . Osteopenia   . Seasonal allergies   . Sleep disorder     Past Surgical History:  Procedure Laterality Date  . bunionectomy Right 2008  . CATARACT  EXTRACTION Bilateral 2015-2016  . [place]  . GLAUCOMA SURGERY Bilateral 2013  . HAMMER TOE SURGERY Right 2008  . ROTATOR CUFF REPAIR Right 2012    Current Outpatient Medications  Medication Sig Dispense Refill  . Acetaminophen (TYLENOL ARTHRITIS PAIN PO) Take 650 mg by mouth 2 (two) times daily as needed (for arthritis pain).    . bimatoprost (LUMIGAN) 0.01 % SOLN Place 1 drop into both eyes at bedtime.    [name] buPROPion (WELLBUTRIN SR) 100 MG 12 hr tablet Take 100 mg by mouth daily.    . Cholecalciferol (VITAMIN D3) 1000 units CAPS Take by mouth.    . fluticasone (FLONASE) 50 MCG/ACT nasal spray Place 1 spray into both nostrils 2 (two) times daily as needed for allergies or rhinitis.    [name] losartan (COZAAR) 50 MG tablet Take 50 mg by mouth daily.    . Multiple Minerals-Vitamins (CALCIUM-MAGNESIUM-ZINC-D3 PO) Take by mouth.    . MULTIPLE VITAMIN PO Take by mouth.    . Omega-3 Fatty Acids (FISH OIL ULTRA) 1400 MG CAPS Take by mouth.    . pantoprazole (PROTONIX) 40 MG tablet Take 40 mg by mouth daily.    Vladimir Faster Glycol-Propyl Glycol (SYSTANE PRESERVATIVE FREE OP) Apply to eye as needed.    . SUMAtriptan (IMITREX) 100 MG tablet Take 100 mg by mouth every 2 (two) hours as needed for migraine. Take 1/2 tablet for migraine. May repeat in 2 hours if headache persists or recurs.     Current Facility-Administered Medications  Medication Dose Route Frequency Provider Last Rate Last Dose  . 0.9 %  sodium chloride infusion  500 mL Intravenous Continuous Mauri Pole, MD        Allergies as of 05/06/2018 - Review Complete 05/06/2018  Allergen Reaction Noted  . Amitriptyline  02/13/2016  .  Atenolol  02/13/2016  . Augmentin [amoxicillin-pot clavulanate]  02/13/2016  . Biaxin [clarithromycin]  02/13/2016  . Ceftin [cefuroxime axetil]  02/13/2016  . Chlorthalidone  02/13/2016  . Citalopram hydrobromide Other (See Comments) 02/13/2016  . Erythromycin  04/30/2008  . Fluoxetine  Other (See Comments) 02/13/2016  . Lexapro [escitalopram oxalate]  02/13/2016  . Meloxicam  02/13/2016  . Potassium chloride  02/13/2016  . Pravastatin  02/13/2016  . Reglan [metoclopramide]  02/13/2016  . Sertraline hcl Other (See Comments) 02/13/2016  . Simvastatin  02/13/2016  . Singulair [montelukast sodium]  02/13/2016  . Viibryd [vilazodone hcl]  02/13/2016  . Zegerid [omeprazole-sodium bicarbonate]  02/13/2016  . Zegerid [omeprazole]  02/13/2016    Vitals: BP 139/89   Pulse 89   Ht 5' (1.524 m)   Wt 132 lb (59.9 kg)   BMI 25.78 kg/m  Last Weight:  Wt Readings from Last 1 Encounters:  05/06/18 132 lb (59.9 kg)   VHQ:IONG mass index is 25.78 kg/m.     Last Height:   Ht Readings from Last 1 Encounters:  05/06/18 5' (1.524 m)    Physical exam:  General: The patient is awake, alert and appears not in acute distress. The patient is well groomed. Head: Normocephalic, atraumatic. Neck is supple. Mallampati 2,  neck circumference 14 . Nasal airflow congested , TMJ is not  evident . Retrognathia is not seen.  Cardiovascular:  Regular rate and rhythm, without  murmurs or carotid bruit, and without distended neck veins. Respiratory: Lungs are clear to auscultation. Skin:  Without evidence of edema, or rash Trunk: BMI is 24 The patient's posture is erect   Neurologic exam : The patient is awake and alert, oriented to place and time.   Memory subjective described as impaired   MOCA: 05-06-2018 - 28/30   Montreal Cognitive Assessment  02/13/2016  Visuospatial/ Executive (0/5) 3  Naming (0/3) 3  Attention: Read list of digits (0/2) 2  Attention: Read list of letters (0/1) 1  Attention: Serial 7 subtraction starting at 100 (0/3) 3  Language: Repeat phrase (0/2) 1  Language : Fluency (0/1) 1  Abstraction (0/2) 2  Delayed Recall (0/5) 3  Orientation (0/6) 6  Total 25  Adjusted Score (based on education) 25   Attention span & concentration ability appears normal.    Speech is fluent,  without  dysarthria, dysphonia or aphasia.  Mood and affect are appropriate, she appears worried.   Cranial nerves: Pupils are equal and briskly reactive to light. Funduscopic exam without  evidence of pallor or edema. Extraocular movements  in vertical and horizontal planes intact and without nystagmus.  Visual fields by finger perimetry are intact.Hearing to finger rub intact. Facial sensation intact to fine touch.Facial motor strength is symmetric and tongue and uvula move midline. Shoulder shrug was symmetrical.  Motor exam:   Normal tone, muscle bulk and symmetric strength in all extremities. Sensory:  Fine touch, pinprick and vibration were tested in all extremities. Proprioception tested in the upper extremities was normal. Coordination: Rapid alternating movements in the fingers/hands was normal. Finger-to-nose maneuver  normal without evidence of ataxia, dysmetria or tremor.  Gait and station: Patient walks without assistive device and is able unassisted to climb up to the exam table. Strength within normal limits.  Stance is stable and normal.  Toe and heel stand and gait were tested - and she did very well . Tandem gait is unfragmented.  Turns with 3 steps, fluently. No loss of arm swing.  Deep tendon reflexes: in the upper and lower extremities are symmetric and intact.    The patient was advised of  the treatment options for chronic insomnia, perceived sleep disorder, menopausal sleep disorder, depression and neck pain and risks for general a health and wellness arising from not treating the condition.    I spent more than 50 minutes of face to face time with the patient. Greater than 50% of time was spent in counseling and coordination of care. We have discussed the diagnosis and differential and I answered the patient's questions.     Assessment:  After physical and neurologic examination, review of laboratory studies,  Personal review of imaging studies,  reports of other /same  Imaging studies ,  Results of polysomnography/ neurophysiology testing and pre-existing records as far as provided in visit., my assessment is   1) Insomnia is much improved since her newer fit bit tells her she sleeps 5-6 hours , ( when the old one told her 3-4 hours only) I think she has made herself dependent of the sleep time count by a very faulty device.  2) Not EDS-  but she remains fatigued. Depression, she stated that while 150 mg wellbutrin was too much, the 100 mg dose is well tolerated and gives some energy.  3 Memory is not impaired according to [facility] testing.    Plan:  Treatment plan and additional workup :  NO physiologic sleep dysfunction in the past- I offered an attended sleep study to proof her sleep time is better than 5 hours. I am not sure her insurance is willing to pay for this.   She attributes her restless sleep and perceived insomnia to joint pain. She may benefit for more PT and more exercises at home or I the gym, may be with a trainer.   Rv PRN,   no appointment was made.   Asencion Partridge Tymel Conely MD  05/06/2018   CC: Anna Lass, MD at  [facility]

## 2018-05-14 ENCOUNTER — Telehealth: Payer: Self-pay

## 2018-05-14 ENCOUNTER — Other Ambulatory Visit: Payer: Self-pay | Admitting: Neurology

## 2018-05-14 DIAGNOSIS — R4189 Other symptoms and signs involving cognitive functions and awareness: Secondary | ICD-10-CM

## 2018-05-14 DIAGNOSIS — F341 Dysthymic disorder: Secondary | ICD-10-CM

## 2018-05-14 DIAGNOSIS — F5104 Psychophysiologic insomnia: Secondary | ICD-10-CM

## 2018-05-14 DIAGNOSIS — K219 Gastro-esophageal reflux disease without esophagitis: Secondary | ICD-10-CM

## 2018-05-14 NOTE — Telephone Encounter (Signed)
HST ordered for the patient 

## 2018-05-14 NOTE — Telephone Encounter (Signed)
Insurance has denied in lab sleep study request. Do you want to order HST? Pt is willing to have HST.

## 2018-05-26 DIAGNOSIS — Z0100 Encounter for examination of eyes and vision without abnormal findings: Secondary | ICD-10-CM | POA: Diagnosis not present

## 2018-06-09 ENCOUNTER — Ambulatory Visit (INDEPENDENT_AMBULATORY_CARE_PROVIDER_SITE_OTHER): Payer: Medicare HMO | Admitting: Neurology

## 2018-06-09 DIAGNOSIS — F5104 Psychophysiologic insomnia: Secondary | ICD-10-CM

## 2018-06-09 DIAGNOSIS — F341 Dysthymic disorder: Secondary | ICD-10-CM

## 2018-06-09 DIAGNOSIS — G4733 Obstructive sleep apnea (adult) (pediatric): Secondary | ICD-10-CM

## 2018-06-09 DIAGNOSIS — K219 Gastro-esophageal reflux disease without esophagitis: Secondary | ICD-10-CM

## 2018-06-09 DIAGNOSIS — R0683 Snoring: Secondary | ICD-10-CM

## 2018-06-09 DIAGNOSIS — R4189 Other symptoms and signs involving cognitive functions and awareness: Secondary | ICD-10-CM

## 2018-06-16 NOTE — Procedures (Signed)
NAME:   [name]. Goth                                                                           DOB: Mar 30, 1951 MEDICAL RECORD No: [id number]                                                            DOS:  06/11/2018 REFERRING PHYSICIAN: Kathyrn Lass, MD, Leighton Ruff, MD STUDY PERFORMED: Home Sleep Test on Watch Pat HISTORY: Anna Arnold is a re- referred 68 y.o. female patient, now seen with sleep and memory complaint today 05-06-2018. The patient describes that she is more irritable, she is usually getting tired after lunch very sleepy at times she feels that she has some brain fog.  She gets about 5 to 6 hours of sleep which has been a problem for many years.  The patient was today seen here as a request from Dr. Leighton Ruff, MD who describes the problem patient's problems as primarily fatigue -she has not found a metabolic etiology and referred to neurology for evaluation of both memory problems and sleep problems, urinary frequency, depression recurrent the patient is on bupropion 12-hour release 100 mg 1 tablet.Reports having hot flushes at night, diaphoresis. She can't take HRT because of DVT in 1994. Is yawning frequently/   The patient has been previously evaluated more than once for organic reasons of insomnia and excessive daytime sleepiness. I had relate to her PCP that neither PLMs nor apnea were present before.[name] 28/30 in [facility] today. Epworth Sleepiness score endorsed at 4 points, down from 10 / 24 last test time, BMI: 26.0  STUDY RESULTS:  Total Recording Time: 7 h 80mins; Total Sleep Time:  6 h 15 min. Total Apnea/Hypopnea Index (AHI): 17.4/h; RDI:  20.6/h; REM AHI: 22.4 /h. Supine AHI was 27.1/h. Average Oxygen Saturation:  95 %; Lowest Oxygen Saturation: 79 %.  Total Time in Oxygen Saturation below 89 %: 3.7 minutes.  Average Heart Rate:  sinus rhythm 71 bpm (between 54 and 103 bpm).  IMPRESSION: Mild to moderate Sleep apnea is now present, with moderate snoring, acc. to  RDI.  Some REM accentuation of AHI, but not REM dependent apnea.  Supine sleep position contributed markedly to apnea increase.   RECOMMENDATION: avoid supine sleep.  Can use Dental device, CPAP or Inspire procedure for apnea control. Non supine AHI was 7/h and positional therapy could be sufficient treatment.  I certify that I have reviewed the raw data recording prior to the issuance of this report in accordance with the standards of the American Academy of Sleep Medicine (AASM). Larey Seat, M.D.    06-16-2018      Medical Director of [facility] at [facility], accredited by the AASM. Diplomat of the ABPN and ABSM.

## 2018-06-17 ENCOUNTER — Telehealth: Payer: Self-pay | Admitting: Neurology

## 2018-06-17 DIAGNOSIS — M542 Cervicalgia: Secondary | ICD-10-CM | POA: Diagnosis not present

## 2018-06-17 DIAGNOSIS — M2550 Pain in unspecified joint: Secondary | ICD-10-CM | POA: Diagnosis not present

## 2018-06-17 DIAGNOSIS — K219 Gastro-esophageal reflux disease without esophagitis: Secondary | ICD-10-CM | POA: Diagnosis not present

## 2018-06-17 DIAGNOSIS — G4733 Obstructive sleep apnea (adult) (pediatric): Secondary | ICD-10-CM | POA: Diagnosis not present

## 2018-06-17 DIAGNOSIS — I10 Essential (primary) hypertension: Secondary | ICD-10-CM | POA: Diagnosis not present

## 2018-06-17 DIAGNOSIS — E7800 Pure hypercholesterolemia, unspecified: Secondary | ICD-10-CM | POA: Diagnosis not present

## 2018-06-17 DIAGNOSIS — G43009 Migraine without aura, not intractable, without status migrainosus: Secondary | ICD-10-CM | POA: Diagnosis not present

## 2018-06-17 DIAGNOSIS — Z6825 Body mass index (BMI) 25.0-25.9, adult: Secondary | ICD-10-CM | POA: Diagnosis not present

## 2018-06-17 DIAGNOSIS — R69 Illness, unspecified: Secondary | ICD-10-CM | POA: Diagnosis not present

## 2018-06-17 NOTE — Telephone Encounter (Signed)
-----   Message from Larey Seat, MD sent at 06/16/2018  5:20 PM EST ----- IMPRESSION: Mild to moderate Sleep apnea is now present, with  moderate snoring, acc. to RDI.  Some REM accentuation of AHI, but not REM dependent apnea.  Supine sleep position contributed markedly to apnea increase.   RECOMMENDATION: avoid supine sleep. Can use Dental device, CPAP  or Inspire procedure for apnea control. Non supine AHI was 7/h  and positional therapy could be sufficient treatment.  I certify that I have reviewed the raw data recording prior to  the issuance of this report in accordance with the standards of  the American Academy of Sleep Medicine (AASM). Larey Seat, M.D.  06-16-2018    Cc Dr Kathyrn Lass, MD

## 2018-06-17 NOTE — Telephone Encounter (Signed)
I called pt. I advised pt that Dr. Brett Fairy reviewed their sleep study results and found that pt has mild to moderate sleep apnea with snoring associated. Dr. Brett Fairy recommends that pt treat this with 1st positional change. She would like her to avoid sleeping on back since that was when the apnea was most prominent.Dr Dohmeier also mentioned that we could offer a dental referral for a dental device. I informed the patient what that would mean. Reviewed also the CPAP and what that means and also what the inspire is. The pt is on the way to her MD apt and she asked that I fax this to them. I have routed a copy to her PCP to review. The patient lost connection on her cell phone. If she calls back please have the patient inform us what treatment option she would like to move forward with.

## 2018-06-25 DIAGNOSIS — H401212 Low-tension glaucoma, right eye, moderate stage: Secondary | ICD-10-CM | POA: Diagnosis not present

## 2018-07-07 DIAGNOSIS — R05 Cough: Secondary | ICD-10-CM | POA: Diagnosis not present

## 2018-07-07 DIAGNOSIS — J040 Acute laryngitis: Secondary | ICD-10-CM | POA: Diagnosis not present

## 2018-08-05 DIAGNOSIS — G894 Chronic pain syndrome: Secondary | ICD-10-CM | POA: Diagnosis not present

## 2018-08-05 DIAGNOSIS — M545 Low back pain: Secondary | ICD-10-CM | POA: Diagnosis not present

## 2018-08-05 DIAGNOSIS — M6281 Muscle weakness (generalized): Secondary | ICD-10-CM | POA: Diagnosis not present

## 2018-08-12 DIAGNOSIS — G894 Chronic pain syndrome: Secondary | ICD-10-CM | POA: Diagnosis not present

## 2018-08-12 DIAGNOSIS — M545 Low back pain: Secondary | ICD-10-CM | POA: Diagnosis not present

## 2018-08-12 DIAGNOSIS — M6281 Muscle weakness (generalized): Secondary | ICD-10-CM | POA: Diagnosis not present

## 2018-08-20 DIAGNOSIS — M6281 Muscle weakness (generalized): Secondary | ICD-10-CM | POA: Diagnosis not present

## 2018-08-20 DIAGNOSIS — G894 Chronic pain syndrome: Secondary | ICD-10-CM | POA: Diagnosis not present

## 2018-08-20 DIAGNOSIS — M545 Low back pain: Secondary | ICD-10-CM | POA: Diagnosis not present

## 2018-08-26 DIAGNOSIS — M6281 Muscle weakness (generalized): Secondary | ICD-10-CM | POA: Diagnosis not present

## 2018-08-26 DIAGNOSIS — G894 Chronic pain syndrome: Secondary | ICD-10-CM | POA: Diagnosis not present

## 2018-08-26 DIAGNOSIS — M545 Low back pain: Secondary | ICD-10-CM | POA: Diagnosis not present

## 2018-09-02 DIAGNOSIS — M545 Low back pain: Secondary | ICD-10-CM | POA: Diagnosis not present

## 2018-09-02 DIAGNOSIS — M6281 Muscle weakness (generalized): Secondary | ICD-10-CM | POA: Diagnosis not present

## 2018-09-02 DIAGNOSIS — G894 Chronic pain syndrome: Secondary | ICD-10-CM | POA: Diagnosis not present

## 2018-09-09 DIAGNOSIS — G894 Chronic pain syndrome: Secondary | ICD-10-CM | POA: Diagnosis not present

## 2018-09-09 DIAGNOSIS — M545 Low back pain: Secondary | ICD-10-CM | POA: Diagnosis not present

## 2018-09-09 DIAGNOSIS — M6281 Muscle weakness (generalized): Secondary | ICD-10-CM | POA: Diagnosis not present

## 2018-09-11 DIAGNOSIS — M6281 Muscle weakness (generalized): Secondary | ICD-10-CM | POA: Diagnosis not present

## 2018-09-11 DIAGNOSIS — G894 Chronic pain syndrome: Secondary | ICD-10-CM | POA: Diagnosis not present

## 2018-09-11 DIAGNOSIS — M545 Low back pain: Secondary | ICD-10-CM | POA: Diagnosis not present

## 2018-09-16 DIAGNOSIS — M545 Low back pain: Secondary | ICD-10-CM | POA: Diagnosis not present

## 2018-09-16 DIAGNOSIS — G894 Chronic pain syndrome: Secondary | ICD-10-CM | POA: Diagnosis not present

## 2018-09-16 DIAGNOSIS — M6281 Muscle weakness (generalized): Secondary | ICD-10-CM | POA: Diagnosis not present

## 2018-09-18 DIAGNOSIS — G894 Chronic pain syndrome: Secondary | ICD-10-CM | POA: Diagnosis not present

## 2018-09-18 DIAGNOSIS — M545 Low back pain: Secondary | ICD-10-CM | POA: Diagnosis not present

## 2018-09-18 DIAGNOSIS — M6281 Muscle weakness (generalized): Secondary | ICD-10-CM | POA: Diagnosis not present

## 2018-09-23 DIAGNOSIS — M6281 Muscle weakness (generalized): Secondary | ICD-10-CM | POA: Diagnosis not present

## 2018-09-23 DIAGNOSIS — G894 Chronic pain syndrome: Secondary | ICD-10-CM | POA: Diagnosis not present

## 2018-09-23 DIAGNOSIS — M545 Low back pain: Secondary | ICD-10-CM | POA: Diagnosis not present

## 2018-09-25 DIAGNOSIS — M545 Low back pain: Secondary | ICD-10-CM | POA: Diagnosis not present

## 2018-09-25 DIAGNOSIS — M6281 Muscle weakness (generalized): Secondary | ICD-10-CM | POA: Diagnosis not present

## 2018-09-25 DIAGNOSIS — G894 Chronic pain syndrome: Secondary | ICD-10-CM | POA: Diagnosis not present

## 2018-09-26 ENCOUNTER — Other Ambulatory Visit: Payer: Self-pay | Admitting: Family Medicine

## 2018-09-26 DIAGNOSIS — Z1231 Encounter for screening mammogram for malignant neoplasm of breast: Secondary | ICD-10-CM

## 2018-09-30 DIAGNOSIS — M545 Low back pain: Secondary | ICD-10-CM | POA: Diagnosis not present

## 2018-09-30 DIAGNOSIS — G894 Chronic pain syndrome: Secondary | ICD-10-CM | POA: Diagnosis not present

## 2018-09-30 DIAGNOSIS — M6281 Muscle weakness (generalized): Secondary | ICD-10-CM | POA: Diagnosis not present

## 2018-10-02 DIAGNOSIS — M545 Low back pain: Secondary | ICD-10-CM | POA: Diagnosis not present

## 2018-10-02 DIAGNOSIS — G894 Chronic pain syndrome: Secondary | ICD-10-CM | POA: Diagnosis not present

## 2018-10-02 DIAGNOSIS — M6281 Muscle weakness (generalized): Secondary | ICD-10-CM | POA: Diagnosis not present

## 2018-10-07 DIAGNOSIS — M6281 Muscle weakness (generalized): Secondary | ICD-10-CM | POA: Diagnosis not present

## 2018-10-07 DIAGNOSIS — G894 Chronic pain syndrome: Secondary | ICD-10-CM | POA: Diagnosis not present

## 2018-10-07 DIAGNOSIS — M545 Low back pain: Secondary | ICD-10-CM | POA: Diagnosis not present

## 2018-10-09 DIAGNOSIS — M6281 Muscle weakness (generalized): Secondary | ICD-10-CM | POA: Diagnosis not present

## 2018-10-09 DIAGNOSIS — G894 Chronic pain syndrome: Secondary | ICD-10-CM | POA: Diagnosis not present

## 2018-10-09 DIAGNOSIS — M545 Low back pain: Secondary | ICD-10-CM | POA: Diagnosis not present

## 2018-10-14 DIAGNOSIS — I10 Essential (primary) hypertension: Secondary | ICD-10-CM | POA: Diagnosis not present

## 2018-10-14 DIAGNOSIS — R5383 Other fatigue: Secondary | ICD-10-CM | POA: Diagnosis not present

## 2018-10-14 DIAGNOSIS — Z6825 Body mass index (BMI) 25.0-25.9, adult: Secondary | ICD-10-CM | POA: Diagnosis not present

## 2018-10-14 DIAGNOSIS — R42 Dizziness and giddiness: Secondary | ICD-10-CM | POA: Diagnosis not present

## 2018-10-14 DIAGNOSIS — R002 Palpitations: Secondary | ICD-10-CM | POA: Diagnosis not present

## 2018-10-14 DIAGNOSIS — Z789 Other specified health status: Secondary | ICD-10-CM | POA: Diagnosis not present

## 2018-10-16 DIAGNOSIS — M545 Low back pain: Secondary | ICD-10-CM | POA: Diagnosis not present

## 2018-10-16 DIAGNOSIS — M6281 Muscle weakness (generalized): Secondary | ICD-10-CM | POA: Diagnosis not present

## 2018-10-16 DIAGNOSIS — G894 Chronic pain syndrome: Secondary | ICD-10-CM | POA: Diagnosis not present

## 2018-10-21 DIAGNOSIS — G894 Chronic pain syndrome: Secondary | ICD-10-CM | POA: Diagnosis not present

## 2018-10-21 DIAGNOSIS — M6281 Muscle weakness (generalized): Secondary | ICD-10-CM | POA: Diagnosis not present

## 2018-10-21 DIAGNOSIS — M545 Low back pain: Secondary | ICD-10-CM | POA: Diagnosis not present

## 2018-10-23 DIAGNOSIS — G894 Chronic pain syndrome: Secondary | ICD-10-CM | POA: Diagnosis not present

## 2018-10-23 DIAGNOSIS — M6281 Muscle weakness (generalized): Secondary | ICD-10-CM | POA: Diagnosis not present

## 2018-10-23 DIAGNOSIS — M545 Low back pain: Secondary | ICD-10-CM | POA: Diagnosis not present

## 2018-10-28 DIAGNOSIS — M545 Low back pain: Secondary | ICD-10-CM | POA: Diagnosis not present

## 2018-10-28 DIAGNOSIS — M6281 Muscle weakness (generalized): Secondary | ICD-10-CM | POA: Diagnosis not present

## 2018-10-28 DIAGNOSIS — G894 Chronic pain syndrome: Secondary | ICD-10-CM | POA: Diagnosis not present

## 2018-11-05 DIAGNOSIS — G894 Chronic pain syndrome: Secondary | ICD-10-CM | POA: Diagnosis not present

## 2018-11-05 DIAGNOSIS — M545 Low back pain: Secondary | ICD-10-CM | POA: Diagnosis not present

## 2018-11-05 DIAGNOSIS — M6281 Muscle weakness (generalized): Secondary | ICD-10-CM | POA: Diagnosis not present

## 2018-11-10 DIAGNOSIS — R69 Illness, unspecified: Secondary | ICD-10-CM | POA: Diagnosis not present

## 2018-11-12 DIAGNOSIS — M545 Low back pain: Secondary | ICD-10-CM | POA: Diagnosis not present

## 2018-11-12 DIAGNOSIS — M6281 Muscle weakness (generalized): Secondary | ICD-10-CM | POA: Diagnosis not present

## 2018-11-12 DIAGNOSIS — G894 Chronic pain syndrome: Secondary | ICD-10-CM | POA: Diagnosis not present

## 2018-11-13 DIAGNOSIS — H401212 Low-tension glaucoma, right eye, moderate stage: Secondary | ICD-10-CM | POA: Diagnosis not present

## 2018-11-14 DIAGNOSIS — R002 Palpitations: Secondary | ICD-10-CM | POA: Diagnosis not present

## 2018-11-14 DIAGNOSIS — R5383 Other fatigue: Secondary | ICD-10-CM | POA: Diagnosis not present

## 2018-11-14 DIAGNOSIS — I10 Essential (primary) hypertension: Secondary | ICD-10-CM | POA: Diagnosis not present

## 2018-11-14 DIAGNOSIS — Z6825 Body mass index (BMI) 25.0-25.9, adult: Secondary | ICD-10-CM | POA: Diagnosis not present

## 2018-11-14 DIAGNOSIS — R42 Dizziness and giddiness: Secondary | ICD-10-CM | POA: Diagnosis not present

## 2018-11-19 DIAGNOSIS — M6281 Muscle weakness (generalized): Secondary | ICD-10-CM | POA: Diagnosis not present

## 2018-11-19 DIAGNOSIS — M545 Low back pain: Secondary | ICD-10-CM | POA: Diagnosis not present

## 2018-11-19 DIAGNOSIS — G894 Chronic pain syndrome: Secondary | ICD-10-CM | POA: Diagnosis not present

## 2018-11-21 ENCOUNTER — Ambulatory Visit
Admission: RE | Admit: 2018-11-21 | Discharge: 2018-11-21 | Disposition: A | Discharge status: Home / Self Care | Payer: Medicare HMO | Source: Ambulatory Visit | Attending: Family Medicine | Admitting: Family Medicine

## 2018-11-21 DIAGNOSIS — Z1231 Encounter for screening mammogram for malignant neoplasm of breast: Secondary | ICD-10-CM

## 2018-11-25 DIAGNOSIS — J310 Chronic rhinitis: Secondary | ICD-10-CM | POA: Diagnosis not present

## 2018-11-25 DIAGNOSIS — H6122 Impacted cerumen, left ear: Secondary | ICD-10-CM | POA: Diagnosis not present

## 2018-11-25 DIAGNOSIS — H93293 Other abnormal auditory perceptions, bilateral: Secondary | ICD-10-CM | POA: Diagnosis not present

## 2018-11-25 DIAGNOSIS — H6983 Other specified disorders of Eustachian tube, bilateral: Secondary | ICD-10-CM | POA: Diagnosis not present

## 2018-11-26 DIAGNOSIS — M6281 Muscle weakness (generalized): Secondary | ICD-10-CM | POA: Diagnosis not present

## 2018-11-26 DIAGNOSIS — G894 Chronic pain syndrome: Secondary | ICD-10-CM | POA: Diagnosis not present

## 2018-11-26 DIAGNOSIS — M545 Low back pain: Secondary | ICD-10-CM | POA: Diagnosis not present

## 2018-12-10 DIAGNOSIS — M545 Low back pain: Secondary | ICD-10-CM | POA: Diagnosis not present

## 2018-12-10 DIAGNOSIS — M6281 Muscle weakness (generalized): Secondary | ICD-10-CM | POA: Diagnosis not present

## 2018-12-10 DIAGNOSIS — G894 Chronic pain syndrome: Secondary | ICD-10-CM | POA: Diagnosis not present

## 2018-12-17 DIAGNOSIS — G894 Chronic pain syndrome: Secondary | ICD-10-CM | POA: Diagnosis not present

## 2018-12-17 DIAGNOSIS — M6281 Muscle weakness (generalized): Secondary | ICD-10-CM | POA: Diagnosis not present

## 2018-12-17 DIAGNOSIS — M545 Low back pain: Secondary | ICD-10-CM | POA: Diagnosis not present

## 2018-12-30 DIAGNOSIS — I10 Essential (primary) hypertension: Secondary | ICD-10-CM | POA: Diagnosis not present

## 2018-12-30 DIAGNOSIS — R42 Dizziness and giddiness: Secondary | ICD-10-CM | POA: Diagnosis not present

## 2018-12-30 DIAGNOSIS — Z6825 Body mass index (BMI) 25.0-25.9, adult: Secondary | ICD-10-CM | POA: Diagnosis not present

## 2018-12-30 DIAGNOSIS — R5383 Other fatigue: Secondary | ICD-10-CM | POA: Diagnosis not present

## 2018-12-30 DIAGNOSIS — E7800 Pure hypercholesterolemia, unspecified: Secondary | ICD-10-CM | POA: Diagnosis not present

## 2018-12-30 DIAGNOSIS — R002 Palpitations: Secondary | ICD-10-CM | POA: Diagnosis not present

## 2018-12-30 DIAGNOSIS — R05 Cough: Secondary | ICD-10-CM | POA: Diagnosis not present

## 2019-01-05 DIAGNOSIS — R69 Illness, unspecified: Secondary | ICD-10-CM | POA: Diagnosis not present

## 2019-01-05 DIAGNOSIS — Z6825 Body mass index (BMI) 25.0-25.9, adult: Secondary | ICD-10-CM | POA: Diagnosis not present

## 2019-01-05 DIAGNOSIS — M8580 Other specified disorders of bone density and structure, unspecified site: Secondary | ICD-10-CM | POA: Diagnosis not present

## 2019-01-05 DIAGNOSIS — K219 Gastro-esophageal reflux disease without esophagitis: Secondary | ICD-10-CM | POA: Diagnosis not present

## 2019-01-05 DIAGNOSIS — N183 Chronic kidney disease, stage 3 (moderate): Secondary | ICD-10-CM | POA: Diagnosis not present

## 2019-01-05 DIAGNOSIS — I129 Hypertensive chronic kidney disease with stage 1 through stage 4 chronic kidney disease, or unspecified chronic kidney disease: Secondary | ICD-10-CM | POA: Diagnosis not present

## 2019-01-05 DIAGNOSIS — I10 Essential (primary) hypertension: Secondary | ICD-10-CM | POA: Diagnosis not present

## 2019-01-05 DIAGNOSIS — Z0000 Encounter for general adult medical examination without abnormal findings: Secondary | ICD-10-CM | POA: Diagnosis not present

## 2019-01-05 DIAGNOSIS — G43009 Migraine without aura, not intractable, without status migrainosus: Secondary | ICD-10-CM | POA: Diagnosis not present

## 2019-01-05 DIAGNOSIS — E7800 Pure hypercholesterolemia, unspecified: Secondary | ICD-10-CM | POA: Diagnosis not present

## 2019-01-06 DIAGNOSIS — J343 Hypertrophy of nasal turbinates: Secondary | ICD-10-CM | POA: Diagnosis not present

## 2019-01-06 DIAGNOSIS — J310 Chronic rhinitis: Secondary | ICD-10-CM | POA: Diagnosis not present

## 2019-01-06 DIAGNOSIS — J342 Deviated nasal septum: Secondary | ICD-10-CM | POA: Diagnosis not present

## 2019-02-18 DIAGNOSIS — M7130 Other bursal cyst, unspecified site: Secondary | ICD-10-CM | POA: Diagnosis not present

## 2019-02-18 DIAGNOSIS — E7800 Pure hypercholesterolemia, unspecified: Secondary | ICD-10-CM | POA: Diagnosis not present

## 2019-02-18 DIAGNOSIS — N183 Chronic kidney disease, stage 3 (moderate): Secondary | ICD-10-CM | POA: Diagnosis not present

## 2019-02-18 DIAGNOSIS — D225 Melanocytic nevi of trunk: Secondary | ICD-10-CM | POA: Diagnosis not present

## 2019-02-18 DIAGNOSIS — I129 Hypertensive chronic kidney disease with stage 1 through stage 4 chronic kidney disease, or unspecified chronic kidney disease: Secondary | ICD-10-CM | POA: Diagnosis not present

## 2019-02-18 DIAGNOSIS — R69 Illness, unspecified: Secondary | ICD-10-CM | POA: Diagnosis not present

## 2019-02-18 DIAGNOSIS — I872 Venous insufficiency (chronic) (peripheral): Secondary | ICD-10-CM | POA: Diagnosis not present

## 2019-02-18 DIAGNOSIS — Z1283 Encounter for screening for malignant neoplasm of skin: Secondary | ICD-10-CM | POA: Diagnosis not present

## 2019-02-24 DIAGNOSIS — R69 Illness, unspecified: Secondary | ICD-10-CM | POA: Diagnosis not present

## 2019-02-28 DIAGNOSIS — R509 Fever, unspecified: Secondary | ICD-10-CM | POA: Diagnosis not present

## 2019-02-28 DIAGNOSIS — J029 Acute pharyngitis, unspecified: Secondary | ICD-10-CM | POA: Diagnosis not present

## 2019-02-28 DIAGNOSIS — J309 Allergic rhinitis, unspecified: Secondary | ICD-10-CM | POA: Diagnosis not present

## 2019-03-30 ENCOUNTER — Other Ambulatory Visit: Payer: Self-pay

## 2019-03-30 DIAGNOSIS — Z20822 Contact with and (suspected) exposure to covid-19: Secondary | ICD-10-CM

## 2019-03-31 LAB — NOVEL CORONAVIRUS, NAA: SARS-CoV-2, NAA: NOT DETECTED

## 2019-05-11 DIAGNOSIS — H409 Unspecified glaucoma: Secondary | ICD-10-CM | POA: Diagnosis not present

## 2019-05-11 DIAGNOSIS — G8929 Other chronic pain: Secondary | ICD-10-CM | POA: Diagnosis not present

## 2019-05-11 DIAGNOSIS — J309 Allergic rhinitis, unspecified: Secondary | ICD-10-CM | POA: Diagnosis not present

## 2019-05-11 DIAGNOSIS — E785 Hyperlipidemia, unspecified: Secondary | ICD-10-CM | POA: Diagnosis not present

## 2019-05-11 DIAGNOSIS — I10 Essential (primary) hypertension: Secondary | ICD-10-CM | POA: Diagnosis not present

## 2019-05-11 DIAGNOSIS — G43909 Migraine, unspecified, not intractable, without status migrainosus: Secondary | ICD-10-CM | POA: Diagnosis not present

## 2019-05-11 DIAGNOSIS — M1990 Unspecified osteoarthritis, unspecified site: Secondary | ICD-10-CM | POA: Diagnosis not present

## 2019-05-11 DIAGNOSIS — Z79899 Other long term (current) drug therapy: Secondary | ICD-10-CM | POA: Diagnosis not present

## 2019-05-11 DIAGNOSIS — R69 Illness, unspecified: Secondary | ICD-10-CM | POA: Diagnosis not present

## 2019-05-11 DIAGNOSIS — K219 Gastro-esophageal reflux disease without esophagitis: Secondary | ICD-10-CM | POA: Diagnosis not present

## 2019-05-28 DIAGNOSIS — I129 Hypertensive chronic kidney disease with stage 1 through stage 4 chronic kidney disease, or unspecified chronic kidney disease: Secondary | ICD-10-CM | POA: Diagnosis not present

## 2019-05-28 DIAGNOSIS — N183 Chronic kidney disease, stage 3 unspecified: Secondary | ICD-10-CM | POA: Diagnosis not present

## 2019-05-28 DIAGNOSIS — H401212 Low-tension glaucoma, right eye, moderate stage: Secondary | ICD-10-CM | POA: Diagnosis not present

## 2019-05-28 DIAGNOSIS — R69 Illness, unspecified: Secondary | ICD-10-CM | POA: Diagnosis not present

## 2019-05-28 DIAGNOSIS — E7800 Pure hypercholesterolemia, unspecified: Secondary | ICD-10-CM | POA: Diagnosis not present

## 2019-06-17 DIAGNOSIS — I129 Hypertensive chronic kidney disease with stage 1 through stage 4 chronic kidney disease, or unspecified chronic kidney disease: Secondary | ICD-10-CM | POA: Diagnosis not present

## 2019-06-17 DIAGNOSIS — E7800 Pure hypercholesterolemia, unspecified: Secondary | ICD-10-CM | POA: Diagnosis not present

## 2019-06-17 DIAGNOSIS — R69 Illness, unspecified: Secondary | ICD-10-CM | POA: Diagnosis not present

## 2019-06-24 DIAGNOSIS — M545 Low back pain: Secondary | ICD-10-CM | POA: Diagnosis not present

## 2019-06-24 DIAGNOSIS — M6281 Muscle weakness (generalized): Secondary | ICD-10-CM | POA: Diagnosis not present

## 2019-06-24 DIAGNOSIS — G894 Chronic pain syndrome: Secondary | ICD-10-CM | POA: Diagnosis not present

## 2019-06-25 DIAGNOSIS — H401212 Low-tension glaucoma, right eye, moderate stage: Secondary | ICD-10-CM | POA: Diagnosis not present

## 2019-06-30 DIAGNOSIS — M545 Low back pain: Secondary | ICD-10-CM | POA: Diagnosis not present

## 2019-06-30 DIAGNOSIS — M6281 Muscle weakness (generalized): Secondary | ICD-10-CM | POA: Diagnosis not present

## 2019-06-30 DIAGNOSIS — G894 Chronic pain syndrome: Secondary | ICD-10-CM | POA: Diagnosis not present

## 2019-07-02 DIAGNOSIS — M545 Low back pain: Secondary | ICD-10-CM | POA: Diagnosis not present

## 2019-07-02 DIAGNOSIS — G894 Chronic pain syndrome: Secondary | ICD-10-CM | POA: Diagnosis not present

## 2019-07-02 DIAGNOSIS — M6281 Muscle weakness (generalized): Secondary | ICD-10-CM | POA: Diagnosis not present

## 2019-07-06 ENCOUNTER — Emergency Department (HOSPITAL_COMMUNITY)
Admission: EM | Admit: 2019-07-06 | Discharge: 2019-07-06 | Disposition: A | Discharge status: Home / Self Care | Payer: Medicare HMO | Attending: Emergency Medicine | Admitting: Emergency Medicine

## 2019-07-06 ENCOUNTER — Emergency Department (HOSPITAL_COMMUNITY): Payer: Medicare HMO

## 2019-07-06 ENCOUNTER — Other Ambulatory Visit: Payer: Self-pay

## 2019-07-06 ENCOUNTER — Encounter (HOSPITAL_COMMUNITY): Payer: Self-pay | Admitting: Emergency Medicine

## 2019-07-06 DIAGNOSIS — Z20822 Contact with and (suspected) exposure to covid-19: Secondary | ICD-10-CM | POA: Diagnosis not present

## 2019-07-06 DIAGNOSIS — I10 Essential (primary) hypertension: Secondary | ICD-10-CM | POA: Diagnosis not present

## 2019-07-06 DIAGNOSIS — Z79899 Other long term (current) drug therapy: Secondary | ICD-10-CM | POA: Insufficient documentation

## 2019-07-06 DIAGNOSIS — R509 Fever, unspecified: Secondary | ICD-10-CM | POA: Diagnosis not present

## 2019-07-06 DIAGNOSIS — R1013 Epigastric pain: Secondary | ICD-10-CM | POA: Insufficient documentation

## 2019-07-06 DIAGNOSIS — R109 Unspecified abdominal pain: Secondary | ICD-10-CM | POA: Diagnosis not present

## 2019-07-06 DIAGNOSIS — R197 Diarrhea, unspecified: Secondary | ICD-10-CM | POA: Diagnosis not present

## 2019-07-06 DIAGNOSIS — R000 Tachycardia, unspecified: Secondary | ICD-10-CM | POA: Diagnosis not present

## 2019-07-06 LAB — CBC
HCT: 43.6 % (ref 36.0–46.0)
Hemoglobin: 15.2 g/dL — ABNORMAL HIGH (ref 12.0–15.0)
MCH: 31.5 pg (ref 26.0–34.0)
MCHC: 34.9 g/dL (ref 30.0–36.0)
MCV: 90.5 fL (ref 80.0–100.0)
Platelets: 193 10*3/uL (ref 150–400)
RBC: 4.82 MIL/uL (ref 3.87–5.11)
RDW: 12.7 % (ref 11.5–15.5)
WBC: 5.8 10*3/uL (ref 4.0–10.5)
nRBC: 0 % (ref 0.0–0.2)

## 2019-07-06 LAB — COMPREHENSIVE METABOLIC PANEL
ALT: 25 U/L (ref 0–44)
AST: 31 U/L (ref 15–41)
Albumin: 4.2 g/dL (ref 3.5–5.0)
Alkaline Phosphatase: 68 U/L (ref 38–126)
Anion gap: 10 (ref 5–15)
BUN: 13 mg/dL (ref 8–23)
CO2: 24 mmol/L (ref 22–32)
Calcium: 9.7 mg/dL (ref 8.9–10.3)
Chloride: 104 mmol/L (ref 98–111)
Creatinine, Ser: 1.06 mg/dL — ABNORMAL HIGH (ref 0.44–1.00)
GFR calc Af Amer: >60 mL/min (ref >60)
GFR calc non Af Amer: 54 mL/min — ABNORMAL LOW (ref >60)
Glucose, Bld: 116 mg/dL — ABNORMAL HIGH (ref 70–99)
Potassium: 4 mmol/L (ref 3.5–5.1)
Sodium: 138 mmol/L (ref 135–145)
Total Bilirubin: 0.4 mg/dL (ref 0.3–1.2)
Total Protein: 6.7 g/dL (ref 6.5–8.1)

## 2019-07-06 LAB — URINALYSIS, ROUTINE W REFLEX MICROSCOPIC
Bilirubin Urine: NEGATIVE
Glucose, UA: NEGATIVE mg/dL
Hgb urine dipstick: NEGATIVE
Ketones, ur: NEGATIVE mg/dL
Leukocytes,Ua: NEGATIVE
Nitrite: NEGATIVE
Protein, ur: NEGATIVE mg/dL
Specific Gravity, Urine: 1.005 (ref 1.005–1.030)
pH: 6 (ref 5.0–8.0)

## 2019-07-06 LAB — LIPASE, BLOOD: Lipase: 27 U/L (ref 11–51)

## 2019-07-06 LAB — SARS CORONAVIRUS 2 (TAT 6-24 HRS): SARS Coronavirus 2: NEGATIVE

## 2019-07-06 MED ORDER — IOHEXOL 300 MG/ML  SOLN
100.0000 mL | Freq: Once | INTRAMUSCULAR | Status: AC | PRN
  Administered 2019-07-06: 100 mL via INTRAVENOUS

## 2019-07-06 MED ORDER — SUCRALFATE 1 G PO TABS: 1.0000 g | ORAL_TABLET | Freq: Three times a day (TID) | ORAL | 0 refills | Status: DC

## 2019-07-06 MED ORDER — LIDOCAINE VISCOUS HCL 2 % MT SOLN
15.0000 mL | Freq: Once | OROMUCOSAL | Status: AC
  Administered 2019-07-06: 15 mL via ORAL
  Filled 2019-07-06: qty 15

## 2019-07-06 MED ORDER — LACTATED RINGERS IV BOLUS
1000.0000 mL | Freq: Once | INTRAVENOUS | Status: AC
  Administered 2019-07-06: 1000 mL via INTRAVENOUS

## 2019-07-06 MED ORDER — ALUM & MAG HYDROXIDE-SIMETH 200-200-20 MG/5ML PO SUSP
30.0000 mL | Freq: Once | ORAL | Status: AC
  Administered 2019-07-06: 30 mL via ORAL
  Filled 2019-07-06: qty 30

## 2019-07-06 NOTE — Discharge Instructions (Addendum)
The blood work you had today looked normal with normal liver, kidney function and normal white blood cell count.  CAT scan did not show anything new going on in your abdomen except for a hiatal hernia.  You did have some thickening in your lung that needs a CAT scan ordered by your doctor in the future just to further evaluate what is there.  Your Covid test should come back later today or tomorrow and you can find that on your MyChart account.  You can continue to take Tylenol for the fevers and you can use the Carafate to help for possible indigestion and acid.  Continue to take your Protonix. If you start having vomiting that is not stopping, the pain becomes severe, you start feeling weak or having shortness of breath please return to the emergency room immediately.

## 2019-07-06 NOTE — ED Provider Notes (Signed)
[facility] EMERGENCY DEPARTMENT Provider Note   CSN: [id number] Arrival date & time: 07/06/19  1044     History Chief Complaint  Patient presents with  . Abdominal Pain    Anna Arnold is a 69 y.o. female.  The history is provided by the patient.  Abdominal Pain Pain location:  Epigastric Pain quality: aching, cramping and gnawing   Pain radiates to:  Back Pain severity:  Moderate Onset quality:  Gradual Duration:  24 hours Timing:  Constant Progression:  Worsening Chronicity:  New Context comment:  Ate a shrimp dish at [employer] on Saturday night and felt fine but woke up yesterday morning with abdominal pain that is gradually worsened over the last 24 hours Relieved by:  Nothing Worsened by:  Eating, movement and palpation Ineffective treatments:  Acetaminophen (Tried taking Tums but has not helped) Associated symptoms: anorexia, diarrhea and fever   Associated symptoms: no chest pain, no cough, no dysuria, no nausea and no vomiting   Associated symptoms comment:  Noted a temperature of 99 yesterday but this morning temperature was 100.3 prior to taking Tylenol.  Also diarrhea started this morning.  Only abdominal surgery is a C-section Risk factors: no alcohol abuse, no NSAID use and no recent hospitalization        Past Medical History:  Diagnosis Date  . Anxiety   . Arthritis   . Cataracts, bilateral   . Cervical spondylosis   . Degenerative disc disease, lumbar   . Depression   . Fibromyalgia   . GERD (gastroesophageal reflux disease)   . Glaucoma   . Hypertension   . Migraines   . Osteopenia   . Seasonal allergies   . Sleep disorder     Patient Active Problem List   Diagnosis Date Noted  . Cervicalgia 02/13/2016  . Chronic insomnia 02/13/2016  . Chronic ethmoidal sinusitis 02/13/2016  . Dizziness 11/25/2014  . HYPERLIPIDEMIA 08/07/2007  . ANXIETY DEPRESSION 08/07/2007  . GLAUCOMA 08/07/2007  . HYPERTENSION 08/07/2007    . DVT 08/07/2007  . ESOPHAGEAL STRICTURE 08/07/2007  . DYSKINESIA OF ESOPHAGUS 08/07/2007  . GERD 08/07/2007  . HIATAL HERNIA 08/07/2007  . SPONDYLOSIS, CERVICAL 08/07/2007  . FIBROMYALGIA 08/07/2007  . SLEEP APNEA 08/07/2007  . HEART MURMUR, BENIGN 08/07/2007  . ANEMIA, IRON DEFICIENCY, HX OF 08/07/2007    Past Surgical History:  Procedure Laterality Date  . bunionectomy Right 2008  . CATARACT EXTRACTION Bilateral 2015-2016  . Krebs  . GLAUCOMA SURGERY Bilateral 2013  . HAMMER TOE SURGERY Right 2008  . ROTATOR CUFF REPAIR Right 2012     OB History   No obstetric history on file.     Family History  Problem Relation Age of Onset  . Cancer Mother   . Depression Mother   . Heart disease Father   . Heart attack Brother   . Depression Brother   . Colon cancer Neg Hx   . Colon polyps Neg Hx   . Esophageal cancer Neg Hx   . Rectal cancer Neg Hx   . Stomach cancer Neg Hx     Social History   Tobacco Use  . Smoking status: Never Smoker  . Smokeless tobacco: Never Used  Substance Use Topics  . Alcohol use: No  . Drug use: No    Home Medications Prior to Admission medications   Medication Sig Start Date End Date Taking? Authorizing Provider  Acetaminophen (TYLENOL ARTHRITIS PAIN PO) Take 650 mg by mouth 2 (  two) times daily as needed (for arthritis pain).    [provider]  bimatoprost (LUMIGAN) 0.01 % SOLN Place 1 drop into both eyes at bedtime.    [provider]  buPROPion (WELLBUTRIN SR) 100 MG 12 hr tablet Take 100 mg by mouth daily.    [provider]  Cholecalciferol (VITAMIN D3) 1000 units CAPS Take by mouth.    [provider]  fluticasone (FLONASE) 50 MCG/ACT nasal spray Place 1 spray into both nostrils 2 (two) times daily as needed for allergies or rhinitis.    [provider]  losartan (COZAAR) 50 MG tablet Take 50 mg by mouth daily.    [provider]  Multiple Minerals-Vitamins  (CALCIUM-MAGNESIUM-ZINC-D3 PO) Take by mouth.    [provider]  MULTIPLE VITAMIN PO Take by mouth.    [provider]  Omega-3 Fatty Acids (FISH OIL ULTRA) 1400 MG CAPS Take by mouth.    [provider]  pantoprazole (PROTONIX) 40 MG tablet Take 40 mg by mouth daily.    [provider]  Polyethyl Glycol-Propyl Glycol (SYSTANE PRESERVATIVE FREE OP) Apply to eye as needed.    [provider]  SUMAtriptan (IMITREX) 100 MG tablet Take 100 mg by mouth every 2 (two) hours as needed for migraine. Take 1/2 tablet for migraine. May repeat in 2 hours if headache persists or recurs.    [provider]    Allergies    Amitriptyline, Atenolol, Augmentin [amoxicillin-pot clavulanate], Biaxin [clarithromycin], Ceftin [cefuroxime axetil], Chlorthalidone, Citalopram hydrobromide, Erythromycin, Fluoxetine, Lexapro [escitalopram oxalate], Meloxicam, Potassium chloride, Pravastatin, Reglan [metoclopramide], Sertraline hcl, Simvastatin, Singulair [montelukast sodium], Viibryd [vilazodone hcl], Zegerid [omeprazole-sodium bicarbonate], and Zegerid [omeprazole]  Review of Systems   Review of Systems  Constitutional: Positive for fever.  Respiratory: Negative for cough.   Cardiovascular: Negative for chest pain.  Gastrointestinal: Positive for abdominal pain, anorexia and diarrhea. Negative for nausea and vomiting.  Genitourinary: Negative for dysuria.  All other systems reviewed and are negative.   Physical Exam Updated Vital Signs BP (!) 146/99 (BP Location: Right Arm)   Pulse (!) 103   Temp 99.3 F (37.4 C) (Oral)   Resp 16   Ht 5' (1.524 m)   Wt 59.9 kg   SpO2 97%   BMI 25.78 kg/m   Physical Exam Vitals and nursing note reviewed.  Constitutional:      General: She is not in acute distress.    Appearance: She is well-developed and normal weight.  HENT:     Head: Normocephalic and atraumatic.  Eyes:     Pupils: Pupils are equal, round, and  reactive to light.  Cardiovascular:     Rate and Rhythm: Regular rhythm. Tachycardia present.     Heart sounds: Normal heart sounds. No murmur. No friction rub.  Pulmonary:     Effort: Pulmonary effort is normal.     Breath sounds: Normal breath sounds. No wheezing or rales.  Abdominal:     General: Abdomen is flat. Bowel sounds are normal. There is no distension.     Palpations: Abdomen is soft.     Tenderness: There is abdominal tenderness in the epigastric area. There is guarding. There is no right CVA tenderness, left CVA tenderness or rebound.     Hernia: No hernia is present.  Musculoskeletal:        General: No tenderness. Normal range of motion.     Comments: No edema  Skin:    General: Skin is warm and dry.  Findings: No rash.  Neurological:     Mental Status: She is alert and oriented to person, place, and time.     Cranial Nerves: No cranial nerve deficit.  Psychiatric:        Behavior: Behavior normal.     ED Results / Procedures / Treatments   Labs (all labs ordered are listed, but only abnormal results are displayed) Labs Reviewed  COMPREHENSIVE METABOLIC PANEL - Abnormal; Notable for the following components:      Result Value   Glucose, Bld 116 (*)    Creatinine, Ser 1.06 (*)    GFR calc non Af Amer 54 (*)    All other components within normal limits  CBC - Abnormal; Notable for the following components:   Hemoglobin 15.2 (*)    All other components within normal limits  SARS CORONAVIRUS 2 (TAT 6-24 HRS)  LIPASE, BLOOD  URINALYSIS, ROUTINE W REFLEX MICROSCOPIC    EKG EKG Interpretation  Date/Time:  Monday July 06 2019 11:42:38 EST Ventricular Rate:  95 PR Interval:    QRS Duration: 98 QT Interval:  347 QTC Calculation: 437 R Axis:   116 Text Interpretation: Sinus rhythm Right axis deviation No significant change since last tracing Confirmed by Blanchie Dessert [phone]) on 07/06/2019 11:51:12 AM   Radiology CT ABDOMEN PELVIS W  CONTRAST  Result Date: 07/06/2019 CLINICAL DATA:  Epigastric pain since yesterday morning with diarrhea. Mid upper back pain. EXAM: CT ABDOMEN AND PELVIS WITH CONTRAST TECHNIQUE: Multidetector CT imaging of the abdomen and pelvis was performed using the standard protocol following bolus administration of intravenous contrast. CONTRAST:  143mL OMNIPAQUE IOHEXOL 300 MG/ML  SOLN COMPARISON:  None. FINDINGS: Lower chest: Mild basilar subpleural ground-glass with mucoid impaction in the right middle lobe. Heart size normal. No pericardial or pleural effusion. Moderate hiatal hernia. Hepatobiliary: A few scattered low-attenuation lesions in the liver measure up to 8 mm and are likely cysts. Liver and gallbladder are otherwise unremarkable. No biliary ductal dilatation. Pancreas: Negative. Spleen: Negative. Adrenals/Urinary Tract: Right adrenal gland is unremarkable. Thickening of the body and medial limb left adrenal gland. Renal sinus cysts bilaterally. Ureters are decompressed. Bladder is grossly unremarkable. Stomach/Bowel: Moderate hiatal hernia. Stomach, small bowel, appendix and colon are otherwise unremarkable. Vascular/Lymphatic: Atherosclerotic calcification of the aorta without aneurysm. No pathologically enlarged lymph nodes. Reproductive: Uterus is visualized.  No adnexal mass. Other: Bilateral inguinal hernias contain fat. No free fluid. Mesenteries and peritoneum are unremarkable. Musculoskeletal: Degenerative changes in the spine. No worrisome lytic or sclerotic lesions. Mild T11 and T12 anterior wedging, age indeterminate. IMPRESSION: 1. No acute findings to explain the patient's abdominal pain. 2. Mild wedging of T11 and T12, age indeterminate. 3. Mild basilar subpleural ground-glass can be seen in the setting of interstitial lung disease. If further evaluation is desired, nonemergent high-resolution chest CT without contrast is recommended. 4. Moderate hiatal hernia. 5.  Aortic atherosclerosis  (ICD10-I70.0). Electronically Signed   By: Lorin Picket M.D.   On: 07/06/2019 13:44    Procedures Procedures (including critical care time)  Medications Ordered in ED Medications  lactated ringers bolus 1,000 mL (has no administration in time range)    ED Course  I have reviewed the triage vital signs and the nursing notes.  Pertinent labs & imaging results that were available during my care of the patient were reviewed by me and considered in my medical decision making (see chart for details).    MDM Rules/Calculators/A&P  69 year old female presenting with abdominal pain that is been present now for 24 hours.  She describes it mostly in her epigastric region and radiates into her back.  Patient appears uncomfortable on exam and has guarding in the epigastric and minimally in the right upper quadrant.  She has had no vomiting but did start having diarrhea this morning as well as a temperature of 100.3.  She denies cough or respiratory symptoms.  Concern for cholecystitis versus perforated gastric ulcer versus hepatitis versus pancreatitis.  Low suspicion for Covid at this time.  Patient is refusing anything for pain at this time but she was given IV fluids.  EKG without acute findings.  1:52 PM Patient's labs including CBC, CMP, lipase and UA all without acute findings.  CT ordered due to patient significant discomfort.  Patient's labs are consistent with a moderate hiatal hernia but otherwise no other acute findings.  She does have mild basilar subpleural groundglass opacity seen in the lungs that they said could be from an interstitial lung disease and recommended nonemergent CT in the future.  2:42 PM Patient still having discomfort here and persistent temperature of 99.  Discussed with her this could be related to the hiatal hernia.  Cannot 100% rule out gallbladder pathology but CT shows no sign of surrounding edema, inflammation or acute issues with the  gallbladder and no elevated LFTs or lipase.  Nothing to indicate ultrasound at this time.  Patient given a GI cocktail.  Will swab for Covid as patient has had low-grade temperatures in the setting and possibility that that could be the source.  However feel patient is otherwise well-appearing to go home.  She was instructed to continue her Protonix and was given Carafate.  She will follow-up with her doctor if symptoms are not improving.  She was given strict return precautions.  Anna Arnold was evaluated in Emergency Department on 07/06/2019 for the symptoms described in the history of present illness. She was evaluated in the context of the global COVID-19 pandemic, which necessitated consideration that the patient might be at risk for infection with the SARS-CoV-2 virus that causes COVID-19. Institutional protocols and algorithms that pertain to the evaluation of patients at risk for COVID-19 are in a state of rapid change based on information released by regulatory bodies including the CDC and federal and state organizations. These policies and algorithms were followed during the patient's care in the ED.   Final Clinical Impression(s) / ED Diagnoses Final diagnoses:  Epigastric pain  Fever, unspecified fever cause    Rx / DC Orders ED Discharge Orders         Ordered    sucralfate (CARAFATE) 1 g tablet  3 times daily with meals & bedtime     07/06/19 1450           Blanchie Dessert, MD 07/06/19 1450

## 2019-07-06 NOTE — ED Triage Notes (Signed)
Pt endorses center upper abd pain since yesterday morning with diarrhea. Endorses mid upper back pain

## 2019-07-08 DIAGNOSIS — K529 Noninfective gastroenteritis and colitis, unspecified: Secondary | ICD-10-CM | POA: Diagnosis not present

## 2019-07-08 DIAGNOSIS — Z6825 Body mass index (BMI) 25.0-25.9, adult: Secondary | ICD-10-CM | POA: Diagnosis not present

## 2019-07-08 DIAGNOSIS — S22080D Wedge compression fracture of T11-T12 vertebra, subsequent encounter for fracture with routine healing: Secondary | ICD-10-CM | POA: Diagnosis not present

## 2019-07-08 DIAGNOSIS — I700 Atherosclerosis of aorta: Secondary | ICD-10-CM | POA: Diagnosis not present

## 2019-07-08 DIAGNOSIS — M8580 Other specified disorders of bone density and structure, unspecified site: Secondary | ICD-10-CM | POA: Diagnosis not present

## 2019-07-09 ENCOUNTER — Other Ambulatory Visit: Payer: Self-pay | Admitting: Family Medicine

## 2019-07-09 DIAGNOSIS — M8580 Other specified disorders of bone density and structure, unspecified site: Secondary | ICD-10-CM

## 2019-07-13 ENCOUNTER — Ambulatory Visit
Admission: RE | Admit: 2019-07-13 | Discharge: 2019-07-13 | Disposition: A | Discharge status: Home / Self Care | Payer: Medicare HMO | Source: Ambulatory Visit | Attending: Family Medicine | Admitting: Family Medicine

## 2019-07-13 ENCOUNTER — Other Ambulatory Visit: Payer: Self-pay

## 2019-07-13 ENCOUNTER — Other Ambulatory Visit: Payer: Self-pay | Admitting: Family Medicine

## 2019-07-13 DIAGNOSIS — S22080D Wedge compression fracture of T11-T12 vertebra, subsequent encounter for fracture with routine healing: Secondary | ICD-10-CM

## 2019-07-13 DIAGNOSIS — M85852 Other specified disorders of bone density and structure, left thigh: Secondary | ICD-10-CM | POA: Diagnosis not present

## 2019-07-13 DIAGNOSIS — Z780 Asymptomatic menopausal state: Secondary | ICD-10-CM | POA: Diagnosis not present

## 2019-07-13 DIAGNOSIS — S22080A Wedge compression fracture of T11-T12 vertebra, initial encounter for closed fracture: Secondary | ICD-10-CM | POA: Diagnosis not present

## 2019-07-13 DIAGNOSIS — M8580 Other specified disorders of bone density and structure, unspecified site: Secondary | ICD-10-CM

## 2019-07-14 DIAGNOSIS — M545 Low back pain: Secondary | ICD-10-CM | POA: Diagnosis not present

## 2019-07-14 DIAGNOSIS — G894 Chronic pain syndrome: Secondary | ICD-10-CM | POA: Diagnosis not present

## 2019-07-14 DIAGNOSIS — M6281 Muscle weakness (generalized): Secondary | ICD-10-CM | POA: Diagnosis not present

## 2019-07-15 DIAGNOSIS — G894 Chronic pain syndrome: Secondary | ICD-10-CM | POA: Diagnosis not present

## 2019-07-15 DIAGNOSIS — M6281 Muscle weakness (generalized): Secondary | ICD-10-CM | POA: Diagnosis not present

## 2019-07-15 DIAGNOSIS — M545 Low back pain: Secondary | ICD-10-CM | POA: Diagnosis not present

## 2019-07-21 DIAGNOSIS — G894 Chronic pain syndrome: Secondary | ICD-10-CM | POA: Diagnosis not present

## 2019-07-21 DIAGNOSIS — M545 Low back pain: Secondary | ICD-10-CM | POA: Diagnosis not present

## 2019-07-21 DIAGNOSIS — M6281 Muscle weakness (generalized): Secondary | ICD-10-CM | POA: Diagnosis not present

## 2019-07-22 DIAGNOSIS — M810 Age-related osteoporosis without current pathological fracture: Secondary | ICD-10-CM | POA: Diagnosis not present

## 2019-07-22 DIAGNOSIS — M4854XD Collapsed vertebra, not elsewhere classified, thoracic region, subsequent encounter for fracture with routine healing: Secondary | ICD-10-CM | POA: Diagnosis not present

## 2019-07-22 DIAGNOSIS — R935 Abnormal findings on diagnostic imaging of other abdominal regions, including retroperitoneum: Secondary | ICD-10-CM | POA: Diagnosis not present

## 2019-07-23 ENCOUNTER — Other Ambulatory Visit: Payer: Self-pay | Admitting: Family Medicine

## 2019-07-23 ENCOUNTER — Other Ambulatory Visit (HOSPITAL_COMMUNITY): Payer: Self-pay | Admitting: Family Medicine

## 2019-07-23 DIAGNOSIS — M545 Low back pain: Secondary | ICD-10-CM | POA: Diagnosis not present

## 2019-07-23 DIAGNOSIS — M6281 Muscle weakness (generalized): Secondary | ICD-10-CM | POA: Diagnosis not present

## 2019-07-23 DIAGNOSIS — J849 Interstitial pulmonary disease, unspecified: Secondary | ICD-10-CM

## 2019-07-23 DIAGNOSIS — R9389 Abnormal findings on diagnostic imaging of other specified body structures: Secondary | ICD-10-CM

## 2019-07-23 DIAGNOSIS — G894 Chronic pain syndrome: Secondary | ICD-10-CM | POA: Diagnosis not present

## 2019-07-27 DIAGNOSIS — G894 Chronic pain syndrome: Secondary | ICD-10-CM | POA: Diagnosis not present

## 2019-07-27 DIAGNOSIS — M545 Low back pain: Secondary | ICD-10-CM | POA: Diagnosis not present

## 2019-07-27 DIAGNOSIS — M6281 Muscle weakness (generalized): Secondary | ICD-10-CM | POA: Diagnosis not present

## 2019-07-30 DIAGNOSIS — M545 Low back pain: Secondary | ICD-10-CM | POA: Diagnosis not present

## 2019-07-30 DIAGNOSIS — G894 Chronic pain syndrome: Secondary | ICD-10-CM | POA: Diagnosis not present

## 2019-07-30 DIAGNOSIS — M6281 Muscle weakness (generalized): Secondary | ICD-10-CM | POA: Diagnosis not present

## 2019-08-04 DIAGNOSIS — M6281 Muscle weakness (generalized): Secondary | ICD-10-CM | POA: Diagnosis not present

## 2019-08-04 DIAGNOSIS — G894 Chronic pain syndrome: Secondary | ICD-10-CM | POA: Diagnosis not present

## 2019-08-04 DIAGNOSIS — M545 Low back pain: Secondary | ICD-10-CM | POA: Diagnosis not present

## 2019-08-05 ENCOUNTER — Other Ambulatory Visit: Payer: Self-pay

## 2019-08-05 ENCOUNTER — Ambulatory Visit (HOSPITAL_COMMUNITY)
Admission: RE | Admit: 2019-08-05 | Discharge: 2019-08-05 | Disposition: A | Discharge status: Home / Self Care | Payer: Medicare HMO | Source: Ambulatory Visit | Attending: Family Medicine | Admitting: Family Medicine

## 2019-08-05 DIAGNOSIS — G894 Chronic pain syndrome: Secondary | ICD-10-CM | POA: Diagnosis not present

## 2019-08-05 DIAGNOSIS — M6281 Muscle weakness (generalized): Secondary | ICD-10-CM | POA: Diagnosis not present

## 2019-08-05 DIAGNOSIS — M545 Low back pain: Secondary | ICD-10-CM | POA: Diagnosis not present

## 2019-08-05 DIAGNOSIS — R0602 Shortness of breath: Secondary | ICD-10-CM | POA: Diagnosis not present

## 2019-08-05 DIAGNOSIS — J849 Interstitial pulmonary disease, unspecified: Secondary | ICD-10-CM

## 2019-08-05 DIAGNOSIS — R9389 Abnormal findings on diagnostic imaging of other specified body structures: Secondary | ICD-10-CM | POA: Diagnosis not present

## 2019-08-12 DIAGNOSIS — M6281 Muscle weakness (generalized): Secondary | ICD-10-CM | POA: Diagnosis not present

## 2019-08-12 DIAGNOSIS — G894 Chronic pain syndrome: Secondary | ICD-10-CM | POA: Diagnosis not present

## 2019-08-12 DIAGNOSIS — M545 Low back pain: Secondary | ICD-10-CM | POA: Diagnosis not present

## 2019-08-17 DIAGNOSIS — K219 Gastro-esophageal reflux disease without esophagitis: Secondary | ICD-10-CM | POA: Diagnosis not present

## 2019-08-17 DIAGNOSIS — M8580 Other specified disorders of bone density and structure, unspecified site: Secondary | ICD-10-CM | POA: Diagnosis not present

## 2019-08-17 DIAGNOSIS — J301 Allergic rhinitis due to pollen: Secondary | ICD-10-CM | POA: Diagnosis not present

## 2019-08-17 DIAGNOSIS — I129 Hypertensive chronic kidney disease with stage 1 through stage 4 chronic kidney disease, or unspecified chronic kidney disease: Secondary | ICD-10-CM | POA: Diagnosis not present

## 2019-08-17 DIAGNOSIS — Z0000 Encounter for general adult medical examination without abnormal findings: Secondary | ICD-10-CM | POA: Diagnosis not present

## 2019-08-17 DIAGNOSIS — M810 Age-related osteoporosis without current pathological fracture: Secondary | ICD-10-CM | POA: Diagnosis not present

## 2019-08-17 DIAGNOSIS — E7800 Pure hypercholesterolemia, unspecified: Secondary | ICD-10-CM | POA: Diagnosis not present

## 2019-08-17 DIAGNOSIS — G43009 Migraine without aura, not intractable, without status migrainosus: Secondary | ICD-10-CM | POA: Diagnosis not present

## 2019-08-17 DIAGNOSIS — M2550 Pain in unspecified joint: Secondary | ICD-10-CM | POA: Diagnosis not present

## 2019-08-17 DIAGNOSIS — R69 Illness, unspecified: Secondary | ICD-10-CM | POA: Diagnosis not present

## 2019-08-19 DIAGNOSIS — H401122 Primary open-angle glaucoma, left eye, moderate stage: Secondary | ICD-10-CM | POA: Diagnosis not present

## 2019-08-19 DIAGNOSIS — H401113 Primary open-angle glaucoma, right eye, severe stage: Secondary | ICD-10-CM | POA: Diagnosis not present

## 2019-08-19 DIAGNOSIS — H35371 Puckering of macula, right eye: Secondary | ICD-10-CM | POA: Diagnosis not present

## 2019-08-20 DIAGNOSIS — M545 Low back pain: Secondary | ICD-10-CM | POA: Diagnosis not present

## 2019-08-20 DIAGNOSIS — G894 Chronic pain syndrome: Secondary | ICD-10-CM | POA: Diagnosis not present

## 2019-08-20 DIAGNOSIS — M6281 Muscle weakness (generalized): Secondary | ICD-10-CM | POA: Diagnosis not present

## 2019-08-24 DIAGNOSIS — R69 Illness, unspecified: Secondary | ICD-10-CM | POA: Diagnosis not present

## 2019-08-24 DIAGNOSIS — I129 Hypertensive chronic kidney disease with stage 1 through stage 4 chronic kidney disease, or unspecified chronic kidney disease: Secondary | ICD-10-CM | POA: Diagnosis not present

## 2019-08-24 DIAGNOSIS — E7800 Pure hypercholesterolemia, unspecified: Secondary | ICD-10-CM | POA: Diagnosis not present

## 2019-08-24 DIAGNOSIS — G4700 Insomnia, unspecified: Secondary | ICD-10-CM | POA: Diagnosis not present

## 2019-08-24 DIAGNOSIS — M810 Age-related osteoporosis without current pathological fracture: Secondary | ICD-10-CM | POA: Diagnosis not present

## 2019-08-24 DIAGNOSIS — G43009 Migraine without aura, not intractable, without status migrainosus: Secondary | ICD-10-CM | POA: Diagnosis not present

## 2019-08-24 DIAGNOSIS — N183 Chronic kidney disease, stage 3 unspecified: Secondary | ICD-10-CM | POA: Diagnosis not present

## 2019-08-25 DIAGNOSIS — M545 Low back pain: Secondary | ICD-10-CM | POA: Diagnosis not present

## 2019-08-25 DIAGNOSIS — G894 Chronic pain syndrome: Secondary | ICD-10-CM | POA: Diagnosis not present

## 2019-08-25 DIAGNOSIS — M6281 Muscle weakness (generalized): Secondary | ICD-10-CM | POA: Diagnosis not present

## 2019-08-27 DIAGNOSIS — M6281 Muscle weakness (generalized): Secondary | ICD-10-CM | POA: Diagnosis not present

## 2019-08-27 DIAGNOSIS — G894 Chronic pain syndrome: Secondary | ICD-10-CM | POA: Diagnosis not present

## 2019-08-27 DIAGNOSIS — M545 Low back pain: Secondary | ICD-10-CM | POA: Diagnosis not present

## 2019-09-04 ENCOUNTER — Encounter: Payer: Self-pay | Admitting: Emergency Medicine

## 2019-09-04 ENCOUNTER — Other Ambulatory Visit: Payer: Self-pay

## 2019-09-04 ENCOUNTER — Ambulatory Visit: Payer: Medicare HMO | Admitting: Emergency Medicine

## 2019-09-04 VITALS — BP 150/92 | HR 84 | Ht 60.0 in | Wt 130.0 lb

## 2019-09-04 DIAGNOSIS — J849 Interstitial pulmonary disease, unspecified: Secondary | ICD-10-CM

## 2019-09-04 NOTE — Progress Notes (Signed)
Subjective:    Patient ID: Anna Arnold, female    DOB: July 11, 1950, 69 y.o.   MRN: [id number]  HPI 69 year old never smoker with a history of degenerative disc disease/spondylosis, depression, prior myalgia, GERD, hypertension, seasonal allergies, OSA She is referred today for evaluation of abnormal CT scan of the chest. She was in the ED 2/8 for abdominal pain, CT scan questioned some pulm infiltrates prompting CT chest  She tells me that she has been treated for asthma in the past, did not tolerate and no longer uses. She has had spirometry before (remote). She has a remote positive ANA, saw Dr Amil Amen a few yrs ago. She is an active person, works at a preschool. She is able to walk but sometimes gets SOB if she is going too fast or carrying objects. She has noted this over the past 3 months. She has bee checked for COVID twice in the last 6 months - once for fatigue and myalgias, once after an exposure. She has occasional cough, usually related to seasonal allergies. Flonase prn. She has GERD, has a lot of breakthrough lately on her Protonix.   COVID vaccine UTD   CT chest 08/05/2019 reviewed by me, shows fine peripheral reticular interstitial infiltrates with an apical to basal gradient without significant air trapping, some scattered bronchiolar mucus plugging, tiny occasional pulmonary nodules clustered in a tree-in-bud pattern.  Also a hiatal hernia.   Review of Systems  Past Medical History:  Diagnosis Date  . Anxiety   . Arthritis   . Cataracts, bilateral   . Cervical spondylosis   . Degenerative disc disease, lumbar   . Depression   . Fibromyalgia   . GERD (gastroesophageal reflux disease)   . Glaucoma   . Hypertension   . Migraines   . Osteopenia   . Seasonal allergies   . Sleep disorder      Family History  Problem Relation Age of Onset  . Cancer Mother   . Depression Mother   . Heart disease Father   . Heart attack Brother   . Depression Brother   . Colon  cancer Neg Hx   . Colon polyps Neg Hx   . Esophageal cancer Neg Hx   . Rectal cancer Neg Hx   . Stomach cancer Neg Hx    No family hx of ILD. Mom had Lung CA   Social History   Socioeconomic History  . Marital status: Married    Spouse name: Not on file  . Number of children: Not on file  . Years of education: Not on file  . Highest education level: Not on file  Occupational History  . Not on file  Tobacco Use  . Smoking status: Never Smoker  . Smokeless tobacco: Never Used  Substance and Sexual Activity  . Alcohol use: No  . Drug use: No  . Sexual activity: Not on file  Other Topics Concern  . Not on file  Social History Narrative  . Not on file   Social Determinants of Health   Financial Resource Strain:   . Difficulty of Paying Living Expenses:   Food Insecurity:   . Worried About Charity fundraiser in the Last Year:   . Arboriculturist in the Last Year:   Transportation Needs:   . Film/video editor (Medical):   [name] Lack of Transportation (Non-Medical):   Physical Activity:   . Days of Exercise per Week:   . Minutes of Exercise per Session:  Stress:   . Feeling of Stress :   Social Connections:   . Frequency of Communication with Friends and Family:   . Frequency of Social Gatherings with Friends and Family:   . Attends Religious Services:   . Active Member of Clubs or Organizations:   . Attends Archivist Meetings:   [name] Marital Status:   Intimate Partner Violence:   . Fear of Current or Ex-Partner:   . Emotionally Abused:   [name] Physically Abused:   . Sexually Abused:   Has lived on a farm as a child, exposed to some pesticides Lived in [place], [place], [place], [place] Has a crawlspace, ? 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 when the last one was.   Allergies  Allergen Reactions  . Amitriptyline     sedation  . Atenolol     fatigue   . Augmentin [Amoxicillin-Pot Clavulanate]     GI pain  . Biaxin [Clarithromycin]     GI pain  . Ceftin [Cefuroxime Axetil]     Diarrhea and vomiting  . Chlorthalidone     cramping  . Citalopram Hydrobromide Other (See Comments)    fatigue  . Erythromycin   . Fluoxetine Other (See Comments)    agitation  . Lexapro [Escitalopram Oxalate]     Agitation and nausea  . Meloxicam     Nausea   . Potassium Chloride     Abdominal pain  . Pravastatin     Joint and muscle pain  . Reglan [Metoclopramide]     Abdominal pain  . Sertraline Hcl Other (See Comments)    fatigue  . Simvastatin     Joint and muscle pain  . Singulair [Montelukast Sodium]     Severe agitation, pruritis   . Viibryd [Vilazodone Hcl]     agitation  . Zegerid [Omeprazole-Sodium Bicarbonate]   . Zegerid [Omeprazole]     nausea     Outpatient Medications Prior to Visit  Medication Sig Dispense Refill  . Acetaminophen (TYLENOL ARTHRITIS PAIN PO) Take 650 mg by mouth 2 (two) times daily as needed (for arthritis pain).    [name] atorvastatin (LIPITOR) 10 MG tablet Take 10 mg by mouth 2 (two) times a week.    . bimatoprost (LUMIGAN) 0.01 % SOLN Place 1 drop into both eyes at bedtime.    [name] buPROPion (WELLBUTRIN SR) 100 MG 12 hr tablet Take 100 mg by mouth daily.    . Camphor-Menthol-Methyl Sal (SALONPAS) 3.06-02-08 % PTCH Apply 1 patch topically at bedtime as needed.    . Cholecalciferol (VITAMIN D3) 1000 units CAPS Take by mouth.    . fluticasone (FLONASE) 50 MCG/ACT nasal spray Place 1 spray into both nostrils 2 (two) times daily as needed for allergies or rhinitis.    [name] losartan (COZAAR) 50 MG tablet Take 50 mg by mouth daily.    . Multiple Minerals-Vitamins (CALCIUM-MAGNESIUM-ZINC-D3 PO) Take by mouth.    . Omega-3 Fatty Acids (FISH OIL ULTRA) 1400 MG CAPS Take by mouth.    . pantoprazole (PROTONIX) 40 MG tablet Take 40 mg by mouth daily.    Vladimir Faster Glycol-Propyl Glycol (SYSTANE PRESERVATIVE FREE OP) Apply to eye  as needed.    . sucralfate (CARAFATE) 1 g tablet Take 1 tablet (1 g total) by mouth 4 (four) times daily -  with meals and at bedtime. 90 tablet 0  . SUMAtriptan (IMITREX)  100 MG tablet Take 100 mg by mouth every 2 (two) hours as needed for migraine. Take 1/2 tablet for migraine. May repeat in 2 hours if headache persists or recurs.     Facility-Administered Medications Prior to Visit  Medication Dose Route Frequency Provider Last Rate Last Admin  . 0.9 %  sodium chloride infusion  500 mL Intravenous Continuous Nandigam, Venia Minks, MD            Objective:   Physical Exam Vitals:   09/04/19 1016  BP: (!) 150/92  Pulse: 84  SpO2: 99%  Weight: 130 lb (59 kg)  Height: 5' (1.524 m)   Gen: Pleasant, well-nourished, in no distress,  normal affect  ENT: No lesions,  mouth clear,  oropharynx clear, no postnasal drip  Neck: No JVD, no stridor  Lungs: No use of accessory muscles, no crackles or wheezing on normal respiration, no wheeze on forced expiration  Cardiovascular: RRR, heart sounds normal, no murmur or gallops, no peripheral edema  Musculoskeletal: deformity of PIP joints #2 and 3 on R, #2 on L, no cyanosis or clubbing  Neuro: alert, awake, non focal  Skin: Warm, no lesions or rash      Assessment & Plan:  ILD (interstitial lung disease) (HCC) Very subtle but discernible basilar interstitial changes, possible areas of micronodular disease as well.  Differential includes autoimmune ILD.  Consider mycobacterial colonization.  She has seen rheumatology, apparently has a history of a positive ANA but no unifying diagnosis.  I will send an autoimmune panel today and perform full pulmonary function testing.  She will bring her rheumatology notes with her next visit.  Depending on PFT, imaging, serologies we will decide whether to refer her to the ILD clinic versus pursue bronchoscopy, BAL.  Baltazar Apo, MD, PhD 09/04/2019, 5:36 PM [facility] [phone]  or if no answer [phone]

## 2019-09-04 NOTE — Patient Instructions (Signed)
We will perform blood work today We will arrange for pulmonary function testing  Please bring your records from Dr Melissa Noon office next visit.  Follow with Dr. Lamonte Sakai next available with full pulmonary function testing on the same day.

## 2019-09-04 NOTE — Assessment & Plan Note (Signed)
Very subtle but discernible basilar interstitial changes, possible areas of micronodular disease as well.  Differential includes autoimmune ILD.  Consider mycobacterial colonization.  She has seen rheumatology, apparently has a history of a positive ANA but no unifying diagnosis.  I will send an autoimmune panel today and perform full pulmonary function testing.  She will bring her rheumatology notes with her next visit.  Depending on PFT, imaging, serologies we will decide whether to refer her to the ILD clinic versus pursue bronchoscopy, BAL.

## 2019-09-05 LAB — ANTI-JO 1 ANTIBODY, IGG: Anti JO-1: <0.2 AI (ref 0.0–0.9)

## 2019-09-07 LAB — ANTI-SMITH ANTIBODY: ENA SM Ab Ser-aCnc: <1 AI

## 2019-09-08 LAB — ANTI-NUCLEAR AB-TITER (ANA TITER)
ANA TITER: 1:40 {titer} — ABNORMAL HIGH
ANA Titer 1: 1:40 {titer} — ABNORMAL HIGH

## 2019-09-08 LAB — ALDOLASE: Aldolase: 3.6 U/L (ref <=8.1)

## 2019-09-08 LAB — RHEUMATOID FACTOR: Rheumatoid fact SerPl-aCnc: <14 IU/mL (ref <14)

## 2019-09-08 LAB — CYCLIC CITRUL PEPTIDE ANTIBODY, IGG: Cyclic Citrullin Peptide Ab: <16 UNITS

## 2019-09-08 LAB — ANTI-SCLERODERMA ANTIBODY: Scleroderma (Scl-70) (ENA) Antibody, IgG: <1 AI

## 2019-09-08 LAB — ANA: Anti Nuclear Antibody (ANA): POSITIVE — AB

## 2019-09-08 LAB — SJOGREN'S SYNDROME ANTIBODS(SSA + SSB)
SSA (Ro) (ENA) Antibody, IgG: <1 AI
SSB (La) (ENA) Antibody, IgG: <1 AI

## 2019-09-08 LAB — RNP ANTIBODY: Ribonucleic Protein(ENA) Antibody, IgG: <1 AI

## 2019-09-08 LAB — ANTI-DNA ANTIBODY, DOUBLE-STRANDED: ds DNA Ab: <1 IU/mL

## 2019-09-09 DIAGNOSIS — G894 Chronic pain syndrome: Secondary | ICD-10-CM | POA: Diagnosis not present

## 2019-09-09 DIAGNOSIS — M6281 Muscle weakness (generalized): Secondary | ICD-10-CM | POA: Diagnosis not present

## 2019-09-09 DIAGNOSIS — M545 Low back pain: Secondary | ICD-10-CM | POA: Diagnosis not present

## 2019-09-10 ENCOUNTER — Telehealth: Payer: Self-pay | Admitting: Emergency Medicine

## 2019-09-10 NOTE — Telephone Encounter (Signed)
ROI Faxed to [facility] 09/10/19 fbg

## 2019-09-16 DIAGNOSIS — G894 Chronic pain syndrome: Secondary | ICD-10-CM | POA: Diagnosis not present

## 2019-09-16 DIAGNOSIS — M6281 Muscle weakness (generalized): Secondary | ICD-10-CM | POA: Diagnosis not present

## 2019-09-16 DIAGNOSIS — M545 Low back pain: Secondary | ICD-10-CM | POA: Diagnosis not present

## 2019-09-18 DIAGNOSIS — M545 Low back pain: Secondary | ICD-10-CM | POA: Diagnosis not present

## 2019-09-18 DIAGNOSIS — M6281 Muscle weakness (generalized): Secondary | ICD-10-CM | POA: Diagnosis not present

## 2019-09-18 DIAGNOSIS — G894 Chronic pain syndrome: Secondary | ICD-10-CM | POA: Diagnosis not present

## 2019-09-22 ENCOUNTER — Other Ambulatory Visit (HOSPITAL_COMMUNITY)
Admission: RE | Admit: 2019-09-22 | Discharge: 2019-09-22 | Disposition: A | Discharge status: Home / Self Care | Payer: Medicare HMO | Source: Ambulatory Visit | Attending: Emergency Medicine | Admitting: Emergency Medicine

## 2019-09-22 ENCOUNTER — Other Ambulatory Visit (HOSPITAL_COMMUNITY): Payer: Medicare HMO

## 2019-09-22 ENCOUNTER — Telehealth: Payer: Self-pay | Admitting: Emergency Medicine

## 2019-09-22 DIAGNOSIS — Z01812 Encounter for preprocedural laboratory examination: Secondary | ICD-10-CM | POA: Diagnosis not present

## 2019-09-22 DIAGNOSIS — Z20822 Contact with and (suspected) exposure to covid-19: Secondary | ICD-10-CM | POA: Insufficient documentation

## 2019-09-22 LAB — SARS CORONAVIRUS 2 (TAT 6-24 HRS): SARS Coronavirus 2: NEGATIVE

## 2019-09-22 NOTE — Telephone Encounter (Signed)
Pt asked that we request records from [facility] on [address] since she is not having luck getting them to send records to [place]

## 2019-09-22 NOTE — Telephone Encounter (Signed)
Records received and placed in Dr Agustina Caroli lookat folder in [facility]  Pt aware  Nothing further needed

## 2019-09-22 NOTE — Telephone Encounter (Signed)
LMTCB x1 for pt letting her know that I have not received any records.[name]

## 2019-09-22 NOTE — Telephone Encounter (Signed)
Pt called back about this-- I let her know content of previous documentation.

## 2019-09-22 NOTE — Telephone Encounter (Signed)
Pt also requested we let her know when we receive her records-- she is very stressed out about this

## 2019-09-25 ENCOUNTER — Ambulatory Visit (INDEPENDENT_AMBULATORY_CARE_PROVIDER_SITE_OTHER): Payer: Medicare HMO | Admitting: Emergency Medicine

## 2019-09-25 ENCOUNTER — Other Ambulatory Visit: Payer: Self-pay

## 2019-09-25 ENCOUNTER — Encounter: Payer: Self-pay | Admitting: Emergency Medicine

## 2019-09-25 ENCOUNTER — Ambulatory Visit: Payer: Medicare HMO | Admitting: Emergency Medicine

## 2019-09-25 DIAGNOSIS — J849 Interstitial pulmonary disease, unspecified: Secondary | ICD-10-CM

## 2019-09-25 DIAGNOSIS — J4520 Mild intermittent asthma, uncomplicated: Secondary | ICD-10-CM | POA: Diagnosis not present

## 2019-09-25 DIAGNOSIS — J45909 Unspecified asthma, uncomplicated: Secondary | ICD-10-CM | POA: Insufficient documentation

## 2019-09-25 LAB — PULMONARY FUNCTION TEST
DL/VA % pred: 91 %
DL/VA: 3.91 ml/min/mmHg/L
DLCO cor % pred: 101 %
DLCO cor: 17.19 ml/min/mmHg
DLCO unc % pred: 101 %
DLCO unc: 17.19 ml/min/mmHg
FEF 25-75 Post: 1.53 L/sec
FEF 25-75 Pre: 1.1 L/sec
FEF2575-%Change-Post: 38 %
FEF2575-%Pred-Post: 88 %
FEF2575-%Pred-Pre: 64 %
FEV1-%Change-Post: 7 %
FEV1-%Pred-Post: 112 %
FEV1-%Pred-Pre: 104 %
FEV1-Post: 2.18 L
FEV1-Pre: 2.03 L
FEV1FVC-%Change-Post: 8 %
FEV1FVC-%Pred-Pre: 89 %
FEV6-%Change-Post: 0 %
FEV6-%Pred-Post: 119 %
FEV6-%Pred-Pre: 119 %
FEV6-Post: 2.91 L
FEV6-Pre: 2.92 L
FEV6FVC-%Change-Post: 1 %
FEV6FVC-%Pred-Post: 103 %
FEV6FVC-%Pred-Pre: 102 %
FVC-%Change-Post: 0 %
FVC-%Pred-Post: 115 %
FVC-%Pred-Pre: 116 %
FVC-Post: 2.96 L
FVC-Pre: 2.99 L
Post FEV1/FVC ratio: 74 %
Post FEV6/FVC ratio: 99 %
Pre FEV1/FVC ratio: 68 %
Pre FEV6/FVC Ratio: 98 %
RV % pred: 116 %
RV: 2.29 L
TLC % pred: 116 %
TLC: 5.18 L

## 2019-09-25 MED ORDER — ALBUTEROL SULFATE HFA 108 (90 BASE) MCG/ACT IN AERS
2.0000 | INHALATION_SPRAY | Freq: Four times a day (QID) | RESPIRATORY_TRACT | 5 refills | Status: DC | PRN
Start: 2019-09-25 — End: 2021-01-19

## 2019-09-25 NOTE — Assessment & Plan Note (Signed)
Mild obstruction noted on her pulmonary function testing without a bronchodilator response.  Question whether she has had some low-level asthma, obstructive lung disease now with some fixed obstruction.  She is not significantly bothered by this.  I do not think she needs a schedule bronchodilator or ICS.  She does need albuterol to have available to use as needed.

## 2019-09-25 NOTE — Assessment & Plan Note (Signed)
Very subtle interstitial prominence on CT chest.  Significance unclear.  I do not see evidence for obvious bronchiectasis, mycobacterial disease.  Her autoimmune work-up is negative, consistent with her previous work-up with Dr. Amil Amen.  I do not think there is a role for surgical biopsy or bronchoscopy at this time although if she has a clinical change we may decide to repeat her CT scan earlier and consider diagnostic procedures.  For now I would like to repeat her high-resolution CT scan in March 2022.  If ILD persist, progressive then I will probably send her to our ILD clinic.   Your autoimmune lab work was negative for any underlying cause that would contribute to lung inflammation. We will plan to repeat your high-resolution CT scan of the chest in March 2022. Follow with Dr Lamonte Sakai in March 2022 after your CT scan is completed Please let us know if your breathing changes in any way prior to next visit.  If so we may decide to get your CT scan and follow up sooner.

## 2019-09-25 NOTE — Progress Notes (Signed)
Subjective:    Patient ID: Anna Arnold, female    DOB: [birth year], 69 y.o.   MRN: [id number]  HPI 69 year old never smoker with a history of degenerative disc disease/spondylosis, depression, prior myalgia, GERD, hypertension, seasonal allergies, OSA She is referred today for evaluation of abnormal CT scan of the chest. She was in the ED 2/8 for abdominal pain, CT scan questioned some pulm infiltrates prompting CT chest  She tells me that she has been treated for asthma in the past, did not tolerate and no longer uses. She has had spirometry before (remote). She has a remote positive ANA, saw Dr Amil Amen a few yrs ago. She is an active person, works at a preschool. She is able to walk but sometimes gets SOB if she is going too fast or carrying objects. She has noted this over the past 3 months. She has bee checked for COVID twice in the last 6 months - once for fatigue and myalgias, once after an exposure. She has occasional cough, usually related to seasonal allergies. Flonase prn. She has GERD, has a lot of breakthrough lately on her Protonix.   COVID vaccine UTD   CT chest 08/05/2019 reviewed by me, shows fine peripheral reticular interstitial infiltrates with an apical to basal gradient without significant air trapping, some scattered bronchiolar mucus plugging, tiny occasional pulmonary nodules clustered in a tree-in-bud pattern.  Also a hiatal hernia. She rarely has dyspnea, recently has noticed it with fast walking or heavier exertion.   ROV 09/25/19 --this a follow-up visit for 69 year old woman with history of DJD and spondylosis, possible autoimmune disease with a history of remote positive ANA.  She was seen earlier this month for an abnormal CT scan of the chest that shows some subtle peripheral reticular interstitial changes, scattered bronchiolar mucus plugging and some tree-in-bud clusters.  Autoimmune panel from 09/04/2019 reviewed: SSA, SSB negative, Scl-70, RNP, anti-Smith, CCP,  RF, anti-dsDNA, anti-Jo, aldolase are all negative ANA screen was positive with nuclear speckled pattern but titer was 1: 40 (effectively negative)  PFT done today reviewed by me shows mild obstruction with without a bronchodilator response, normal lung volumes, normal diffusion capacity.  Reviewed her rheumatology notes from Dr Amil Amen  Review of Systems As per HPI     Objective:   Physical Exam Vitals:   09/25/19 1404  BP: 136/82  Pulse: 85  Temp: 98 F (36.7 C)  TempSrc: Temporal  SpO2: 100%  Weight: 128 lb 12.8 oz (58.4 kg)  Height: 5' (1.524 m)   Gen: Pleasant, well-nourished, in no distress,  normal affect  ENT: No lesions,  mouth clear,  oropharynx clear, no postnasal drip  Neck: No JVD, no stridor  Lungs: No use of accessory muscles, no crackles or wheezing on normal respiration, no wheeze on forced expiration  Cardiovascular: RRR, heart sounds normal, no murmur or gallops, no peripheral edema  Musculoskeletal: deformity of PIP joints #2 and 3 on R, #2 on L, no cyanosis or clubbing  Neuro: alert, awake, non focal  Skin: Warm, no lesions or rash      Assessment & Plan:  ILD (interstitial lung disease) (HCC) Very subtle interstitial prominence on CT chest.  Significance unclear.  I do not see evidence for obvious bronchiectasis, mycobacterial disease.  Her autoimmune work-up is negative, consistent with her previous work-up with Dr. Amil Amen.  I do not think there is a role for surgical biopsy or bronchoscopy at this time although if she has a clinical change we may decide  to repeat her CT scan earlier and consider diagnostic procedures.  For now I would like to repeat her high-resolution CT scan in March 2022.  If ILD persist, progressive then I will probably send her to our ILD clinic.   Your autoimmune lab work was negative for any underlying cause that would contribute to lung inflammation. We will plan to repeat your high-resolution CT scan of the chest in  March 2022. Follow with Dr Lamonte Sakai in March 2022 after your CT scan is completed Please let us know if your breathing changes in any way prior to next visit.  If so we may decide to get your CT scan and follow up sooner.  Asthma Mild obstruction noted on her pulmonary function testing without a bronchodilator response.  Question whether she has had some low-level asthma, obstructive lung disease now with some fixed obstruction.  She is not significantly bothered by this.  I do not think she needs a schedule bronchodilator or ICS.  She does need albuterol to have available to use as needed.  Baltazar Apo, MD, PhD 09/25/2019, 2:36 PM  [facility] [phone] or if no answer [phone]

## 2019-09-25 NOTE — Addendum Note (Signed)
Addended by: Vanessa Barbara on: 09/25/2019 02:41 PM   Modules accepted: Orders

## 2019-09-25 NOTE — Progress Notes (Signed)
PFT done today. 

## 2019-09-25 NOTE — Patient Instructions (Signed)
Your pulmonary function testing shows some evidence for very mild asthma. We will give you an albuterol inhaler to have available to use 2 puffs if you need it for shortness of breath, chest tightness or wheezing. Your autoimmune lab work was negative for any underlying cause that would contribute to lung inflammation. We will plan to repeat your high-resolution CT scan of the chest in March 2022. Follow with Dr Lamonte Sakai in March 2022 after your CT scan is completed Please let us know if your breathing changes in any way prior to next visit.  If so we may decide to get your CT scan and follow up sooner.

## 2019-09-29 DIAGNOSIS — R69 Illness, unspecified: Secondary | ICD-10-CM | POA: Diagnosis not present

## 2019-09-30 DIAGNOSIS — M6281 Muscle weakness (generalized): Secondary | ICD-10-CM | POA: Diagnosis not present

## 2019-09-30 DIAGNOSIS — G894 Chronic pain syndrome: Secondary | ICD-10-CM | POA: Diagnosis not present

## 2019-09-30 DIAGNOSIS — M545 Low back pain: Secondary | ICD-10-CM | POA: Diagnosis not present

## 2019-10-02 DIAGNOSIS — I10 Essential (primary) hypertension: Secondary | ICD-10-CM | POA: Diagnosis not present

## 2019-10-02 DIAGNOSIS — H409 Unspecified glaucoma: Secondary | ICD-10-CM | POA: Diagnosis not present

## 2019-10-02 DIAGNOSIS — M1990 Unspecified osteoarthritis, unspecified site: Secondary | ICD-10-CM | POA: Diagnosis not present

## 2019-10-02 DIAGNOSIS — E785 Hyperlipidemia, unspecified: Secondary | ICD-10-CM | POA: Diagnosis not present

## 2019-10-02 DIAGNOSIS — K219 Gastro-esophageal reflux disease without esophagitis: Secondary | ICD-10-CM | POA: Diagnosis not present

## 2019-10-02 DIAGNOSIS — Z008 Encounter for other general examination: Secondary | ICD-10-CM | POA: Diagnosis not present

## 2019-10-02 DIAGNOSIS — J45909 Unspecified asthma, uncomplicated: Secondary | ICD-10-CM | POA: Diagnosis not present

## 2019-10-02 DIAGNOSIS — F419 Anxiety disorder, unspecified: Secondary | ICD-10-CM | POA: Diagnosis not present

## 2019-10-02 DIAGNOSIS — R69 Illness, unspecified: Secondary | ICD-10-CM | POA: Diagnosis not present

## 2019-10-02 DIAGNOSIS — G8929 Other chronic pain: Secondary | ICD-10-CM | POA: Diagnosis not present

## 2019-10-02 DIAGNOSIS — G43909 Migraine, unspecified, not intractable, without status migrainosus: Secondary | ICD-10-CM | POA: Diagnosis not present

## 2019-10-06 DIAGNOSIS — R69 Illness, unspecified: Secondary | ICD-10-CM | POA: Diagnosis not present

## 2019-10-13 ENCOUNTER — Telehealth: Payer: Self-pay | Admitting: Emergency Medicine

## 2019-10-13 NOTE — Telephone Encounter (Signed)
Pt needs to call [facility] billing in regards to her billing questions. Phone number pt needs to call is [phone]. Called and spoke with pt letting her know that she needed to contact [facility] billing to get the bill straightened out and have any questions answered. Pt verbalized understanding. Pt was given the phone number for [facility] billing. Nothing further needed.

## 2019-10-14 DIAGNOSIS — M545 Low back pain: Secondary | ICD-10-CM | POA: Diagnosis not present

## 2019-10-14 DIAGNOSIS — G894 Chronic pain syndrome: Secondary | ICD-10-CM | POA: Diagnosis not present

## 2019-10-14 DIAGNOSIS — M6281 Muscle weakness (generalized): Secondary | ICD-10-CM | POA: Diagnosis not present

## 2019-10-16 ENCOUNTER — Other Ambulatory Visit: Payer: Self-pay | Admitting: Family Medicine

## 2019-10-16 DIAGNOSIS — Z1231 Encounter for screening mammogram for malignant neoplasm of breast: Secondary | ICD-10-CM

## 2019-10-20 ENCOUNTER — Telehealth: Payer: Self-pay | Admitting: Emergency Medicine

## 2019-10-20 NOTE — Telephone Encounter (Signed)
Called and spoke with pt further about the bill she received from [facility]. She stated when she called [facility] billing, they told her to contact our office. Stated to her that some of the lab tests were sent to [facility] and some were sent to [facility]. Pt verbalized understanding and stated she was going to go ahead and pay the bill. Nothing further needed.

## 2019-10-20 NOTE — Telephone Encounter (Signed)
Patient phone number is [phone] h or [phone] c.

## 2019-11-06 DIAGNOSIS — E7800 Pure hypercholesterolemia, unspecified: Secondary | ICD-10-CM | POA: Diagnosis not present

## 2019-11-06 DIAGNOSIS — M810 Age-related osteoporosis without current pathological fracture: Secondary | ICD-10-CM | POA: Diagnosis not present

## 2019-11-06 DIAGNOSIS — I700 Atherosclerosis of aorta: Secondary | ICD-10-CM | POA: Diagnosis not present

## 2019-11-06 DIAGNOSIS — Z130 Encounter for screening for diseases of the blood and blood-forming organs and certain disorders involving the immune mechanism: Secondary | ICD-10-CM | POA: Diagnosis not present

## 2019-11-06 DIAGNOSIS — Z5181 Encounter for therapeutic drug level monitoring: Secondary | ICD-10-CM | POA: Diagnosis not present

## 2019-11-06 DIAGNOSIS — I10 Essential (primary) hypertension: Secondary | ICD-10-CM | POA: Diagnosis not present

## 2019-11-10 DIAGNOSIS — R002 Palpitations: Secondary | ICD-10-CM | POA: Diagnosis not present

## 2019-11-10 DIAGNOSIS — Z5181 Encounter for therapeutic drug level monitoring: Secondary | ICD-10-CM | POA: Diagnosis not present

## 2019-11-10 DIAGNOSIS — Z7689 Persons encountering health services in other specified circumstances: Secondary | ICD-10-CM | POA: Diagnosis not present

## 2019-11-10 DIAGNOSIS — H401134 Primary open-angle glaucoma, bilateral, indeterminate stage: Secondary | ICD-10-CM | POA: Diagnosis not present

## 2019-11-10 DIAGNOSIS — E7800 Pure hypercholesterolemia, unspecified: Secondary | ICD-10-CM | POA: Diagnosis not present

## 2019-11-10 DIAGNOSIS — M25551 Pain in right hip: Secondary | ICD-10-CM | POA: Diagnosis not present

## 2019-11-10 DIAGNOSIS — G8929 Other chronic pain: Secondary | ICD-10-CM | POA: Diagnosis not present

## 2019-11-10 DIAGNOSIS — M545 Low back pain: Secondary | ICD-10-CM | POA: Diagnosis not present

## 2019-11-10 DIAGNOSIS — I700 Atherosclerosis of aorta: Secondary | ICD-10-CM | POA: Diagnosis not present

## 2019-11-18 DIAGNOSIS — H35371 Puckering of macula, right eye: Secondary | ICD-10-CM | POA: Diagnosis not present

## 2019-11-18 DIAGNOSIS — H401113 Primary open-angle glaucoma, right eye, severe stage: Secondary | ICD-10-CM | POA: Diagnosis not present

## 2019-11-18 DIAGNOSIS — H401122 Primary open-angle glaucoma, left eye, moderate stage: Secondary | ICD-10-CM | POA: Diagnosis not present

## 2019-11-19 DIAGNOSIS — N183 Chronic kidney disease, stage 3 unspecified: Secondary | ICD-10-CM | POA: Diagnosis not present

## 2019-11-19 DIAGNOSIS — E7800 Pure hypercholesterolemia, unspecified: Secondary | ICD-10-CM | POA: Diagnosis not present

## 2019-11-19 DIAGNOSIS — G4700 Insomnia, unspecified: Secondary | ICD-10-CM | POA: Diagnosis not present

## 2019-11-19 DIAGNOSIS — I10 Essential (primary) hypertension: Secondary | ICD-10-CM | POA: Diagnosis not present

## 2019-11-19 DIAGNOSIS — M810 Age-related osteoporosis without current pathological fracture: Secondary | ICD-10-CM | POA: Diagnosis not present

## 2019-11-19 DIAGNOSIS — G43009 Migraine without aura, not intractable, without status migrainosus: Secondary | ICD-10-CM | POA: Diagnosis not present

## 2019-11-19 DIAGNOSIS — I129 Hypertensive chronic kidney disease with stage 1 through stage 4 chronic kidney disease, or unspecified chronic kidney disease: Secondary | ICD-10-CM | POA: Diagnosis not present

## 2019-11-19 DIAGNOSIS — R69 Illness, unspecified: Secondary | ICD-10-CM | POA: Diagnosis not present

## 2019-11-23 ENCOUNTER — Other Ambulatory Visit: Payer: Self-pay

## 2019-11-23 ENCOUNTER — Ambulatory Visit
Admission: RE | Admit: 2019-11-23 | Discharge: 2019-11-23 | Disposition: A | Discharge status: Home / Self Care | Payer: Medicare HMO | Source: Ambulatory Visit | Attending: Family Medicine | Admitting: Family Medicine

## 2019-11-23 DIAGNOSIS — Z1231 Encounter for screening mammogram for malignant neoplasm of breast: Secondary | ICD-10-CM | POA: Diagnosis not present

## 2019-12-09 ENCOUNTER — Telehealth: Payer: Self-pay | Admitting: Radiology

## 2019-12-09 ENCOUNTER — Other Ambulatory Visit: Payer: Self-pay

## 2019-12-09 ENCOUNTER — Encounter: Payer: Self-pay | Admitting: Cardiology

## 2019-12-09 ENCOUNTER — Ambulatory Visit: Payer: Medicare HMO | Admitting: Cardiology

## 2019-12-09 VITALS — BP 140/82 | HR 94 | Temp 97.2°F | Ht 60.0 in | Wt 134.0 lb

## 2019-12-09 DIAGNOSIS — E785 Hyperlipidemia, unspecified: Secondary | ICD-10-CM | POA: Diagnosis not present

## 2019-12-09 DIAGNOSIS — I10 Essential (primary) hypertension: Secondary | ICD-10-CM

## 2019-12-09 DIAGNOSIS — R079 Chest pain, unspecified: Secondary | ICD-10-CM | POA: Diagnosis not present

## 2019-12-09 DIAGNOSIS — R002 Palpitations: Secondary | ICD-10-CM

## 2019-12-09 NOTE — Progress Notes (Signed)
Cardiology Office Note:    Date:  12/11/2019   ID:  Anna Arnold, DOB 11-11-1950, MRN [id number]  PCP:  Anna Shanks, MD  Cardiologist:  No primary care provider on file.  Electrophysiologist:  None   Referring MD: Anna Shanks, MD   Chief Complaint  Patient presents with  . Palpitations  . Fatigue  . Heart Murmur  . Foot Swelling    left foot and leg     History of Present Illness:    Anna Arnold is a 69 y.o. female with a hx of fibromyalgia, hyperlipidemia who is referred by Dr. Jacelyn Arnold for evaluation of palpitations.  She reports that her palpitations started 6 weeks ago.  Occurs 2-3 times per week, feels like her heart is racing.  Will last for 2 minutes or so and resolve.  Always occurs at night, especially when she is lying on her left side.  She denies any lightheadedness or syncope during episodes, but does report she has lightheadedness sometimes during the day.  Also reports she has been having some swelling in her left ankle.  In addition, she states that she had some chest pain last week.  Describes as left-sided dull aching pain.  Lasted for 1 to 1.5 hours.  For exercise she goes for walks.  Typically does about 10,000 steps per day between walking and her job working at a preschool.  Reports she has noted progressive fatigue and shortness of breath with exertion.  No smoking history.  Family history includes father died of MI at age 62, though she thinks he had a stent at some point prior to this.  Her oldest brother died of MI at age 61.  She is taking atorvastatin 10 mg twice weekly, has been unable to tolerate more frequent dosing.  She brought her home BP log with her, BP has been 100s to 130s over 60s to 80s.   Past Medical History:  Diagnosis Date  . Anxiety   . Arthritis   . Cataracts, bilateral   . Cervical spondylosis   . Degenerative disc disease, lumbar   . Depression   . Fibromyalgia   . GERD (gastroesophageal reflux disease)   . Glaucoma   .  Hypertension   . Migraines   . Osteopenia   . Seasonal allergies   . Sleep disorder     Past Surgical History:  Procedure Laterality Date  . bunionectomy Right 2008  . CATARACT EXTRACTION Bilateral 2015-2016  . [place]  . GLAUCOMA SURGERY Bilateral 2013  . HAMMER TOE SURGERY Right 2008  . ROTATOR CUFF REPAIR Right 2012    Current Medications: Current Meds  Medication Sig  . Acetaminophen (TYLENOL ARTHRITIS PAIN PO) Take 650 mg by mouth 2 (two) times daily as needed (for arthritis pain).  [name] albuterol (VENTOLIN HFA) 108 (90 Base) MCG/ACT inhaler Inhale 2 puffs into the lungs every 6 (six) hours as needed for wheezing or shortness of breath.  [name] alendronate (FOSAMAX) 70 MG tablet Take 70 mg by mouth once a week. Take with a full glass of water on an empty stomach.  [name] atorvastatin (LIPITOR) 10 MG tablet Take 10 mg by mouth 2 (two) times a week.  . bimatoprost (LUMIGAN) 0.01 % SOLN Place 1 drop into both eyes at bedtime.  . Brimonidine Tartrate 0.025 % SOLN Apply 1 drop to eye in the morning, at noon, and at bedtime.  [name] buPROPion (WELLBUTRIN SR) 100 MG 12 hr tablet Take 100 mg  by mouth daily.  . Camphor-Menthol-Methyl Sal (SALONPAS) 3.06-02-08 % PTCH Apply 1 patch topically at bedtime as needed.  . Cholecalciferol (VITAMIN D3) 1000 units CAPS Take by mouth.  . fluticasone (FLONASE) 50 MCG/ACT nasal spray Place 1 spray into both nostrils 2 (two) times daily as needed for allergies or rhinitis.  [name] losartan (COZAAR) 50 MG tablet Take 50 mg by mouth daily.  . Multiple Minerals-Vitamins (CALCIUM-MAGNESIUM-ZINC-D3 PO) Take by mouth.  . Omega-3 Fatty Acids (FISH OIL ULTRA) 1400 MG CAPS Take by mouth.  . pantoprazole (PROTONIX) 40 MG tablet Take 40 mg by mouth daily.  Vladimir Faster Glycol-Propyl Glycol (SYSTANE PRESERVATIVE FREE OP) Apply to eye as needed.  . SUMAtriptan (IMITREX) 100 MG tablet Take 100 mg by mouth every 2 (two) hours as needed for migraine. Take 1/2 tablet for  migraine. May repeat in 2 hours if headache persists or recurs.   Current Facility-Administered Medications for the 12/09/19 encounter (Office Visit) with Donato Heinz, MD  Medication  . 0.9 %  sodium chloride infusion     Allergies:   Amitriptyline, Atenolol, Augmentin [amoxicillin-pot clavulanate], Biaxin [clarithromycin], Ceftin [cefuroxime axetil], Chlorthalidone, Citalopram hydrobromide, Erythromycin, Fluoxetine, Lexapro [escitalopram oxalate], Meloxicam, Potassium chloride, Pravastatin, Reglan [metoclopramide], Sertraline hcl, Simvastatin, Singulair [montelukast sodium], Travoprost, Viibryd [vilazodone hcl], Zegerid [omeprazole-sodium bicarbonate], Zegerid [omeprazole], and Brinzolamide   Social History   Socioeconomic History  . Marital status: Married    Spouse name: Not on file  . Number of children: Not on file  . Years of education: Not on file  . Highest education level: Not on file  Occupational History  . Not on file  Tobacco Use  . Smoking status: Never Smoker  . Smokeless tobacco: Never Used  Substance and Sexual Activity  . Alcohol use: No  . Drug use: No  . Sexual activity: Not on file  Other Topics Concern  . Not on file  Social History Narrative  . Not on file   Social Determinants of Health   Financial Resource Strain:   . Difficulty of Paying Living Expenses:   Food Insecurity:   . Worried About Charity fundraiser in the Last Year:   . Arboriculturist in the Last Year:   Transportation Needs:   . Film/video editor (Medical):   [name] Lack of Transportation (Non-Medical):   Physical Activity:   . Days of Exercise per Week:   . Minutes of Exercise per Session:   Stress:   . Feeling of Stress :   Social Connections:   . Frequency of Communication with Friends and Family:   . Frequency of Social Gatherings with Friends and Family:   . Attends Religious Services:   . Active Member of Clubs or Organizations:   . Attends Theatre manager Meetings:   [name] Marital Status:      Family History: The patient'sfamily history includes Cancer in her mother; Depression in her brother and mother; Heart attack in her brother; Heart disease in her father. There is no history of Colon cancer, Colon polyps, Esophageal cancer, Rectal cancer, or Stomach cancer.  ROS:   Please see the history of present illness.    All other systems reviewed and are negative.  EKGs/Labs/Other Studies Reviewed:    The following studies were reviewed today:  EKG:  EKG is ordered today.  The ekg ordered today demonstrates normal sinus rhythm, incomplete right bundle branch block, rate 96  Recent Labs: 07/06/2019: ALT 25; BUN 13; Creatinine, Ser 1.06; Hemoglobin  15.2; Platelets 193; Potassium 4.0; Sodium 138  Recent Lipid Panel No results found for: CHOL, TRIG, HDL, CHOLHDL, VLDL, LDLCALC, LDLDIRECT  Physical Exam:    VS:  BP 140/82   Pulse 94   Temp (!) 97.2 F (36.2 C)   Ht 5' (1.524 m)   Wt 134 lb (60.8 kg)   SpO2 97%   BMI 26.17 kg/m     Wt Readings from Last 3 Encounters:  12/09/19 134 lb (60.8 kg)  09/25/19 128 lb 12.8 oz (58.4 kg)  09/04/19 130 lb (59 kg)     GEN: Well nourished, well developed in no acute distress HEENT: Normal NECK: No JVD; No carotid bruits CARDIAC: RRR, no murmurs, rubs, gallops RESPIRATORY:  Clear to auscultation without rales, wheezing or rhonchi  ABDOMEN: Soft, non-tender, non-distended MUSCULOSKELETAL:  No edema; No deformity  SKIN: Warm and dry NEUROLOGIC:  Alert and oriented x 3 PSYCHIATRIC:  Normal affect   ASSESSMENT:    1. Palpitations   2. Chest pain of uncertain etiology   3. Essential hypertension   4. Hyperlipidemia, unspecified hyperlipidemia type    PLAN:    Palpitations: Description concerning for arrhythmia, will evaluate with Zio patch x2 weeks  Chest pain/dyspnea on exertion: Chest pain is atypical in description, but does have CAD risk factors (age, family history,  hyperlipidemia).  In addition she is having progressive dyspnea on exertion, which could represent anginal equivalent.  Will evaluate further with Lexiscan Myoview.  Aortic valve calcification seen on recent CT scan, will evaluate further with echocardiogram  Hyperlipidemia: On atorvastatin 10 mg twice weekly.  LDL 99 on 11/06/2019.  Hypertension: On losartan 50 mg daily.  Mildly elevated in clinic today, but appears controlled on home BP log.  Will continue to monitor.  RTC in 3 months  Medication Adjustments/Labs and Tests Ordered: Current medicines are reviewed at length with the patient today.  Concerns regarding medicines are outlined above.  Orders Placed This Encounter  Procedures  . MYOCARDIAL PERFUSION IMAGING  . LONG TERM MONITOR (3-14 DAYS)  . EKG 12-Lead  . ECHOCARDIOGRAM COMPLETE   No orders of the defined types were placed in this encounter.   Patient Instructions  Medication Instructions:  Your physician recommends that you continue on your current medications as directed. Please refer to the Current Medication list given to you today.  Testing/Procedures: Your physician has requested that you have an echocardiogram. Echocardiography is a painless test that uses sound waves to create images of your heart. It provides your doctor with information about the size and shape of your heart and how well your heart's chambers and valves are working. This procedure takes approximately one hour. There are no restrictions for this procedure. This will be done at our [facility] location:  [place] has requested that you have a lexiscan myoview. For further information please visit [email or web address]. Please follow instruction sheet, as given.   ZIO XT- Long Term Monitor Instructions   Your physician has requested you wear your ZIO patch monitor 14 days.   This is a single patch monitor.  [employer] supplies one patch monitor per enrollment.   Additional stickers are not available.   Please do not apply patch if you will be having a Nuclear Stress Test, Echocardiogram, Cardiac CT, MRI, or Chest Xray during the time frame you would be wearing the monitor. The patch cannot be worn during these tests.  You cannot remove and re-apply the ZIO XT patch  monitor.   Your ZIO patch monitor will be sent USPS Priority mail from [facility] directly to your home address. The monitor may also be mailed to a PO BOX if home delivery is not available.   It may take 3-5 days to receive your monitor after you have been enrolled.   Once you have received you monitor, please review enclosed instructions.  Your monitor has already been registered assigning a specific monitor serial # to you.   Applying the monitor   Shave hair from upper left chest.   Hold abrader disc by orange tab.  Rub abrader in 40 strokes over left upper chest as indicated in your monitor instructions.   Clean area with 4 enclosed alcohol pads .  Use all pads to assure are is cleaned thoroughly.  Let dry.   Apply patch as indicated in monitor instructions.  Patch will be place under collarbone on left side of chest with arrow pointing upward.   Rub patch adhesive wings for 2 minutes.Remove white label marked "1".  Remove white label marked "2".  Rub patch adhesive wings for 2 additional minutes.   While looking in a mirror, press and release button in center of patch.  A small green light will flash 3-4 times .  This will be your only indicator the monitor has been turned on.     Do not shower for the first 24 hours.  You may shower after the first 24 hours.   Press button if you feel a symptom. You will hear a small click.  Record Date, Time and Symptom in the Patient Log Book.   When you are ready to remove patch, follow instructions on last 2 pages of Patient Log Book.  Stick patch monitor onto last page of Patient Log Book.   Place Patient Log Book in [place] box.   Use locking tab on box and tape box closed securely.  The [employer] and [employer] has [employer] on it.  Please place in mailbox as soon as possible.  Your physician should have your test results approximately 7 days after the monitor has been mailed back to [facility].   Call [place] at [phone] if you have questions regarding your ZIO XT patch monitor.  Call them immediately if you see an orange light blinking on your monitor.   If your monitor falls off in less than 4 days contact our Monitor department at [phone].  If your monitor becomes loose or falls off after 4 days call [employer] at [phone] for suggestions on securing your monitor.    Follow-Up: At [facility], you and your health needs are our priority.  As part of our continuing mission to provide you with exceptional heart care, we have created designated Provider Care Teams.  These Care Teams include your primary Cardiologist (physician) and Advanced Practice Providers (APPs -  Physician Assistants and Nurse Practitioners) who all work together to provide you with the care you need, when you need it.  We recommend signing up for the patient portal called "MyChart".  Sign up information is provided on this After Visit Summary.  MyChart is used to connect with patients for Virtual Visits (Telemedicine).  Patients are able to view lab/test results, encounter notes, upcoming appointments, etc.  Non-urgent messages can be sent to your provider as well.   To learn more about what you can do with MyChart, go to [email or web address].    Your next appointment:   3 month(s)  The  format for your next appointment:   In Person  Provider:   Oswaldo Milian, MD        Signed, Donato Heinz, MD  12/11/2019 3:10 PM    [place]

## 2019-12-09 NOTE — Patient Instructions (Signed)
Medication Instructions:  Your physician recommends that you continue on your current medications as directed. Please refer to the Current Medication list given to you today.  Testing/Procedures: Your physician has requested that you have an echocardiogram. Echocardiography is a painless test that uses sound waves to create images of your heart. It provides your doctor with information about the size and shape of your heart and how well your heart's chambers and valves are working. This procedure takes approximately one hour. There are no restrictions for this procedure. This will be done at our [facility] location:  [place] has requested that you have a lexiscan myoview. For further information please visit [email or web address]. Please follow instruction sheet, as given.   ZIO XT- Long Term Monitor Instructions   Your physician has requested you wear your ZIO patch monitor 14 days.   This is a single patch monitor.  [employer] supplies one patch monitor per enrollment.  Additional stickers are not available.   Please do not apply patch if you will be having a Nuclear Stress Test, Echocardiogram, Cardiac CT, MRI, or Chest Xray during the time frame you would be wearing the monitor. The patch cannot be worn during these tests.  You cannot remove and re-apply the ZIO XT patch monitor.   Your ZIO patch monitor will be sent USPS Priority mail from [facility] directly to your home address. The monitor may also be mailed to a PO BOX if home delivery is not available.   It may take 3-5 days to receive your monitor after you have been enrolled.   Once you have received you monitor, please review enclosed instructions.  Your monitor has already been registered assigning a specific monitor serial # to you.   Applying the monitor   Shave hair from upper left chest.   Hold abrader disc by orange tab.  Rub abrader in 40 strokes over left upper chest as  indicated in your monitor instructions.   Clean area with 4 enclosed alcohol pads .  Use all pads to assure are is cleaned thoroughly.  Let dry.   Apply patch as indicated in monitor instructions.  Patch will be place under collarbone on left side of chest with arrow pointing upward.   Rub patch adhesive wings for 2 minutes.Remove white label marked "1".  Remove white label marked "2".  Rub patch adhesive wings for 2 additional minutes.   While looking in a mirror, press and release button in center of patch.  A small green light will flash 3-4 times .  This will be your only indicator the monitor has been turned on.     Do not shower for the first 24 hours.  You may shower after the first 24 hours.   Press button if you feel a symptom. You will hear a small click.  Record Date, Time and Symptom in the Patient Log Book.   When you are ready to remove patch, follow instructions on last 2 pages of Patient Log Book.  Stick patch monitor onto last page of Patient Log Book.   Place Patient Log Book in [place] box.  Use locking tab on box and tape box closed securely.  The [employer] has [employer] on it.  Please place in mailbox as soon as possible.  Your physician should have your test results approximately 7 days after the monitor has been mailed back to [facility].   Call [place] at  [phone] if you have questions regarding your ZIO XT patch monitor.  Call them immediately if you see an orange light blinking on your monitor.   If your monitor falls off in less than 4 days contact our Monitor department at [phone].  If your monitor becomes loose or falls off after 4 days call [employer] at [phone] for suggestions on securing your monitor.    Follow-Up: At [facility], you and your health needs are our priority.  As part of our continuing mission to provide you with exceptional heart care, we have created designated Provider Care Teams.  These  Care Teams include your primary Cardiologist (physician) and Advanced Practice Providers (APPs -  Physician Assistants and Nurse Practitioners) who all work together to provide you with the care you need, when you need it.  We recommend signing up for the patient portal called "MyChart".  Sign up information is provided on this After Visit Summary.  MyChart is used to connect with patients for Virtual Visits (Telemedicine).  Patients are able to view lab/test results, encounter notes, upcoming appointments, etc.  Non-urgent messages can be sent to your provider as well.   To learn more about what you can do with MyChart, go to [email or web address].    Your next appointment:   3 month(s)  The format for your next appointment:   In Person  Provider:   Oswaldo Milian, MD

## 2019-12-09 NOTE — Telephone Encounter (Signed)
Enrolled patient for a 14 day Zio monitor to be mailed to patients home.  

## 2019-12-11 ENCOUNTER — Telehealth (HOSPITAL_COMMUNITY): Payer: Self-pay

## 2019-12-11 NOTE — Telephone Encounter (Signed)
Encounter complete. 

## 2019-12-16 ENCOUNTER — Other Ambulatory Visit: Payer: Self-pay

## 2019-12-16 ENCOUNTER — Ambulatory Visit (HOSPITAL_COMMUNITY)
Admission: RE | Admit: 2019-12-16 | Discharge: 2019-12-16 | Disposition: A | Discharge status: Home / Self Care | Payer: Medicare HMO | Source: Ambulatory Visit | Attending: Cardiology | Admitting: Cardiology

## 2019-12-16 DIAGNOSIS — R079 Chest pain, unspecified: Secondary | ICD-10-CM | POA: Diagnosis not present

## 2019-12-16 LAB — MYOCARDIAL PERFUSION IMAGING
LV dias vol: 82 mL (ref 46–106)
LV sys vol: 35 mL
Peak HR: 112 {beats}/min
Rest HR: 62 {beats}/min
SDS: 0
SRS: 2
SSS: 2
TID: 1.1

## 2019-12-16 MED ORDER — TECHNETIUM TC 99M TETROFOSMIN IV KIT
10.8000 | PACK | Freq: Once | INTRAVENOUS | Status: AC | PRN
  Administered 2019-12-16: 10.8 via INTRAVENOUS
  Filled 2019-12-16: qty 11

## 2019-12-16 MED ORDER — REGADENOSON 0.4 MG/5ML IV SOLN
0.4000 mg | Freq: Once | INTRAVENOUS | Status: AC
Start: 2019-12-16 — End: 2019-12-16
  Administered 2019-12-16: 0.4 mg via INTRAVENOUS

## 2019-12-16 MED ORDER — AMINOPHYLLINE 25 MG/ML IV SOLN
75.0000 mg | Freq: Once | INTRAVENOUS | Status: AC
  Administered 2019-12-16: 75 mg via INTRAVENOUS

## 2019-12-16 MED ORDER — TECHNETIUM TC 99M TETROFOSMIN IV KIT
31.8000 | PACK | Freq: Once | INTRAVENOUS | Status: AC | PRN
  Administered 2019-12-16: 31.8 via INTRAVENOUS
  Filled 2019-12-16: qty 32

## 2019-12-24 ENCOUNTER — Encounter (HOSPITAL_COMMUNITY): Payer: Medicare HMO

## 2019-12-28 ENCOUNTER — Ambulatory Visit (HOSPITAL_COMMUNITY): Payer: Medicare HMO | Attending: Cardiology

## 2019-12-28 ENCOUNTER — Other Ambulatory Visit: Payer: Self-pay

## 2019-12-28 DIAGNOSIS — R002 Palpitations: Secondary | ICD-10-CM | POA: Diagnosis not present

## 2019-12-28 DIAGNOSIS — J45909 Unspecified asthma, uncomplicated: Secondary | ICD-10-CM | POA: Diagnosis not present

## 2019-12-28 DIAGNOSIS — D649 Anemia, unspecified: Secondary | ICD-10-CM | POA: Diagnosis not present

## 2019-12-28 DIAGNOSIS — I10 Essential (primary) hypertension: Secondary | ICD-10-CM | POA: Diagnosis not present

## 2019-12-28 DIAGNOSIS — G4730 Sleep apnea, unspecified: Secondary | ICD-10-CM | POA: Diagnosis not present

## 2019-12-28 DIAGNOSIS — E785 Hyperlipidemia, unspecified: Secondary | ICD-10-CM | POA: Insufficient documentation

## 2019-12-28 DIAGNOSIS — R079 Chest pain, unspecified: Secondary | ICD-10-CM | POA: Diagnosis not present

## 2019-12-28 LAB — ECHOCARDIOGRAM COMPLETE: S' Lateral: 1.7 cm

## 2019-12-29 ENCOUNTER — Other Ambulatory Visit (INDEPENDENT_AMBULATORY_CARE_PROVIDER_SITE_OTHER): Payer: Medicare HMO

## 2019-12-29 DIAGNOSIS — R002 Palpitations: Secondary | ICD-10-CM | POA: Diagnosis not present

## 2020-01-01 DIAGNOSIS — H401113 Primary open-angle glaucoma, right eye, severe stage: Secondary | ICD-10-CM | POA: Diagnosis not present

## 2020-01-01 DIAGNOSIS — H401122 Primary open-angle glaucoma, left eye, moderate stage: Secondary | ICD-10-CM | POA: Diagnosis not present

## 2020-01-01 DIAGNOSIS — H35371 Puckering of macula, right eye: Secondary | ICD-10-CM | POA: Diagnosis not present

## 2020-01-05 DIAGNOSIS — H01114 Allergic dermatitis of left upper eyelid: Secondary | ICD-10-CM | POA: Diagnosis not present

## 2020-01-05 DIAGNOSIS — H35371 Puckering of macula, right eye: Secondary | ICD-10-CM | POA: Diagnosis not present

## 2020-01-05 DIAGNOSIS — H01112 Allergic dermatitis of right lower eyelid: Secondary | ICD-10-CM | POA: Diagnosis not present

## 2020-01-05 DIAGNOSIS — H401113 Primary open-angle glaucoma, right eye, severe stage: Secondary | ICD-10-CM | POA: Diagnosis not present

## 2020-01-05 DIAGNOSIS — H401122 Primary open-angle glaucoma, left eye, moderate stage: Secondary | ICD-10-CM | POA: Diagnosis not present

## 2020-01-05 DIAGNOSIS — H01111 Allergic dermatitis of right upper eyelid: Secondary | ICD-10-CM | POA: Diagnosis not present

## 2020-01-05 DIAGNOSIS — H01115 Allergic dermatitis of left lower eyelid: Secondary | ICD-10-CM | POA: Diagnosis not present

## 2020-01-07 DIAGNOSIS — G4700 Insomnia, unspecified: Secondary | ICD-10-CM | POA: Diagnosis not present

## 2020-01-07 DIAGNOSIS — E7800 Pure hypercholesterolemia, unspecified: Secondary | ICD-10-CM | POA: Diagnosis not present

## 2020-01-07 DIAGNOSIS — G43009 Migraine without aura, not intractable, without status migrainosus: Secondary | ICD-10-CM | POA: Diagnosis not present

## 2020-01-07 DIAGNOSIS — R69 Illness, unspecified: Secondary | ICD-10-CM | POA: Diagnosis not present

## 2020-01-07 DIAGNOSIS — N183 Chronic kidney disease, stage 3 unspecified: Secondary | ICD-10-CM | POA: Diagnosis not present

## 2020-01-07 DIAGNOSIS — H1013 Acute atopic conjunctivitis, bilateral: Secondary | ICD-10-CM | POA: Diagnosis not present

## 2020-01-07 DIAGNOSIS — I10 Essential (primary) hypertension: Secondary | ICD-10-CM | POA: Diagnosis not present

## 2020-01-07 DIAGNOSIS — Z0000 Encounter for general adult medical examination without abnormal findings: Secondary | ICD-10-CM | POA: Diagnosis not present

## 2020-01-07 DIAGNOSIS — M810 Age-related osteoporosis without current pathological fracture: Secondary | ICD-10-CM | POA: Diagnosis not present

## 2020-01-07 DIAGNOSIS — I129 Hypertensive chronic kidney disease with stage 1 through stage 4 chronic kidney disease, or unspecified chronic kidney disease: Secondary | ICD-10-CM | POA: Diagnosis not present

## 2020-01-08 DIAGNOSIS — H401133 Primary open-angle glaucoma, bilateral, severe stage: Secondary | ICD-10-CM | POA: Diagnosis not present

## 2020-01-08 DIAGNOSIS — H01114 Allergic dermatitis of left upper eyelid: Secondary | ICD-10-CM | POA: Diagnosis not present

## 2020-01-08 DIAGNOSIS — H01111 Allergic dermatitis of right upper eyelid: Secondary | ICD-10-CM | POA: Diagnosis not present

## 2020-01-08 DIAGNOSIS — H1033 Unspecified acute conjunctivitis, bilateral: Secondary | ICD-10-CM | POA: Diagnosis not present

## 2020-01-12 DIAGNOSIS — H01114 Allergic dermatitis of left upper eyelid: Secondary | ICD-10-CM | POA: Diagnosis not present

## 2020-01-12 DIAGNOSIS — H401113 Primary open-angle glaucoma, right eye, severe stage: Secondary | ICD-10-CM | POA: Diagnosis not present

## 2020-01-12 DIAGNOSIS — H401122 Primary open-angle glaucoma, left eye, moderate stage: Secondary | ICD-10-CM | POA: Diagnosis not present

## 2020-01-12 DIAGNOSIS — H01111 Allergic dermatitis of right upper eyelid: Secondary | ICD-10-CM | POA: Diagnosis not present

## 2020-01-12 DIAGNOSIS — H01112 Allergic dermatitis of right lower eyelid: Secondary | ICD-10-CM | POA: Diagnosis not present

## 2020-01-12 DIAGNOSIS — H35371 Puckering of macula, right eye: Secondary | ICD-10-CM | POA: Diagnosis not present

## 2020-01-12 DIAGNOSIS — H01115 Allergic dermatitis of left lower eyelid: Secondary | ICD-10-CM | POA: Diagnosis not present

## 2020-01-13 DIAGNOSIS — H6983 Other specified disorders of Eustachian tube, bilateral: Secondary | ICD-10-CM | POA: Diagnosis not present

## 2020-01-13 DIAGNOSIS — H6123 Impacted cerumen, bilateral: Secondary | ICD-10-CM | POA: Diagnosis not present

## 2020-01-13 DIAGNOSIS — J342 Deviated nasal septum: Secondary | ICD-10-CM | POA: Diagnosis not present

## 2020-01-13 DIAGNOSIS — J310 Chronic rhinitis: Secondary | ICD-10-CM | POA: Diagnosis not present

## 2020-01-14 ENCOUNTER — Telehealth: Payer: Self-pay | Admitting: Emergency Medicine

## 2020-01-14 NOTE — Telephone Encounter (Signed)
Called spoke with patient let her know that if she uploaded the cards to mychart we could get this information added to her chart.   She voiced understanding Nothing further needed at this time.    she is fully vaccinated. She is received Pfizer vaccine, 1st 07/21/2019(lot#[id number]), 2nd 08/11/2019(lot#[id number]) -pr

## 2020-01-20 DIAGNOSIS — R002 Palpitations: Secondary | ICD-10-CM | POA: Diagnosis not present

## 2020-01-21 DIAGNOSIS — M545 Low back pain: Secondary | ICD-10-CM | POA: Diagnosis not present

## 2020-01-21 DIAGNOSIS — M6281 Muscle weakness (generalized): Secondary | ICD-10-CM | POA: Diagnosis not present

## 2020-01-21 DIAGNOSIS — G894 Chronic pain syndrome: Secondary | ICD-10-CM | POA: Diagnosis not present

## 2020-01-26 DIAGNOSIS — M6281 Muscle weakness (generalized): Secondary | ICD-10-CM | POA: Diagnosis not present

## 2020-01-26 DIAGNOSIS — H0100A Unspecified blepharitis right eye, upper and lower eyelids: Secondary | ICD-10-CM | POA: Diagnosis not present

## 2020-01-26 DIAGNOSIS — H00014 Hordeolum externum left upper eyelid: Secondary | ICD-10-CM | POA: Diagnosis not present

## 2020-01-26 DIAGNOSIS — G894 Chronic pain syndrome: Secondary | ICD-10-CM | POA: Diagnosis not present

## 2020-01-26 DIAGNOSIS — H0100B Unspecified blepharitis left eye, upper and lower eyelids: Secondary | ICD-10-CM | POA: Diagnosis not present

## 2020-01-26 DIAGNOSIS — M545 Low back pain: Secondary | ICD-10-CM | POA: Diagnosis not present

## 2020-01-29 DIAGNOSIS — M545 Low back pain: Secondary | ICD-10-CM | POA: Diagnosis not present

## 2020-01-29 DIAGNOSIS — M6281 Muscle weakness (generalized): Secondary | ICD-10-CM | POA: Diagnosis not present

## 2020-01-29 DIAGNOSIS — G894 Chronic pain syndrome: Secondary | ICD-10-CM | POA: Diagnosis not present

## 2020-02-03 DIAGNOSIS — M545 Low back pain: Secondary | ICD-10-CM | POA: Diagnosis not present

## 2020-02-03 DIAGNOSIS — M6281 Muscle weakness (generalized): Secondary | ICD-10-CM | POA: Diagnosis not present

## 2020-02-03 DIAGNOSIS — G894 Chronic pain syndrome: Secondary | ICD-10-CM | POA: Diagnosis not present

## 2020-02-05 DIAGNOSIS — M6281 Muscle weakness (generalized): Secondary | ICD-10-CM | POA: Diagnosis not present

## 2020-02-05 DIAGNOSIS — M545 Low back pain: Secondary | ICD-10-CM | POA: Diagnosis not present

## 2020-02-05 DIAGNOSIS — G894 Chronic pain syndrome: Secondary | ICD-10-CM | POA: Diagnosis not present

## 2020-02-10 DIAGNOSIS — G894 Chronic pain syndrome: Secondary | ICD-10-CM | POA: Diagnosis not present

## 2020-02-10 DIAGNOSIS — M6281 Muscle weakness (generalized): Secondary | ICD-10-CM | POA: Diagnosis not present

## 2020-02-10 DIAGNOSIS — M545 Low back pain: Secondary | ICD-10-CM | POA: Diagnosis not present

## 2020-02-11 DIAGNOSIS — M545 Low back pain: Secondary | ICD-10-CM | POA: Diagnosis not present

## 2020-02-11 DIAGNOSIS — H35371 Puckering of macula, right eye: Secondary | ICD-10-CM | POA: Diagnosis not present

## 2020-02-11 DIAGNOSIS — M6281 Muscle weakness (generalized): Secondary | ICD-10-CM | POA: Diagnosis not present

## 2020-02-11 DIAGNOSIS — Z961 Presence of intraocular lens: Secondary | ICD-10-CM | POA: Diagnosis not present

## 2020-02-11 DIAGNOSIS — G894 Chronic pain syndrome: Secondary | ICD-10-CM | POA: Diagnosis not present

## 2020-02-11 DIAGNOSIS — H401132 Primary open-angle glaucoma, bilateral, moderate stage: Secondary | ICD-10-CM | POA: Diagnosis not present

## 2020-02-17 DIAGNOSIS — M545 Low back pain: Secondary | ICD-10-CM | POA: Diagnosis not present

## 2020-02-17 DIAGNOSIS — M6281 Muscle weakness (generalized): Secondary | ICD-10-CM | POA: Diagnosis not present

## 2020-02-17 DIAGNOSIS — G894 Chronic pain syndrome: Secondary | ICD-10-CM | POA: Diagnosis not present

## 2020-02-19 DIAGNOSIS — G894 Chronic pain syndrome: Secondary | ICD-10-CM | POA: Diagnosis not present

## 2020-02-19 DIAGNOSIS — M545 Low back pain: Secondary | ICD-10-CM | POA: Diagnosis not present

## 2020-02-19 DIAGNOSIS — M6281 Muscle weakness (generalized): Secondary | ICD-10-CM | POA: Diagnosis not present

## 2020-02-24 DIAGNOSIS — G894 Chronic pain syndrome: Secondary | ICD-10-CM | POA: Diagnosis not present

## 2020-02-24 DIAGNOSIS — M545 Low back pain: Secondary | ICD-10-CM | POA: Diagnosis not present

## 2020-02-24 DIAGNOSIS — M6281 Muscle weakness (generalized): Secondary | ICD-10-CM | POA: Diagnosis not present

## 2020-03-01 DIAGNOSIS — M6281 Muscle weakness (generalized): Secondary | ICD-10-CM | POA: Diagnosis not present

## 2020-03-01 DIAGNOSIS — G894 Chronic pain syndrome: Secondary | ICD-10-CM | POA: Diagnosis not present

## 2020-03-02 DIAGNOSIS — Z1283 Encounter for screening for malignant neoplasm of skin: Secondary | ICD-10-CM | POA: Diagnosis not present

## 2020-03-02 DIAGNOSIS — L821 Other seborrheic keratosis: Secondary | ICD-10-CM | POA: Diagnosis not present

## 2020-03-02 DIAGNOSIS — I872 Venous insufficiency (chronic) (peripheral): Secondary | ICD-10-CM | POA: Diagnosis not present

## 2020-03-02 DIAGNOSIS — D225 Melanocytic nevi of trunk: Secondary | ICD-10-CM | POA: Diagnosis not present

## 2020-03-03 DIAGNOSIS — G894 Chronic pain syndrome: Secondary | ICD-10-CM | POA: Diagnosis not present

## 2020-03-03 DIAGNOSIS — M6281 Muscle weakness (generalized): Secondary | ICD-10-CM | POA: Diagnosis not present

## 2020-03-09 DIAGNOSIS — H401122 Primary open-angle glaucoma, left eye, moderate stage: Secondary | ICD-10-CM | POA: Diagnosis not present

## 2020-03-09 DIAGNOSIS — H401113 Primary open-angle glaucoma, right eye, severe stage: Secondary | ICD-10-CM | POA: Diagnosis not present

## 2020-03-09 DIAGNOSIS — H35371 Puckering of macula, right eye: Secondary | ICD-10-CM | POA: Diagnosis not present

## 2020-03-10 DIAGNOSIS — M6281 Muscle weakness (generalized): Secondary | ICD-10-CM | POA: Diagnosis not present

## 2020-03-10 DIAGNOSIS — G894 Chronic pain syndrome: Secondary | ICD-10-CM | POA: Diagnosis not present

## 2020-03-11 DIAGNOSIS — M6281 Muscle weakness (generalized): Secondary | ICD-10-CM | POA: Diagnosis not present

## 2020-03-11 DIAGNOSIS — G894 Chronic pain syndrome: Secondary | ICD-10-CM | POA: Diagnosis not present

## 2020-03-13 NOTE — Progress Notes (Signed)
Cardiology Office Note:    Date:  03/15/2020   ID:  Anna Arnold, DOB 1951/02/16, MRN [id number]  PCP:  Vernie Shanks, MD  Cardiologist:  No primary care provider on file.  Electrophysiologist:  None   Referring MD: Vernie Shanks, MD   Chief Complaint  Patient presents with  . Palpitations    History of Present Illness:    Anna Arnold is a 69 y.o. female with a hx of fibromyalgia, hyperlipidemia who presents for follow-up.  She was referred by Dr. Jacelyn Grip for evaluation of palpitations, initially seen on 12/09/2019.  She reports that her palpitations started 6 weeks prior.  Occurs 2-3 times per week, feels like her heart is racing.  Will last for 2 minutes or so and resolve.  Always occurs at night, especially when she is lying on her left side.  She denies any lightheadedness or syncope during episodes, but does report she has lightheadedness sometimes during the day.  Also reports she has been having some swelling in her left ankle.  In addition, she states that she had some chest pain one week prior.  Describes as left-sided dull aching pain.  Lasted for 1 to 1.5 hours.  For exercise she goes for walks.  Typically does about 10,000 steps per day between walking and her job working at a preschool.  Reports she has noted progressive fatigue and shortness of breath with exertion.  No smoking history.  Family history includes father died of MI at age 83, though she thinks he had a stent at some point prior to this.  Her oldest brother died of MI at age 72.  She is taking atorvastatin 10 mg twice weekly, has been unable to tolerate more frequent dosing.   Lexiscan Myoview on 12/16/2019 showed normal perfusion, EF 58%.  Echocardiogram on 12/28/2019 showed normal biventricular function, no significant valvular disease.  Zio patch x14 days showed one 10 beat run of NSVT, otherwise no significant abnormalities.  Since last clinic visit, she reports she is doing well.  Reports rare palpitations.   States that she has had no further chest pain.  Does report some shortness of breath with exertion.  For exercise, reports that she walks frequently and does physical therapy.   Past Medical History:  Diagnosis Date  . Anxiety   . Arthritis   . Cataracts, bilateral   . Cervical spondylosis   . Degenerative disc disease, lumbar   . Depression   . Fibromyalgia   . GERD (gastroesophageal reflux disease)   . Glaucoma   . Hypertension   . Migraines   . Osteopenia   . Seasonal allergies   . Sleep disorder     Past Surgical History:  Procedure Laterality Date  . bunionectomy Right 2008  . CATARACT EXTRACTION Bilateral 2015-2016  . [place]  . GLAUCOMA SURGERY Bilateral 2013  . HAMMER TOE SURGERY Right 2008  . ROTATOR CUFF REPAIR Right 2012    Current Medications: Current Meds  Medication Sig  . acetaminophen (TYLENOL) 500 MG tablet Take 500 mg by mouth every 6 (six) hours as needed.  [name] albuterol (VENTOLIN HFA) 108 (90 Base) MCG/ACT inhaler Inhale 2 puffs into the lungs every 6 (six) hours as needed for wheezing or shortness of breath.  [name] alendronate (FOSAMAX) 70 MG tablet Take 70 mg by mouth once a week. Take with a full glass of water on an empty stomach.  [name] atorvastatin (LIPITOR) 10 MG tablet Take 10 mg by  mouth 2 (two) times a week.  . bimatoprost (LUMIGAN) 0.01 % SOLN Place 1 drop into both eyes at bedtime.  [name] buPROPion (WELLBUTRIN SR) 100 MG 12 hr tablet Take 100 mg by mouth daily.  . Camphor-Menthol-Methyl Sal (SALONPAS) 3.06-02-08 % PTCH Apply 1 patch topically at bedtime as needed.  . Cholecalciferol (VITAMIN D3) 1000 units CAPS Take by mouth.  . losartan (COZAAR) 50 MG tablet Take 50 mg by mouth daily.  . Multiple Minerals-Vitamins (CALCIUM-MAGNESIUM-ZINC-D3 PO) Take by mouth.  . Omega-3 Fatty Acids (FISH OIL ULTRA) 1400 MG CAPS Take by mouth.  . pantoprazole (PROTONIX) 40 MG tablet Take 40 mg by mouth daily.  . SUMAtriptan (IMITREX) 100 MG tablet Take 100  mg by mouth every 2 (two) hours as needed for migraine. Take 1/2 tablet for migraine. May repeat in 2 hours if headache persists or recurs.   Current Facility-Administered Medications for the 03/15/20 encounter (Office Visit) with Donato Heinz, MD  Medication  . 0.9 %  sodium chloride infusion     Allergies:   Amitriptyline, Atenolol, Augmentin [amoxicillin-pot clavulanate], Biaxin [clarithromycin], Ceftin [cefuroxime axetil], Chlorthalidone, Citalopram hydrobromide, Erythromycin, Fluoxetine, Lexapro [escitalopram oxalate], Meloxicam, Potassium chloride, Pravastatin, Reglan [metoclopramide], Sertraline hcl, Simvastatin, Singulair [montelukast sodium], Travoprost, Viibryd [vilazodone hcl], Zegerid [omeprazole-sodium bicarbonate], Zegerid [omeprazole], and Brinzolamide   Social History   Socioeconomic History  . Marital status: Married    Spouse name: Not on file  . Number of children: Not on file  . Years of education: Not on file  . Highest education level: Not on file  Occupational History  . Not on file  Tobacco Use  . Smoking status: Never Smoker  . Smokeless tobacco: Never Used  Substance and Sexual Activity  . Alcohol use: No  . Drug use: No  . Sexual activity: Not on file  Other Topics Concern  . Not on file  Social History Narrative  . Not on file   Social Determinants of Health   Financial Resource Strain:   . Difficulty of Paying Living Expenses: Not on file  Food Insecurity:   . Worried About Charity fundraiser in the Last Year: Not on file  . Ran Out of Food in the Last Year: Not on file  Transportation Needs:   . Lack of Transportation (Medical): Not on file  . Lack of Transportation (Non-Medical): Not on file  Physical Activity:   . Days of Exercise per Week: Not on file  . Minutes of Exercise per Session: Not on file  Stress:   . Feeling of Stress : Not on file  Social Connections:   . Frequency of Communication with Friends and Family: Not on  file  . Frequency of Social Gatherings with Friends and Family: Not on file  . Attends Religious Services: Not on file  . Active Member of Clubs or Organizations: Not on file  . Attends Archivist Meetings: Not on file  . Marital Status: Not on file     Family History: The patient'sfamily history includes Cancer in her mother; Depression in her brother and mother; Heart attack in her brother; Heart disease in her father. There is no history of Colon cancer, Colon polyps, Esophageal cancer, Rectal cancer, or Stomach cancer.  ROS:   Please see the history of present illness.    All other systems reviewed and are negative.  EKGs/Labs/Other Studies Reviewed:    The following studies were reviewed today:  EKG:  EKG is not ordered today.  The ekg  ordered at prior clinic visit demonstrates normal sinus rhythm, incomplete right bundle branch block, rate 96  Recent Labs: 07/06/2019: ALT 25; BUN 13; Creatinine, Ser 1.06; Hemoglobin 15.2; Platelets 193; Potassium 4.0; Sodium 138  Recent Lipid Panel No results found for: CHOL, TRIG, HDL, CHOLHDL, VLDL, LDLCALC, LDLDIRECT  Physical Exam:    VS:  BP 122/82 Comment: right arm  Pulse 91   Ht 5' (1.524 m)   Wt 127 lb 9.6 oz (57.9 kg)   SpO2 99%   BMI 24.92 kg/m     Wt Readings from Last 3 Encounters:  03/15/20 127 lb 9.6 oz (57.9 kg)  12/16/19 134 lb (60.8 kg)  12/09/19 134 lb (60.8 kg)     GEN: Well nourished, well developed in no acute distress HEENT: Normal NECK: No JVD; No carotid bruits CARDIAC: RRR, no murmurs, rubs, gallops RESPIRATORY:  Clear to auscultation without rales, wheezing or rhonchi  ABDOMEN: Soft, non-tender, non-distended MUSCULOSKELETAL:  No edema; No deformity  SKIN: Warm and dry NEUROLOGIC:  Alert and oriented x 3 PSYCHIATRIC:  Normal affect   ASSESSMENT:    1. Palpitations   2. Hyperlipidemia, unspecified hyperlipidemia type   3. Chest pain of uncertain etiology   4. Essential hypertension     PLAN:    Palpitations: Zio patch x14 days showed one 10 beat run of NSVT, otherwise no significant abnormalities.  Reports palpitations have improved.  Chest pain/dyspnea on exertion: Chest pain is atypical in description, but does have CAD risk factors (age, family history, hyperlipidemia).  In addition she reports progressive dyspnea on exertion, which could represent anginal equivalent.  Lexiscan Myoview on 12/16/2019 showed normal perfusion, EF 58%.  Echocardiogram on 12/28/2019 showed normal biventricular function, no significant valvular disease.  Reports chest pain has resolved, no further cardiac work-up recommended.  Hyperlipidemia: On atorvastatin 10 mg twice weekly.  LDL 99 on 11/06/2019.  Will check calcium score to guide how aggressive to be in lowering cholesterol.  Hypertension: On losartan 50 mg daily.  Appears controlled.  RTC in 1 year  Medication Adjustments/Labs and Tests Ordered: Current medicines are reviewed at length with the patient today.  Concerns regarding medicines are outlined above.  Orders Placed This Encounter  Procedures  . CT CARDIAC SCORING   No orders of the defined types were placed in this encounter.   Patient Instructions  Medication Instructions:  Your physician recommends that you continue on your current medications as directed. Please refer to the Current Medication list given to you today.  *If you need a refill on your cardiac medications before your next appointment, please call your pharmacy*  Testing/Procedures: CT coronary calcium score. This test is done at [address]. This is $150 out of pocket.   Coronary CalciumScan A coronary calcium scan is an imaging test used to look for deposits of calcium and other fatty materials (plaques) in the inner lining of the blood vessels of the heart (coronary arteries). These deposits of calcium and plaques can partly clog and narrow the coronary arteries without producing any  symptoms or warning signs. This puts a person at risk for a heart attack. This test can detect these deposits before symptoms develop. Tell a health care provider about:  Any allergies you have.  All medicines you are taking, including vitamins, herbs, eye drops, creams, and over-the-counter medicines.  Any problems you or family members have had with anesthetic medicines.  Any blood disorders you have.  Any surgeries you have had.  Any medical conditions you have.  Whether you are pregnant or may be pregnant. What are the risks? Generally, this is a safe procedure. However, problems may occur, including:  Harm to a pregnant woman and her unborn baby. This test involves the use of radiation. Radiation exposure can be dangerous to a pregnant woman and her unborn baby. If you are pregnant, you generally should not have this procedure done.  Slight increase in the risk of cancer. This is because of the radiation involved in the test. What happens before the procedure? No preparation is needed for this procedure. What happens during the procedure?  You will undress and remove any jewelry around your neck or chest.  You will put on a hospital gown.  Sticky electrodes will be placed on your chest. The electrodes will be connected to an electrocardiogram (ECG) machine to record a tracing of the electrical activity of your heart.  A CT scanner will take pictures of your heart. During this time, you will be asked to lie still and hold your breath for 2-3 seconds while a picture of your heart is being taken. The procedure may vary among health care providers and hospitals. What happens after the procedure?  You can get dressed.  You can return to your normal activities.  It is up to you to get the results of your test. Ask your health care provider, or the department that is doing the test, when your results will be ready. Summary  A coronary calcium scan is an imaging test used to  look for deposits of calcium and other fatty materials (plaques) in the inner lining of the blood vessels of the heart (coronary arteries).  Generally, this is a safe procedure. Tell your health care provider if you are pregnant or may be pregnant.  No preparation is needed for this procedure.  A CT scanner will take pictures of your heart.  You can return to your normal activities after the scan is done. This information is not intended to replace advice given to you by your health care provider. Make sure you discuss any questions you have with your health care provider. Document Released: 11/10/2007 Document Revised: 04/02/2016 Document Reviewed: 04/02/2016 Elsevier Interactive Patient Education  2017 [place]: At [facility], you and your health needs are our priority.  As part of our continuing mission to provide you with exceptional heart care, we have created designated Provider Care Teams.  These Care Teams include your primary Cardiologist (physician) and Advanced Practice Providers (APPs -  Physician Assistants and Nurse Practitioners) who all work together to provide you with the care you need, when you need it.  We recommend signing up for the patient portal called "MyChart".  Sign up information is provided on this After Visit Summary.  MyChart is used to connect with patients for Virtual Visits (Telemedicine).  Patients are able to view lab/test results, encounter notes, upcoming appointments, etc.  Non-urgent messages can be sent to your provider as well.   To learn more about what you can do with MyChart, go to [email or web address].    Your next appointment:   12 month(s)  The format for your next appointment:   In Person  Provider:   Oswaldo Milian, MD      Signed, Donato Heinz, MD  03/15/2020 3:40 PM    [place]

## 2020-03-15 ENCOUNTER — Encounter: Payer: Self-pay | Admitting: Cardiology

## 2020-03-15 ENCOUNTER — Other Ambulatory Visit: Payer: Self-pay

## 2020-03-15 ENCOUNTER — Ambulatory Visit: Payer: Medicare HMO | Admitting: Cardiology

## 2020-03-15 ENCOUNTER — Ambulatory Visit (INDEPENDENT_AMBULATORY_CARE_PROVIDER_SITE_OTHER): Payer: Medicare HMO | Admitting: Cardiology

## 2020-03-15 VITALS — BP 122/82 | HR 91 | Ht 60.0 in | Wt 127.6 lb

## 2020-03-15 DIAGNOSIS — R079 Chest pain, unspecified: Secondary | ICD-10-CM | POA: Diagnosis not present

## 2020-03-15 DIAGNOSIS — R002 Palpitations: Secondary | ICD-10-CM

## 2020-03-15 DIAGNOSIS — I10 Essential (primary) hypertension: Secondary | ICD-10-CM

## 2020-03-15 DIAGNOSIS — E785 Hyperlipidemia, unspecified: Secondary | ICD-10-CM | POA: Diagnosis not present

## 2020-03-15 NOTE — Patient Instructions (Signed)
Medication Instructions:  Your physician recommends that you continue on your current medications as directed. Please refer to the Current Medication list given to you today.  *If you need a refill on your cardiac medications before your next appointment, please call your pharmacy*  Testing/Procedures: CT coronary calcium score. This test is done at [address]. This is $150 out of pocket.   Coronary CalciumScan A coronary calcium scan is an imaging test used to look for deposits of calcium and other fatty materials (plaques) in the inner lining of the blood vessels of the heart (coronary arteries). These deposits of calcium and plaques can partly clog and narrow the coronary arteries without producing any symptoms or warning signs. This puts a person at risk for a heart attack. This test can detect these deposits before symptoms develop. Tell a health care provider about:  Any allergies you have.  All medicines you are taking, including vitamins, herbs, eye drops, creams, and over-the-counter medicines.  Any problems you or family members have had with anesthetic medicines.  Any blood disorders you have.  Any surgeries you have had.  Any medical conditions you have.  Whether you are pregnant or may be pregnant. What are the risks? Generally, this is a safe procedure. However, problems may occur, including:  Harm to a pregnant woman and her unborn baby. This test involves the use of radiation. Radiation exposure can be dangerous to a pregnant woman and her unborn baby. If you are pregnant, you generally should not have this procedure done.  Slight increase in the risk of cancer. This is because of the radiation involved in the test. What happens before the procedure? No preparation is needed for this procedure. What happens during the procedure?  You will undress and remove any jewelry around your neck or chest.  You will put on a hospital gown.  Sticky  electrodes will be placed on your chest. The electrodes will be connected to an electrocardiogram (ECG) machine to record a tracing of the electrical activity of your heart.  A CT scanner will take pictures of your heart. During this time, you will be asked to lie still and hold your breath for 2-3 seconds while a picture of your heart is being taken. The procedure may vary among health care providers and hospitals. What happens after the procedure?  You can get dressed.  You can return to your normal activities.  It is up to you to get the results of your test. Ask your health care provider, or the department that is doing the test, when your results will be ready. Summary  A coronary calcium scan is an imaging test used to look for deposits of calcium and other fatty materials (plaques) in the inner lining of the blood vessels of the heart (coronary arteries).  Generally, this is a safe procedure. Tell your health care provider if you are pregnant or may be pregnant.  No preparation is needed for this procedure.  A CT scanner will take pictures of your heart.  You can return to your normal activities after the scan is done. This information is not intended to replace advice given to you by your health care provider. Make sure you discuss any questions you have with your health care provider. Document Released: 11/10/2007 Document Revised: 04/02/2016 Document Reviewed: 04/02/2016 Elsevier Interactive Patient Education  2017 [place]: At [facility], you and your health needs are our priority.  As part of our continuing  mission to provide you with exceptional heart care, we have created designated Provider Care Teams.  These Care Teams include your primary Cardiologist (physician) and Advanced Practice Providers (APPs -  Physician Assistants and Nurse Practitioners) who all work together to provide you with the care you need, when you need it.  We recommend signing up  for the patient portal called "MyChart".  Sign up information is provided on this After Visit Summary.  MyChart is used to connect with patients for Virtual Visits (Telemedicine).  Patients are able to view lab/test results, encounter notes, upcoming appointments, etc.  Non-urgent messages can be sent to your provider as well.   To learn more about what you can do with MyChart, go to [email or web address].    Your next appointment:   12 month(s)  The format for your next appointment:   In Person  Provider:   Oswaldo Milian, MD

## 2020-03-16 DIAGNOSIS — G894 Chronic pain syndrome: Secondary | ICD-10-CM | POA: Diagnosis not present

## 2020-03-16 DIAGNOSIS — M6281 Muscle weakness (generalized): Secondary | ICD-10-CM | POA: Diagnosis not present

## 2020-03-21 ENCOUNTER — Other Ambulatory Visit: Payer: Self-pay

## 2020-03-21 ENCOUNTER — Ambulatory Visit (INDEPENDENT_AMBULATORY_CARE_PROVIDER_SITE_OTHER)
Admission: RE | Admit: 2020-03-21 | Discharge: 2020-03-21 | Disposition: A | Discharge status: Home / Self Care | Payer: Self-pay | Source: Ambulatory Visit | Attending: Cardiology | Admitting: Cardiology

## 2020-03-21 DIAGNOSIS — E785 Hyperlipidemia, unspecified: Secondary | ICD-10-CM

## 2020-03-22 DIAGNOSIS — M6281 Muscle weakness (generalized): Secondary | ICD-10-CM | POA: Diagnosis not present

## 2020-03-22 DIAGNOSIS — G894 Chronic pain syndrome: Secondary | ICD-10-CM | POA: Diagnosis not present

## 2020-03-24 NOTE — Telephone Encounter (Signed)
RB - please advise.  Pt has been made aware that RB will not be back in the office until Monday.

## 2020-03-25 DIAGNOSIS — R5383 Other fatigue: Secondary | ICD-10-CM | POA: Diagnosis not present

## 2020-03-25 DIAGNOSIS — M791 Myalgia, unspecified site: Secondary | ICD-10-CM | POA: Diagnosis not present

## 2020-03-25 DIAGNOSIS — R69 Illness, unspecified: Secondary | ICD-10-CM | POA: Diagnosis not present

## 2020-03-28 DIAGNOSIS — H401113 Primary open-angle glaucoma, right eye, severe stage: Secondary | ICD-10-CM | POA: Diagnosis not present

## 2020-03-28 DIAGNOSIS — H401122 Primary open-angle glaucoma, left eye, moderate stage: Secondary | ICD-10-CM | POA: Diagnosis not present

## 2020-03-28 NOTE — Telephone Encounter (Signed)
Thanks for letting me know about the scan. I have reviewed, overall stable from her prior scan that showed some subtle interstitial findings. Nothing new that she needs to do prior to our planned office follow up

## 2020-03-30 DIAGNOSIS — G894 Chronic pain syndrome: Secondary | ICD-10-CM | POA: Diagnosis not present

## 2020-03-30 DIAGNOSIS — M6281 Muscle weakness (generalized): Secondary | ICD-10-CM | POA: Diagnosis not present

## 2020-04-07 DIAGNOSIS — M6281 Muscle weakness (generalized): Secondary | ICD-10-CM | POA: Diagnosis not present

## 2020-04-07 DIAGNOSIS — G894 Chronic pain syndrome: Secondary | ICD-10-CM | POA: Diagnosis not present

## 2020-04-13 DIAGNOSIS — M6281 Muscle weakness (generalized): Secondary | ICD-10-CM | POA: Diagnosis not present

## 2020-04-13 DIAGNOSIS — G894 Chronic pain syndrome: Secondary | ICD-10-CM | POA: Diagnosis not present

## 2020-04-14 DIAGNOSIS — I129 Hypertensive chronic kidney disease with stage 1 through stage 4 chronic kidney disease, or unspecified chronic kidney disease: Secondary | ICD-10-CM | POA: Diagnosis not present

## 2020-04-14 DIAGNOSIS — G43009 Migraine without aura, not intractable, without status migrainosus: Secondary | ICD-10-CM | POA: Diagnosis not present

## 2020-04-14 DIAGNOSIS — R69 Illness, unspecified: Secondary | ICD-10-CM | POA: Diagnosis not present

## 2020-04-14 DIAGNOSIS — R739 Hyperglycemia, unspecified: Secondary | ICD-10-CM | POA: Diagnosis not present

## 2020-04-14 DIAGNOSIS — E7800 Pure hypercholesterolemia, unspecified: Secondary | ICD-10-CM | POA: Diagnosis not present

## 2020-04-14 DIAGNOSIS — Z1159 Encounter for screening for other viral diseases: Secondary | ICD-10-CM | POA: Diagnosis not present

## 2020-04-14 DIAGNOSIS — N183 Chronic kidney disease, stage 3 unspecified: Secondary | ICD-10-CM | POA: Diagnosis not present

## 2020-04-14 DIAGNOSIS — G4700 Insomnia, unspecified: Secondary | ICD-10-CM | POA: Diagnosis not present

## 2020-04-14 DIAGNOSIS — M791 Myalgia, unspecified site: Secondary | ICD-10-CM | POA: Diagnosis not present

## 2020-04-14 DIAGNOSIS — Z23 Encounter for immunization: Secondary | ICD-10-CM | POA: Diagnosis not present

## 2020-04-20 DIAGNOSIS — M6281 Muscle weakness (generalized): Secondary | ICD-10-CM | POA: Diagnosis not present

## 2020-04-20 DIAGNOSIS — G894 Chronic pain syndrome: Secondary | ICD-10-CM | POA: Diagnosis not present

## 2020-04-28 ENCOUNTER — Telehealth: Payer: Self-pay | Admitting: Emergency Medicine

## 2020-04-28 NOTE — Telephone Encounter (Signed)
Pt aware she will receive a call about a month ahead of time to schedule

## 2020-05-02 DIAGNOSIS — Z20822 Contact with and (suspected) exposure to covid-19: Secondary | ICD-10-CM | POA: Diagnosis not present

## 2020-05-05 DIAGNOSIS — N183 Chronic kidney disease, stage 3 unspecified: Secondary | ICD-10-CM | POA: Diagnosis not present

## 2020-05-05 DIAGNOSIS — R69 Illness, unspecified: Secondary | ICD-10-CM | POA: Diagnosis not present

## 2020-05-05 DIAGNOSIS — G43009 Migraine without aura, not intractable, without status migrainosus: Secondary | ICD-10-CM | POA: Diagnosis not present

## 2020-05-05 DIAGNOSIS — G4700 Insomnia, unspecified: Secondary | ICD-10-CM | POA: Diagnosis not present

## 2020-05-05 DIAGNOSIS — M8580 Other specified disorders of bone density and structure, unspecified site: Secondary | ICD-10-CM | POA: Diagnosis not present

## 2020-05-05 DIAGNOSIS — M810 Age-related osteoporosis without current pathological fracture: Secondary | ICD-10-CM | POA: Diagnosis not present

## 2020-05-05 DIAGNOSIS — K219 Gastro-esophageal reflux disease without esophagitis: Secondary | ICD-10-CM | POA: Diagnosis not present

## 2020-05-05 DIAGNOSIS — E7800 Pure hypercholesterolemia, unspecified: Secondary | ICD-10-CM | POA: Diagnosis not present

## 2020-05-05 DIAGNOSIS — E785 Hyperlipidemia, unspecified: Secondary | ICD-10-CM | POA: Diagnosis not present

## 2020-05-05 DIAGNOSIS — I10 Essential (primary) hypertension: Secondary | ICD-10-CM | POA: Diagnosis not present

## 2020-05-11 DIAGNOSIS — M6281 Muscle weakness (generalized): Secondary | ICD-10-CM | POA: Diagnosis not present

## 2020-05-11 DIAGNOSIS — G894 Chronic pain syndrome: Secondary | ICD-10-CM | POA: Diagnosis not present

## 2020-05-26 DIAGNOSIS — M6281 Muscle weakness (generalized): Secondary | ICD-10-CM | POA: Diagnosis not present

## 2020-05-26 DIAGNOSIS — G894 Chronic pain syndrome: Secondary | ICD-10-CM | POA: Diagnosis not present

## 2020-05-31 IMAGING — CT CT CHEST HIGH RESOLUTION W/O CM
2 of 5 series · 15 of 36 positions shown, 18 images · non-contrast
Comparison: CT abdomen pelvis, 07/26/2019

CLINICAL DATA: Interstitial lung disease, shortness of breath,
abnormal lung bases on prior CT abdomen and pelvis

EXAM:
CT CHEST WITHOUT CONTRAST
TECHNIQUE: Multidetector CT imaging of the chest was performed following the
standard protocol without intravenous contrast. High resolution
imaging of the lungs, as well as inspiratory and expiratory imaging,
was performed.

[Series 2: thorax · axial · 0.66mm/px · z∈[-338,-44]mm · 12 of 161 slices shown, 15 images]
[im 7/161  mediastinal]
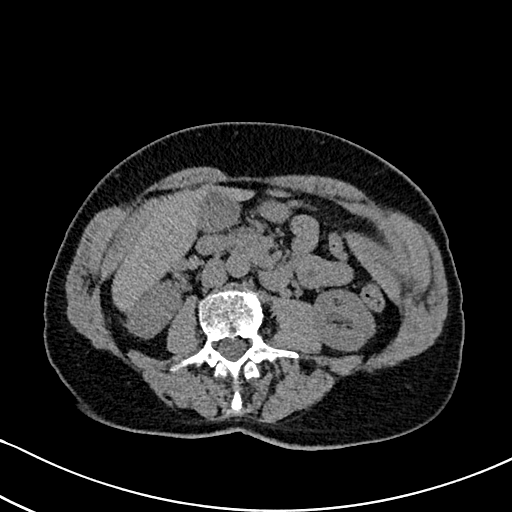
[im 7/161  lung]
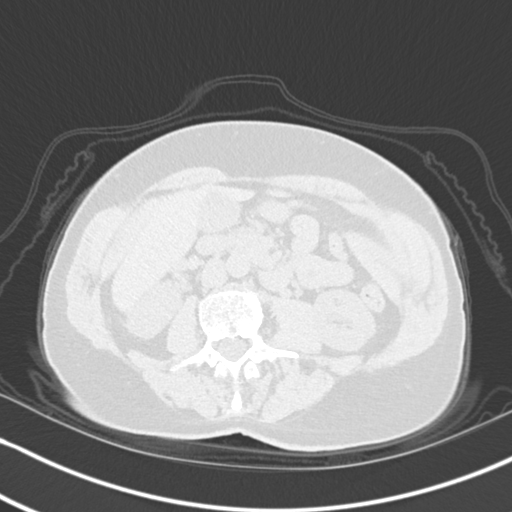
[im 21/161  lung]
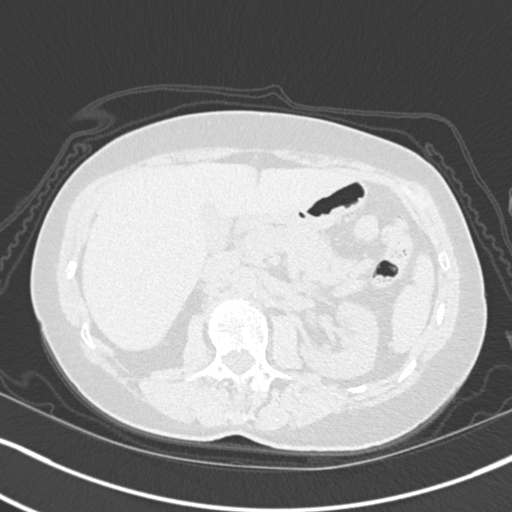
[im 35/161  lung]
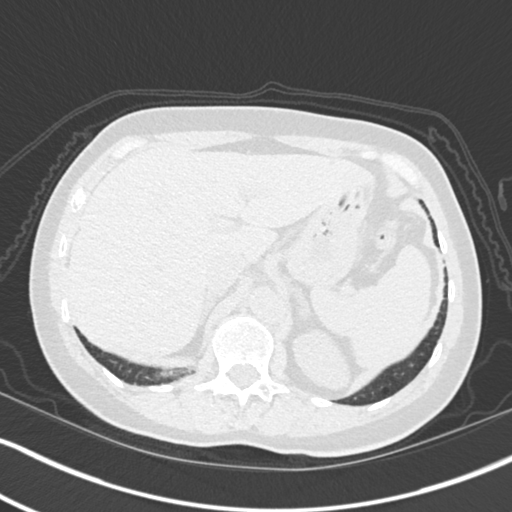
[im 49/161  lung]
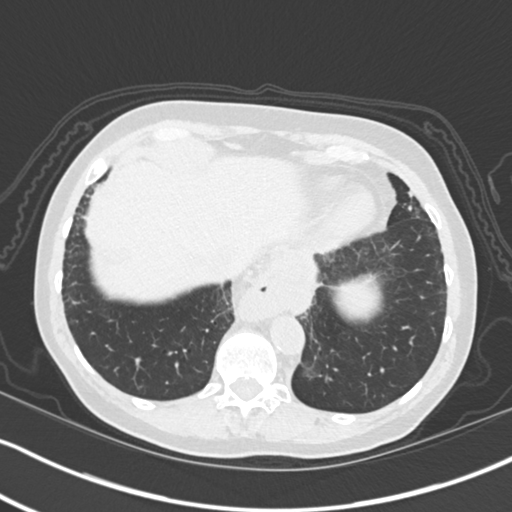
[im 63/161  mediastinal]
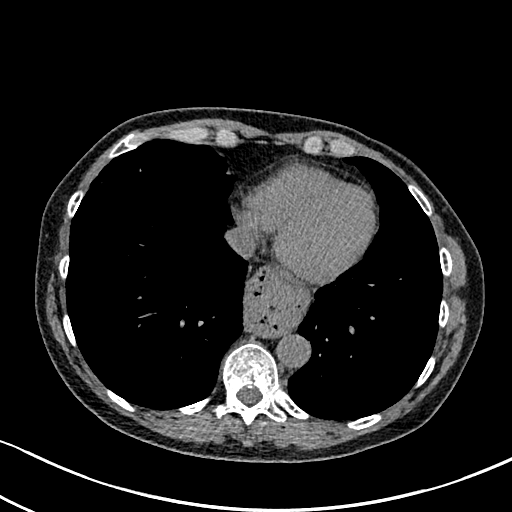
[im 63/161  lung]
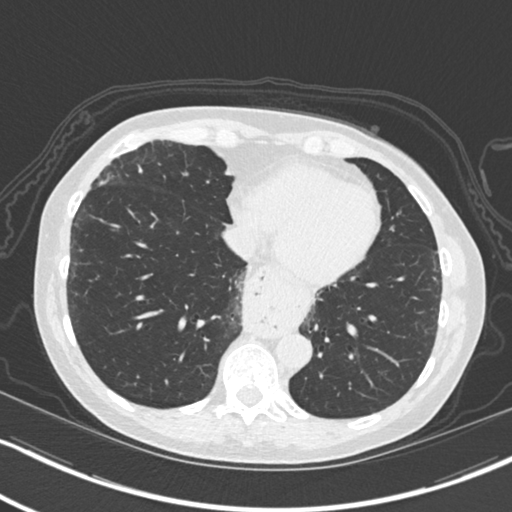
[im 77/161  lung]
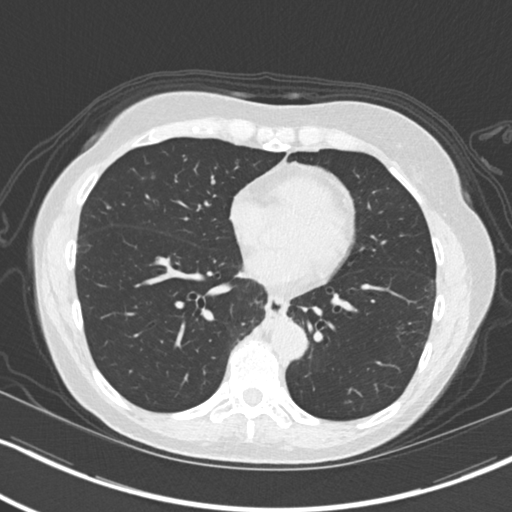
[im 84/161  lung]
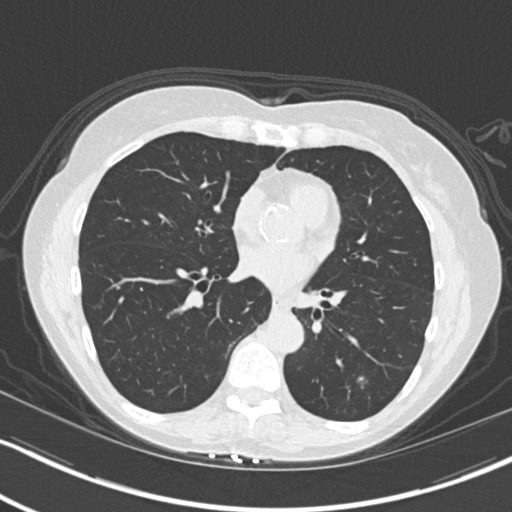
[im 98/161  lung]
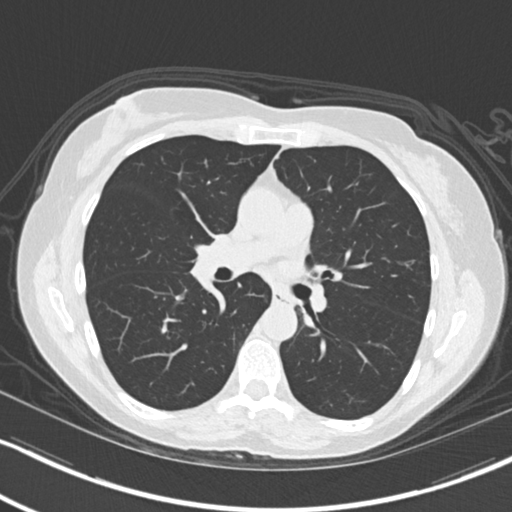
[im 112/161  mediastinal]
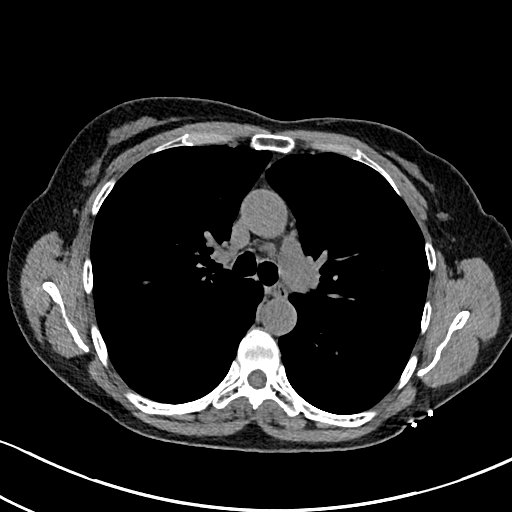
[im 112/161  lung]
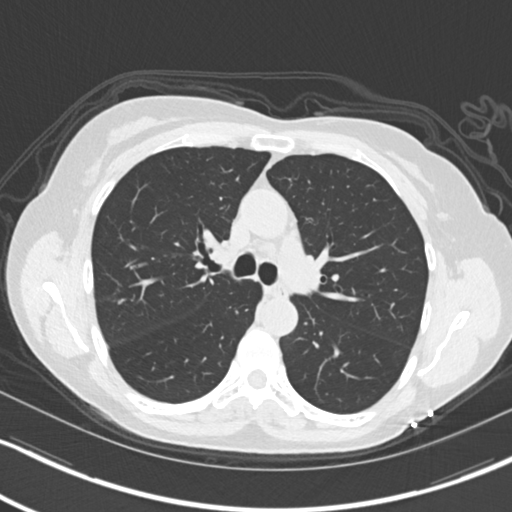
[im 126/161  lung]
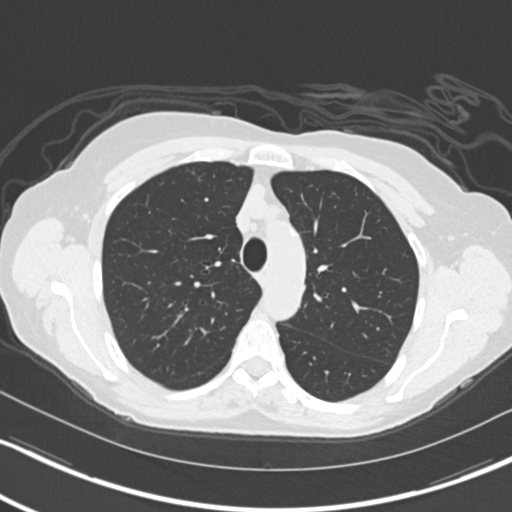
[im 140/161  lung]
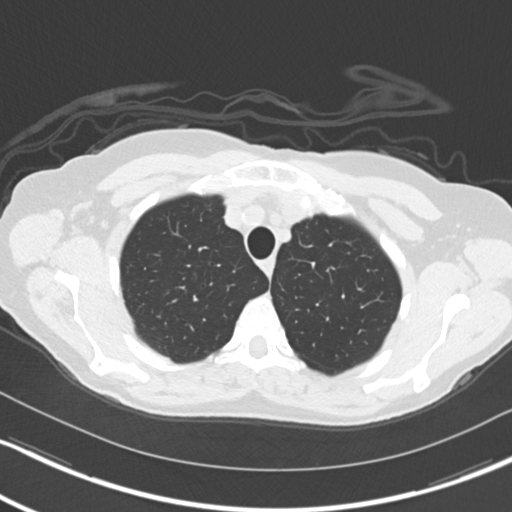
[im 154/161  lung]
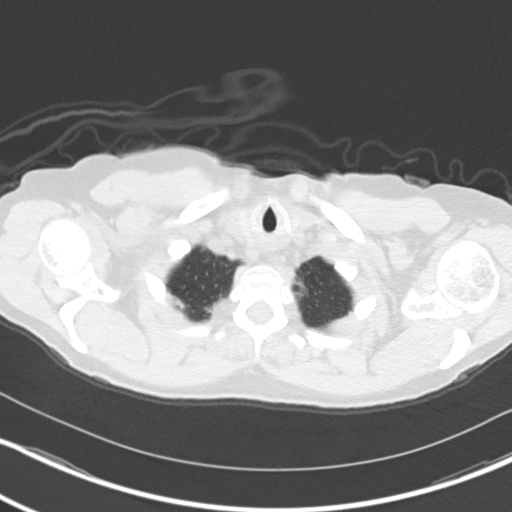

[Series 9: coronal · coronal · 0.61mm/px · 3 of 81 slices shown]
[im 17/81  lung]
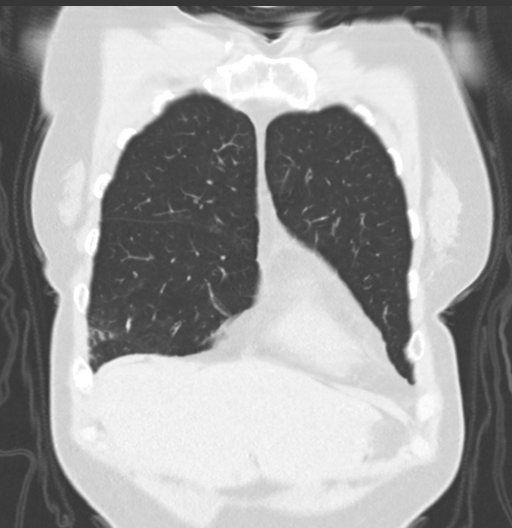
[im 33/81  lung]
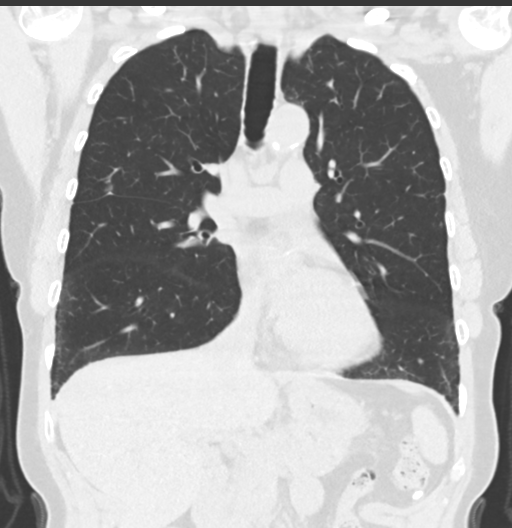
[im 49/81  lung]
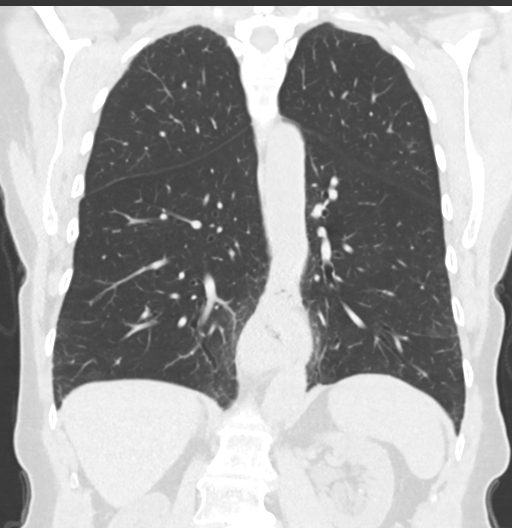

[15 of 36 positions shown; findings below may reference images not displayed]

FINDINGS: Cardiovascular: Aortic valve calcifications. Aortic atherosclerosis.
Normal heart size. No pericardial effusion.

Mediastinum/Nodes: No enlarged mediastinal, hilar, or axillary lymph
nodes. Small hiatal hernia. Thyroid gland, trachea, and esophagus
demonstrate no significant findings.

Lungs/Pleura: There is fine, peripheral reticular interstitial
opacity with an apical to basal gradient, most notable in the non
dependent portions of the lower lobes, lingula, and right middle
lobe. No significant air trapping on expiratory phase imaging.
Scattered bronchiolar mucous plugging throughout. Occasional tiny
pulmonary nodules measuring no larger than 2 mm, some of which are
clustered and tree-in-bud, for example in the left upper lobe (e.g.
series 4, image 118). No pleural effusion or pneumothorax.

Upper Abdomen: No acute abnormality.

Musculoskeletal: No chest wall mass or suspicious bone lesions
identified.
IMPRESSION: 1. There is fine, peripheral reticular interstitial opacity, with an
apical to basal gradient. No significant groundglass,
bronchiolectasis or honeycombing. No significant air trapping on
expiratory phase imaging. Findings are consistent with mild
pulmonary fibrosis in an early "indeterminate for UIP pattern" by
ATS pulmonary fibrosis criteria. Consider pulmonary referral and CT
follow-up in one year to assess for stability of fibrotic findings
and pattern. Findings are indeterminate for UIP per consensus
guidelines: Diagnosis of Idiopathic Pulmonary Fibrosis: An Official
ATS/ERS/JRS/ALAT Clinical Practice Guideline. Am J Respir Crit Care
Med Vol 198, Joshjax 5, [id number][DATE].

2. Occasional tiny pulmonary nodules measuring no larger than 2 mm,
some of which are clustered and tree-in-bud, for example in the left
upper lobe. There is scattered bronchiolar mucus plugging
throughout. Findings are most consistent with mild atypical
infection, potentially including atypical mycobacterium.

3. Aortic valve calcification. Aortic Atherosclerosis (0DS2V-DL2.2).

4.  Hiatal hernia.

## 2020-06-06 DIAGNOSIS — H401113 Primary open-angle glaucoma, right eye, severe stage: Secondary | ICD-10-CM | POA: Diagnosis not present

## 2020-06-06 DIAGNOSIS — H401122 Primary open-angle glaucoma, left eye, moderate stage: Secondary | ICD-10-CM | POA: Diagnosis not present

## 2020-06-13 DIAGNOSIS — I129 Hypertensive chronic kidney disease with stage 1 through stage 4 chronic kidney disease, or unspecified chronic kidney disease: Secondary | ICD-10-CM | POA: Diagnosis not present

## 2020-06-13 DIAGNOSIS — E7800 Pure hypercholesterolemia, unspecified: Secondary | ICD-10-CM | POA: Diagnosis not present

## 2020-06-13 DIAGNOSIS — R69 Illness, unspecified: Secondary | ICD-10-CM | POA: Diagnosis not present

## 2020-06-13 DIAGNOSIS — M8580 Other specified disorders of bone density and structure, unspecified site: Secondary | ICD-10-CM | POA: Diagnosis not present

## 2020-06-13 DIAGNOSIS — E785 Hyperlipidemia, unspecified: Secondary | ICD-10-CM | POA: Diagnosis not present

## 2020-06-13 DIAGNOSIS — I10 Essential (primary) hypertension: Secondary | ICD-10-CM | POA: Diagnosis not present

## 2020-06-13 DIAGNOSIS — G4700 Insomnia, unspecified: Secondary | ICD-10-CM | POA: Diagnosis not present

## 2020-06-13 DIAGNOSIS — G43009 Migraine without aura, not intractable, without status migrainosus: Secondary | ICD-10-CM | POA: Diagnosis not present

## 2020-06-13 DIAGNOSIS — M810 Age-related osteoporosis without current pathological fracture: Secondary | ICD-10-CM | POA: Diagnosis not present

## 2020-06-13 DIAGNOSIS — K219 Gastro-esophageal reflux disease without esophagitis: Secondary | ICD-10-CM | POA: Diagnosis not present

## 2020-06-29 DIAGNOSIS — M810 Age-related osteoporosis without current pathological fracture: Secondary | ICD-10-CM | POA: Diagnosis not present

## 2020-06-29 DIAGNOSIS — E7800 Pure hypercholesterolemia, unspecified: Secondary | ICD-10-CM | POA: Diagnosis not present

## 2020-06-29 DIAGNOSIS — G8929 Other chronic pain: Secondary | ICD-10-CM | POA: Diagnosis not present

## 2020-06-29 DIAGNOSIS — M8580 Other specified disorders of bone density and structure, unspecified site: Secondary | ICD-10-CM | POA: Diagnosis not present

## 2020-06-29 DIAGNOSIS — G4700 Insomnia, unspecified: Secondary | ICD-10-CM | POA: Diagnosis not present

## 2020-06-29 DIAGNOSIS — R69 Illness, unspecified: Secondary | ICD-10-CM | POA: Diagnosis not present

## 2020-06-29 DIAGNOSIS — G43009 Migraine without aura, not intractable, without status migrainosus: Secondary | ICD-10-CM | POA: Diagnosis not present

## 2020-06-29 DIAGNOSIS — K219 Gastro-esophageal reflux disease without esophagitis: Secondary | ICD-10-CM | POA: Diagnosis not present

## 2020-06-29 DIAGNOSIS — I129 Hypertensive chronic kidney disease with stage 1 through stage 4 chronic kidney disease, or unspecified chronic kidney disease: Secondary | ICD-10-CM | POA: Diagnosis not present

## 2020-06-29 DIAGNOSIS — I10 Essential (primary) hypertension: Secondary | ICD-10-CM | POA: Diagnosis not present

## 2020-07-13 ENCOUNTER — Ambulatory Visit
Admission: RE | Admit: 2020-07-13 | Discharge: 2020-07-13 | Disposition: A | Discharge status: Home / Self Care | Payer: Medicare HMO | Source: Ambulatory Visit | Attending: Emergency Medicine | Admitting: Emergency Medicine

## 2020-07-13 DIAGNOSIS — J849 Interstitial pulmonary disease, unspecified: Secondary | ICD-10-CM

## 2020-07-13 DIAGNOSIS — J479 Bronchiectasis, uncomplicated: Secondary | ICD-10-CM | POA: Diagnosis not present

## 2020-07-13 DIAGNOSIS — I2510 Atherosclerotic heart disease of native coronary artery without angina pectoris: Secondary | ICD-10-CM | POA: Diagnosis not present

## 2020-07-13 DIAGNOSIS — K449 Diaphragmatic hernia without obstruction or gangrene: Secondary | ICD-10-CM | POA: Diagnosis not present

## 2020-07-25 DIAGNOSIS — H35371 Puckering of macula, right eye: Secondary | ICD-10-CM | POA: Diagnosis not present

## 2020-07-25 DIAGNOSIS — H01114 Allergic dermatitis of left upper eyelid: Secondary | ICD-10-CM | POA: Diagnosis not present

## 2020-07-25 DIAGNOSIS — H01112 Allergic dermatitis of right lower eyelid: Secondary | ICD-10-CM | POA: Diagnosis not present

## 2020-07-25 DIAGNOSIS — H401113 Primary open-angle glaucoma, right eye, severe stage: Secondary | ICD-10-CM | POA: Diagnosis not present

## 2020-07-25 DIAGNOSIS — H01115 Allergic dermatitis of left lower eyelid: Secondary | ICD-10-CM | POA: Diagnosis not present

## 2020-07-25 DIAGNOSIS — H01111 Allergic dermatitis of right upper eyelid: Secondary | ICD-10-CM | POA: Diagnosis not present

## 2020-07-25 DIAGNOSIS — H401122 Primary open-angle glaucoma, left eye, moderate stage: Secondary | ICD-10-CM | POA: Diagnosis not present

## 2020-08-01 DIAGNOSIS — H8110 Benign paroxysmal vertigo, unspecified ear: Secondary | ICD-10-CM | POA: Diagnosis not present

## 2020-08-01 DIAGNOSIS — M542 Cervicalgia: Secondary | ICD-10-CM | POA: Diagnosis not present

## 2020-08-18 DIAGNOSIS — K130 Diseases of lips: Secondary | ICD-10-CM | POA: Diagnosis not present

## 2020-08-18 DIAGNOSIS — Z5181 Encounter for therapeutic drug level monitoring: Secondary | ICD-10-CM | POA: Diagnosis not present

## 2020-08-18 DIAGNOSIS — R69 Illness, unspecified: Secondary | ICD-10-CM | POA: Diagnosis not present

## 2020-08-18 DIAGNOSIS — F325 Major depressive disorder, single episode, in full remission: Secondary | ICD-10-CM | POA: Diagnosis not present

## 2020-08-18 DIAGNOSIS — H409 Unspecified glaucoma: Secondary | ICD-10-CM | POA: Diagnosis not present

## 2020-08-18 DIAGNOSIS — M5412 Radiculopathy, cervical region: Secondary | ICD-10-CM | POA: Diagnosis not present

## 2020-08-18 DIAGNOSIS — G4700 Insomnia, unspecified: Secondary | ICD-10-CM | POA: Diagnosis not present

## 2020-08-18 DIAGNOSIS — G8929 Other chronic pain: Secondary | ICD-10-CM | POA: Diagnosis not present

## 2020-08-18 DIAGNOSIS — K219 Gastro-esophageal reflux disease without esophagitis: Secondary | ICD-10-CM | POA: Diagnosis not present

## 2020-08-18 DIAGNOSIS — E785 Hyperlipidemia, unspecified: Secondary | ICD-10-CM | POA: Diagnosis not present

## 2020-08-18 DIAGNOSIS — G43009 Migraine without aura, not intractable, without status migrainosus: Secondary | ICD-10-CM | POA: Diagnosis not present

## 2020-08-18 DIAGNOSIS — M810 Age-related osteoporosis without current pathological fracture: Secondary | ICD-10-CM | POA: Diagnosis not present

## 2020-08-18 DIAGNOSIS — M25551 Pain in right hip: Secondary | ICD-10-CM | POA: Diagnosis not present

## 2020-08-18 DIAGNOSIS — I10 Essential (primary) hypertension: Secondary | ICD-10-CM | POA: Diagnosis not present

## 2020-08-23 DIAGNOSIS — I10 Essential (primary) hypertension: Secondary | ICD-10-CM | POA: Diagnosis not present

## 2020-08-23 DIAGNOSIS — H409 Unspecified glaucoma: Secondary | ICD-10-CM | POA: Diagnosis not present

## 2020-08-23 DIAGNOSIS — K219 Gastro-esophageal reflux disease without esophagitis: Secondary | ICD-10-CM | POA: Diagnosis not present

## 2020-08-23 DIAGNOSIS — E785 Hyperlipidemia, unspecified: Secondary | ICD-10-CM | POA: Diagnosis not present

## 2020-08-23 DIAGNOSIS — R69 Illness, unspecified: Secondary | ICD-10-CM | POA: Diagnosis not present

## 2020-08-23 DIAGNOSIS — I129 Hypertensive chronic kidney disease with stage 1 through stage 4 chronic kidney disease, or unspecified chronic kidney disease: Secondary | ICD-10-CM | POA: Diagnosis not present

## 2020-08-23 DIAGNOSIS — E7800 Pure hypercholesterolemia, unspecified: Secondary | ICD-10-CM | POA: Diagnosis not present

## 2020-08-23 DIAGNOSIS — M810 Age-related osteoporosis without current pathological fracture: Secondary | ICD-10-CM | POA: Diagnosis not present

## 2020-08-23 DIAGNOSIS — G4700 Insomnia, unspecified: Secondary | ICD-10-CM | POA: Diagnosis not present

## 2020-08-31 DIAGNOSIS — M4310 Spondylolisthesis, site unspecified: Secondary | ICD-10-CM | POA: Diagnosis not present

## 2020-09-01 DIAGNOSIS — R202 Paresthesia of skin: Secondary | ICD-10-CM | POA: Diagnosis not present

## 2020-09-01 DIAGNOSIS — S8992XA Unspecified injury of left lower leg, initial encounter: Secondary | ICD-10-CM | POA: Diagnosis not present

## 2020-09-01 DIAGNOSIS — M2392 Unspecified internal derangement of left knee: Secondary | ICD-10-CM | POA: Diagnosis not present

## 2020-09-01 DIAGNOSIS — M25551 Pain in right hip: Secondary | ICD-10-CM | POA: Diagnosis not present

## 2020-09-12 DIAGNOSIS — I129 Hypertensive chronic kidney disease with stage 1 through stage 4 chronic kidney disease, or unspecified chronic kidney disease: Secondary | ICD-10-CM | POA: Diagnosis not present

## 2020-09-12 DIAGNOSIS — M810 Age-related osteoporosis without current pathological fracture: Secondary | ICD-10-CM | POA: Diagnosis not present

## 2020-09-12 DIAGNOSIS — R69 Illness, unspecified: Secondary | ICD-10-CM | POA: Diagnosis not present

## 2020-09-12 DIAGNOSIS — F325 Major depressive disorder, single episode, in full remission: Secondary | ICD-10-CM | POA: Diagnosis not present

## 2020-09-12 DIAGNOSIS — K219 Gastro-esophageal reflux disease without esophagitis: Secondary | ICD-10-CM | POA: Diagnosis not present

## 2020-09-12 DIAGNOSIS — I10 Essential (primary) hypertension: Secondary | ICD-10-CM | POA: Diagnosis not present

## 2020-09-12 DIAGNOSIS — G43009 Migraine without aura, not intractable, without status migrainosus: Secondary | ICD-10-CM | POA: Diagnosis not present

## 2020-09-12 DIAGNOSIS — G4700 Insomnia, unspecified: Secondary | ICD-10-CM | POA: Diagnosis not present

## 2020-09-12 DIAGNOSIS — M8580 Other specified disorders of bone density and structure, unspecified site: Secondary | ICD-10-CM | POA: Diagnosis not present

## 2020-09-12 DIAGNOSIS — E7800 Pure hypercholesterolemia, unspecified: Secondary | ICD-10-CM | POA: Diagnosis not present

## 2020-09-15 ENCOUNTER — Other Ambulatory Visit: Payer: Self-pay | Admitting: Family Medicine

## 2020-09-15 ENCOUNTER — Other Ambulatory Visit (HOSPITAL_COMMUNITY): Payer: Self-pay | Admitting: Family Medicine

## 2020-09-15 DIAGNOSIS — M2392 Unspecified internal derangement of left knee: Secondary | ICD-10-CM

## 2020-09-15 DIAGNOSIS — M542 Cervicalgia: Secondary | ICD-10-CM | POA: Diagnosis not present

## 2020-09-15 DIAGNOSIS — M4310 Spondylolisthesis, site unspecified: Secondary | ICD-10-CM | POA: Diagnosis not present

## 2020-09-17 ENCOUNTER — Ambulatory Visit (HOSPITAL_COMMUNITY)
Admission: RE | Admit: 2020-09-17 | Discharge: 2020-09-17 | Disposition: A | Discharge status: Home / Self Care | Payer: Medicare HMO | Source: Ambulatory Visit | Attending: Family Medicine | Admitting: Family Medicine

## 2020-09-17 ENCOUNTER — Other Ambulatory Visit: Payer: Self-pay

## 2020-09-17 DIAGNOSIS — M25462 Effusion, left knee: Secondary | ICD-10-CM | POA: Diagnosis not present

## 2020-09-17 DIAGNOSIS — M1712 Unilateral primary osteoarthritis, left knee: Secondary | ICD-10-CM | POA: Diagnosis not present

## 2020-09-17 DIAGNOSIS — M2392 Unspecified internal derangement of left knee: Secondary | ICD-10-CM | POA: Insufficient documentation

## 2020-09-20 DIAGNOSIS — H4010X3 Unspecified open-angle glaucoma, severe stage: Secondary | ICD-10-CM | POA: Diagnosis not present

## 2020-09-20 DIAGNOSIS — Z880 Allergy status to penicillin: Secondary | ICD-10-CM | POA: Diagnosis not present

## 2020-09-20 DIAGNOSIS — E785 Hyperlipidemia, unspecified: Secondary | ICD-10-CM | POA: Diagnosis not present

## 2020-09-20 DIAGNOSIS — Z79899 Other long term (current) drug therapy: Secondary | ICD-10-CM | POA: Diagnosis not present

## 2020-09-20 DIAGNOSIS — Z881 Allergy status to other antibiotic agents status: Secondary | ICD-10-CM | POA: Diagnosis not present

## 2020-09-20 DIAGNOSIS — H401113 Primary open-angle glaucoma, right eye, severe stage: Secondary | ICD-10-CM | POA: Diagnosis not present

## 2020-09-20 DIAGNOSIS — G4730 Sleep apnea, unspecified: Secondary | ICD-10-CM | POA: Diagnosis not present

## 2020-09-20 DIAGNOSIS — I10 Essential (primary) hypertension: Secondary | ICD-10-CM | POA: Diagnosis not present

## 2020-09-20 DIAGNOSIS — Z888 Allergy status to other drugs, medicaments and biological substances status: Secondary | ICD-10-CM | POA: Diagnosis not present

## 2020-09-20 DIAGNOSIS — J45909 Unspecified asthma, uncomplicated: Secondary | ICD-10-CM | POA: Diagnosis not present

## 2020-09-29 DIAGNOSIS — M4310 Spondylolisthesis, site unspecified: Secondary | ICD-10-CM | POA: Diagnosis not present

## 2020-10-05 DIAGNOSIS — H401113 Primary open-angle glaucoma, right eye, severe stage: Secondary | ICD-10-CM | POA: Diagnosis not present

## 2020-11-02 DIAGNOSIS — I129 Hypertensive chronic kidney disease with stage 1 through stage 4 chronic kidney disease, or unspecified chronic kidney disease: Secondary | ICD-10-CM | POA: Diagnosis not present

## 2020-11-02 DIAGNOSIS — M8580 Other specified disorders of bone density and structure, unspecified site: Secondary | ICD-10-CM | POA: Diagnosis not present

## 2020-11-02 DIAGNOSIS — R69 Illness, unspecified: Secondary | ICD-10-CM | POA: Diagnosis not present

## 2020-11-02 DIAGNOSIS — G43009 Migraine without aura, not intractable, without status migrainosus: Secondary | ICD-10-CM | POA: Diagnosis not present

## 2020-11-02 DIAGNOSIS — K219 Gastro-esophageal reflux disease without esophagitis: Secondary | ICD-10-CM | POA: Diagnosis not present

## 2020-11-02 DIAGNOSIS — I10 Essential (primary) hypertension: Secondary | ICD-10-CM | POA: Diagnosis not present

## 2020-11-02 DIAGNOSIS — E785 Hyperlipidemia, unspecified: Secondary | ICD-10-CM | POA: Diagnosis not present

## 2020-11-02 DIAGNOSIS — G4700 Insomnia, unspecified: Secondary | ICD-10-CM | POA: Diagnosis not present

## 2020-11-02 DIAGNOSIS — G8929 Other chronic pain: Secondary | ICD-10-CM | POA: Diagnosis not present

## 2020-11-03 DIAGNOSIS — H401122 Primary open-angle glaucoma, left eye, moderate stage: Secondary | ICD-10-CM | POA: Diagnosis not present

## 2020-11-18 ENCOUNTER — Telehealth: Payer: Self-pay | Admitting: Emergency Medicine

## 2020-11-18 DIAGNOSIS — I129 Hypertensive chronic kidney disease with stage 1 through stage 4 chronic kidney disease, or unspecified chronic kidney disease: Secondary | ICD-10-CM | POA: Diagnosis not present

## 2020-11-18 DIAGNOSIS — M810 Age-related osteoporosis without current pathological fracture: Secondary | ICD-10-CM | POA: Diagnosis not present

## 2020-11-18 DIAGNOSIS — E785 Hyperlipidemia, unspecified: Secondary | ICD-10-CM | POA: Diagnosis not present

## 2020-11-18 DIAGNOSIS — G8929 Other chronic pain: Secondary | ICD-10-CM | POA: Diagnosis not present

## 2020-11-18 DIAGNOSIS — J849 Interstitial pulmonary disease, unspecified: Secondary | ICD-10-CM | POA: Diagnosis not present

## 2020-11-18 DIAGNOSIS — I10 Essential (primary) hypertension: Secondary | ICD-10-CM | POA: Diagnosis not present

## 2020-11-18 DIAGNOSIS — H409 Unspecified glaucoma: Secondary | ICD-10-CM | POA: Diagnosis not present

## 2020-11-18 DIAGNOSIS — R42 Dizziness and giddiness: Secondary | ICD-10-CM | POA: Diagnosis not present

## 2020-11-18 DIAGNOSIS — N183 Chronic kidney disease, stage 3 unspecified: Secondary | ICD-10-CM | POA: Diagnosis not present

## 2020-11-23 ENCOUNTER — Ambulatory Visit: Payer: Medicare HMO | Admitting: Emergency Medicine

## 2020-11-23 ENCOUNTER — Other Ambulatory Visit: Payer: Self-pay

## 2020-11-23 ENCOUNTER — Encounter: Payer: Self-pay | Admitting: Emergency Medicine

## 2020-11-23 VITALS — BP 138/90 | HR 78 | Temp 98.9°F | Ht 60.0 in | Wt 134.2 lb

## 2020-11-23 DIAGNOSIS — J849 Interstitial pulmonary disease, unspecified: Secondary | ICD-10-CM | POA: Diagnosis not present

## 2020-11-23 DIAGNOSIS — J4520 Mild intermittent asthma, uncomplicated: Secondary | ICD-10-CM

## 2020-11-23 MED ORDER — FLUTICASONE FUROATE-VILANTEROL 100-25 MCG/INH IN AEPB
1.0000 | INHALATION_SPRAY | Freq: Every day | RESPIRATORY_TRACT | 0 refills | Status: DC
Start: 2020-11-23 — End: 2020-12-06

## 2020-11-23 NOTE — Patient Instructions (Addendum)
We will try starting Breo 100, 1 inhalation once daily.  Rinse and gargle after you use this medicine.  Keep track of whether it helps your overall breathing.  If so then let us know so that we can send a prescription to your pharmacy. Please keep your albuterol available to use 2 puffs if needed for shortness of breath, chest tightness, wheezing. There is minimal interval change on your repeat CT scan of the chest.  This is good news.  We will plan to repeat your high-resolution CT in 1 year, March 2023. Follow with Dr. Lamonte Sakai in 2 months or sooner if you have any problems.

## 2020-11-23 NOTE — Assessment & Plan Note (Signed)
Little if any change on repeat CT chest in March 2022, 1 year interval.  She does have a clinical history that would be consistent with possible inflammatory arthritis although her serologies and also her visit with rheumatology were both reassuring.  She has dyspnea but in absence of significant change suspect that this is her obstructive lung disease and not her ILD.  We will continue to follow, plan to repeat her CT chest in March 2023, likely repeat PFT as well.  If evolving then she needs to see our ILD clinic.

## 2020-11-23 NOTE — Assessment & Plan Note (Signed)
Suspect her current dyspnea is at least in part related to deconditioning but she has had some benefit with bronchodilator, question whether her asthma is more active.  We will try starting Breo to see if she gets clinical benefit.  If so then we will continue.  She will call to let us know.  We will follow-up in 2 months

## 2020-11-23 NOTE — Progress Notes (Signed)
   Subjective:    Patient ID: Anna Arnold, female    DOB: [birth year], 70 y.o.   MRN: [id number]  HPI  ROV 11/23/20 --pleasant 70 year old woman with a history of spondylosis and DJD, possible autoimmune disease although serologies negative in April 2021 (except for weakly positive ANA).  She has mild obstruction on pulmonary function testing 08/2019. She reports that she has noticed more exertional SOB, she can hear some wheeze. Usually after sneezing. Has been seen by Dr Jacelyn Grip for some light-headedness. She just started using albuterol prn - does believe that it may help her some. Has had some morning awakenings w SOB. This seems to be worse with the hot weather.   Repeat CT scan of the chest, high-resolution 07/13/2020 reviewed by me, shows very mild peripheral predominant groundglass with some septal thickening, base predominant, with some associated mild cylindrical bronchiectatic change especially in the right middle lobe and the lingula.  No frank honeycomb change, question some subtle progression compared with 07/2019.  There is a large hiatal hernia   Review of Systems As per HPI     Objective:   Physical Exam Vitals:   11/23/20 1211  BP: 138/90  Pulse: 78  Temp: 98.9 F (37.2 C)  TempSrc: Temporal  SpO2: 98%  Weight: 134 lb 3.2 oz (60.9 kg)  Height: 5' (1.524 m)   Gen: Pleasant, well-nourished, in no distress,  normal affect  ENT: No lesions,  mouth clear,  oropharynx clear, no postnasal drip  Neck: No JVD, no stridor  Lungs: No use of accessory muscles, no crackles or wheezing on normal respiration, no wheeze on forced expiration  Cardiovascular: RRR, heart sounds normal, no murmur or gallops, no peripheral edema  Musculoskeletal: stable deformity of PIP joints #2 and 3 on R, #2 on L, no cyanosis or clubbing  Neuro: alert, awake, non focal  Skin: Warm, no lesions or rash      Assessment & Plan:  ILD (interstitial lung disease) ([place]) Little if any change on  repeat CT chest in March 2022, 1 year interval.  She does have a clinical history that would be consistent with possible inflammatory arthritis although her serologies and also her visit with rheumatology were both reassuring.  She has dyspnea but in absence of significant change suspect that this is her obstructive lung disease and not her ILD.  We will continue to follow, plan to repeat her CT chest in March 2023, likely repeat PFT as well.  If evolving then she needs to see our ILD clinic.  Asthma Suspect her current dyspnea is at least in part related to deconditioning but she has had some benefit with bronchodilator, question whether her asthma is more active.  We will try starting Breo to see if she gets clinical benefit.  If so then we will continue.  She will call to let us know.  We will follow-up in 2 months  Time spent with patient, reviewing chart and data, 35 minutes   Baltazar Apo, MD, PhD 11/23/2020, 2:50 PM [facility] [phone] or if no answer [phone]

## 2020-11-24 NOTE — Telephone Encounter (Signed)
Nothing noted in message. Will close encounter.  

## 2020-12-01 DIAGNOSIS — Z20822 Contact with and (suspected) exposure to covid-19: Secondary | ICD-10-CM | POA: Diagnosis not present

## 2020-12-02 DIAGNOSIS — U071 COVID-19: Secondary | ICD-10-CM | POA: Diagnosis not present

## 2020-12-06 ENCOUNTER — Telehealth: Payer: Self-pay | Admitting: Emergency Medicine

## 2020-12-06 MED ORDER — FLUTICASONE FUROATE-VILANTEROL 100-25 MCG/INH IN AEPB: 1.0000 | INHALATION_SPRAY | Freq: Every day | RESPIRATORY_TRACT | 0 refills | Status: AC

## 2020-12-06 NOTE — Telephone Encounter (Signed)
Call made to patient, confirmed DOB. Patient states the breo is working great. She reports she caught covid about 2 weeks ago because she went to [place] for a funeral. She reports her quarantine period is over and she feels much better. Confirmed pharmacy. Breo inhaler sent.   Nothing further needed at this time.

## 2020-12-15 DIAGNOSIS — G8929 Other chronic pain: Secondary | ICD-10-CM | POA: Diagnosis not present

## 2020-12-15 DIAGNOSIS — M810 Age-related osteoporosis without current pathological fracture: Secondary | ICD-10-CM | POA: Diagnosis not present

## 2020-12-15 DIAGNOSIS — M5030 Other cervical disc degeneration, unspecified cervical region: Secondary | ICD-10-CM | POA: Diagnosis not present

## 2020-12-15 DIAGNOSIS — N183 Chronic kidney disease, stage 3 unspecified: Secondary | ICD-10-CM | POA: Diagnosis not present

## 2020-12-15 DIAGNOSIS — I129 Hypertensive chronic kidney disease with stage 1 through stage 4 chronic kidney disease, or unspecified chronic kidney disease: Secondary | ICD-10-CM | POA: Diagnosis not present

## 2020-12-15 DIAGNOSIS — R42 Dizziness and giddiness: Secondary | ICD-10-CM | POA: Diagnosis not present

## 2020-12-15 DIAGNOSIS — H409 Unspecified glaucoma: Secondary | ICD-10-CM | POA: Diagnosis not present

## 2020-12-15 DIAGNOSIS — J849 Interstitial pulmonary disease, unspecified: Secondary | ICD-10-CM | POA: Diagnosis not present

## 2020-12-15 DIAGNOSIS — I10 Essential (primary) hypertension: Secondary | ICD-10-CM | POA: Diagnosis not present

## 2020-12-15 DIAGNOSIS — E785 Hyperlipidemia, unspecified: Secondary | ICD-10-CM | POA: Diagnosis not present

## 2020-12-16 ENCOUNTER — Other Ambulatory Visit: Payer: Self-pay | Admitting: Family Medicine

## 2020-12-16 DIAGNOSIS — M542 Cervicalgia: Secondary | ICD-10-CM | POA: Diagnosis not present

## 2020-12-16 DIAGNOSIS — M4802 Spinal stenosis, cervical region: Secondary | ICD-10-CM | POA: Diagnosis not present

## 2020-12-16 DIAGNOSIS — Z1231 Encounter for screening mammogram for malignant neoplasm of breast: Secondary | ICD-10-CM

## 2020-12-16 DIAGNOSIS — M50021 Cervical disc disorder at C4-C5 level with myelopathy: Secondary | ICD-10-CM | POA: Diagnosis not present

## 2020-12-16 DIAGNOSIS — M4312 Spondylolisthesis, cervical region: Secondary | ICD-10-CM | POA: Diagnosis not present

## 2020-12-22 DIAGNOSIS — I129 Hypertensive chronic kidney disease with stage 1 through stage 4 chronic kidney disease, or unspecified chronic kidney disease: Secondary | ICD-10-CM | POA: Diagnosis not present

## 2020-12-22 DIAGNOSIS — M5030 Other cervical disc degeneration, unspecified cervical region: Secondary | ICD-10-CM | POA: Diagnosis not present

## 2020-12-22 DIAGNOSIS — J849 Interstitial pulmonary disease, unspecified: Secondary | ICD-10-CM | POA: Diagnosis not present

## 2020-12-22 DIAGNOSIS — H409 Unspecified glaucoma: Secondary | ICD-10-CM | POA: Diagnosis not present

## 2020-12-22 DIAGNOSIS — R42 Dizziness and giddiness: Secondary | ICD-10-CM | POA: Diagnosis not present

## 2020-12-22 DIAGNOSIS — I10 Essential (primary) hypertension: Secondary | ICD-10-CM | POA: Diagnosis not present

## 2020-12-22 DIAGNOSIS — G8929 Other chronic pain: Secondary | ICD-10-CM | POA: Diagnosis not present

## 2020-12-22 DIAGNOSIS — E785 Hyperlipidemia, unspecified: Secondary | ICD-10-CM | POA: Diagnosis not present

## 2020-12-22 DIAGNOSIS — N183 Chronic kidney disease, stage 3 unspecified: Secondary | ICD-10-CM | POA: Diagnosis not present

## 2020-12-22 DIAGNOSIS — G4733 Obstructive sleep apnea (adult) (pediatric): Secondary | ICD-10-CM | POA: Diagnosis not present

## 2021-01-02 ENCOUNTER — Ambulatory Visit
Admission: RE | Admit: 2021-01-02 | Discharge: 2021-01-02 | Disposition: A | Discharge status: Home / Self Care | Payer: Medicare HMO | Source: Ambulatory Visit | Attending: Family Medicine | Admitting: Family Medicine

## 2021-01-02 ENCOUNTER — Other Ambulatory Visit: Payer: Self-pay

## 2021-01-02 DIAGNOSIS — Z1231 Encounter for screening mammogram for malignant neoplasm of breast: Secondary | ICD-10-CM

## 2021-01-04 ENCOUNTER — Telehealth: Payer: Self-pay | Admitting: Emergency Medicine

## 2021-01-04 DIAGNOSIS — H01114 Allergic dermatitis of left upper eyelid: Secondary | ICD-10-CM | POA: Diagnosis not present

## 2021-01-04 DIAGNOSIS — H01111 Allergic dermatitis of right upper eyelid: Secondary | ICD-10-CM | POA: Diagnosis not present

## 2021-01-04 DIAGNOSIS — H401122 Primary open-angle glaucoma, left eye, moderate stage: Secondary | ICD-10-CM | POA: Diagnosis not present

## 2021-01-04 DIAGNOSIS — H401113 Primary open-angle glaucoma, right eye, severe stage: Secondary | ICD-10-CM | POA: Diagnosis not present

## 2021-01-04 DIAGNOSIS — H01112 Allergic dermatitis of right lower eyelid: Secondary | ICD-10-CM | POA: Diagnosis not present

## 2021-01-04 DIAGNOSIS — H01115 Allergic dermatitis of left lower eyelid: Secondary | ICD-10-CM | POA: Diagnosis not present

## 2021-01-04 DIAGNOSIS — H35371 Puckering of macula, right eye: Secondary | ICD-10-CM | POA: Diagnosis not present

## 2021-01-04 NOTE — Telephone Encounter (Signed)
Pt stated that although she has 3 puffs left of her Breo Inhaler she does not believe that it has been helping her pt stated that she is still very SOB and she states that she does have her rescue Albuterol inhaler but she does not use it and states that she does not believe it is not helping her much and wanted to know if she can possibly try something different. Pt stated that she is also trying to go to [place]. [place] this weekend but she is also concerned about the altitude and her trouble breathing not improving and wanted to see what Dr. Lamonte Sakai has to say for reqs.   Pt also stated she has glaucoma and that she read that Memory Dance is not recommended for pts with glaucoma and osteoporosis and she has both and she also mentioned that her eye doctor will be performing a surgery on her eye because it has been worsening stated the pressures in her eyes are too high so she was wanting to address those concerns with Dr. Lamonte Sakai.  Pharmacy; [facility] [id number] - [address], [address] [place] [address]  Pls regard; [phone]

## 2021-01-04 NOTE — Telephone Encounter (Signed)
Call made to patient, confirmed DOB. Made aware of RB recommendations. Voiced understanding. She states she thinks the humidity is the issue stating she does not have this problem in the house. She reports having a sense of heaviness on her chest when outside in the heat and humidity, pt wanted to be sure to make RB aware. She is going to stop the [facility] and use the albuterol until her f/u appt.   Nothing further needed at this time.

## 2021-01-04 NOTE — Telephone Encounter (Signed)
Called and spoke with pt who states that she does not believe the breo has been helping with her breathing. Also states that she has glaucoma and was told recently that the pressures in her eyes have been going up and is not sure if it is due to the breo but said that is the newest thing she has been started on.  Pt also said that she is trying to go to [place]. [place] over the weekend and is concerned about the altitude with her SOB and wants to see if there is anything that Dr. Lamonte Sakai can recommend. Pt said that she is not on any O2.  Dr. Lamonte Sakai, please advise.

## 2021-01-04 NOTE — Telephone Encounter (Signed)
Please reassure her that Memory Dance should not affect her glaucoma  Since she has not responded to the [facility] I think she should just stop it.  Not clear to me that her shortness of breath is responsive to bronchodilator, but it would be beneficial for her to at least try the albuterol to see if she gets anything out of it.  Please set her up to see APP or RB to evaluate further

## 2021-01-12 DIAGNOSIS — Z1389 Encounter for screening for other disorder: Secondary | ICD-10-CM | POA: Diagnosis not present

## 2021-01-12 DIAGNOSIS — Z0000 Encounter for general adult medical examination without abnormal findings: Secondary | ICD-10-CM | POA: Diagnosis not present

## 2021-01-18 DIAGNOSIS — H6123 Impacted cerumen, bilateral: Secondary | ICD-10-CM | POA: Diagnosis not present

## 2021-01-18 DIAGNOSIS — H6983 Other specified disorders of Eustachian tube, bilateral: Secondary | ICD-10-CM | POA: Diagnosis not present

## 2021-01-18 DIAGNOSIS — J310 Chronic rhinitis: Secondary | ICD-10-CM | POA: Diagnosis not present

## 2021-01-18 DIAGNOSIS — J342 Deviated nasal septum: Secondary | ICD-10-CM | POA: Diagnosis not present

## 2021-01-19 ENCOUNTER — Encounter: Payer: Self-pay | Admitting: Emergency Medicine

## 2021-01-19 ENCOUNTER — Ambulatory Visit: Payer: Medicare HMO | Admitting: Emergency Medicine

## 2021-01-19 ENCOUNTER — Other Ambulatory Visit: Payer: Self-pay

## 2021-01-19 DIAGNOSIS — J4520 Mild intermittent asthma, uncomplicated: Secondary | ICD-10-CM

## 2021-01-19 DIAGNOSIS — J849 Interstitial pulmonary disease, unspecified: Secondary | ICD-10-CM

## 2021-01-19 MED ORDER — ALBUTEROL SULFATE HFA 108 (90 BASE) MCG/ACT IN AERS: 2.0000 | INHALATION_SPRAY | Freq: Four times a day (QID) | RESPIRATORY_TRACT | 5 refills | Status: DC | PRN

## 2021-01-19 NOTE — Assessment & Plan Note (Signed)
Subtle peripheral ILD, etiology unclear, question autoimmune although rheumatological evaluation has been reassuring so far.  No overt honeycomb change.  Question whether some of this is GERD, microaspiration.  Planning to repeat her CT chest in March to evaluate.  If there is progression of ILD then I think she needs to go to our ILD clinic.

## 2021-01-19 NOTE — Patient Instructions (Addendum)
We will not start a scheduled inhaler at this time Keep albuterol available to use 2 puffs up to every 4 hours if needed for shortness of breath, chest tightness, wheezing.  We will refill this for you today We will plan to repeat your high-resolution CT chest in March 2023 Follow with Dr Lamonte Sakai in March after your CT scan so that we can review the results together.

## 2021-01-19 NOTE — Progress Notes (Addendum)
Subjective:    Patient ID: Anna Arnold, female    DOB: 1950/08/14, 70 y.o.   MRN: [id number]  HPI  ROV 11/23/20 --pleasant 70 year old woman with a history of spondylosis and DJD, possible autoimmune disease although serologies negative in April 2021 (except for weakly positive ANA).  She has mild obstruction on pulmonary function testing 08/2019. She reports that she has noticed more exertional SOB, she can hear some wheeze. Usually after sneezing. Has been seen by Dr Jacelyn Grip for some light-headedness. She just started using albuterol prn - does believe that it may help her some. Has had some morning awakenings w SOB. This seems to be worse with the hot weather.   Repeat CT scan of the chest, high-resolution 07/13/2020 reviewed by me, shows very mild peripheral predominant groundglass with some septal thickening, base predominant, with some associated mild cylindrical bronchiectatic change especially in the right middle lobe and the lingula.  No frank honeycomb change, question some subtle progression compared with 07/2019.  There is a large hiatal hernia  ROV 01/19/21 --70 year old woman, history of spondylosis and DJD, question autoimmune disease although rheumatological evaluation has been reassuring.  She has mild obstruction on PFT, suspected mixed disease with some very mild peripheral septal thickening and associated inflammatory change without frank honeycomb.  At our last visit based on her mild obstruction we did a trial of Breo to see if she would get benefit.  Initially she thought it was helping but in the end it really did not change her breathing significantly so we stopped it. She also had some UA irritation, difficulty swallowing. Made her GERD worse, even on PPI.  Reports that she had COVID-19 in July 2022, fatigue, myalgias and weakness or her symptoms.  No real cough or URI symptoms.  She has been dealing with ophthalmology for glaucoma.  She has albuterol but has only ever used it  once. She believes the high humidity makes her breathing more difficult. She is not getting a lot of exercise these days due to knee and back pain.    Review of Systems As per HPI     Objective:   Physical Exam Vitals:   01/19/21 0909  BP: 136/80  Pulse: 77  Temp: 98.5 F (36.9 C)  TempSrc: Oral  SpO2: 97%  Weight: 134 lb 9.6 oz (61.1 kg)  Height: 5' (1.524 m)   Gen: Pleasant, well-nourished, in no distress,  normal affect  ENT: No lesions,  mouth clear,  oropharynx clear, no postnasal drip  Neck: No JVD, no stridor  Lungs: No use of accessory muscles, no crackles or wheezing on normal respiration, no wheeze on forced expiration  Cardiovascular: RRR, heart sounds normal, no murmur or gallops, no peripheral edema  Musculoskeletal: stable deformity of PIP joints #2 and 3 on R, #2 on L, no cyanosis or clubbing  Neuro: alert, awake, non focal  Skin: Warm, no lesions or rash      Assessment & Plan:  Asthma Mild obstructive disease.  Did not significantly benefit from [facility].  She still has albuterol that she can use as needed.  I think she needs to increase her exercise and conditioning as well.  ILD (interstitial lung disease) ([place]) Subtle peripheral ILD, etiology unclear, question autoimmune although rheumatological evaluation has been reassuring so far.  No overt honeycomb change.  Question whether some of this is GERD, microaspiration.  Planning to repeat her CT chest in March to evaluate.  If there is progression of ILD then I think  she needs to go to our ILD clinic.  Time spent with patient, reviewing chart and data, 35 minutes   Baltazar Apo, MD, PhD 01/19/2021, 9:33 AM [facility] [phone] or if no answer [phone]    ADDENDUM  Patient's chart and risks reviewed as she is being evaluated for C4-5 Anterior cervical fusion. Her mild asthma has been well compensated. She does also have some evidence for mild ILD. She is at mildly  increased risk for her surgical procedure, but no contraindications to proceed as long as her asthma remains well compensated. If she were have asthma flaring then her procedure should be postponed.    Baltazar Apo, MD, PhD 02/16/2021, 1:23 PM [facility] [phone] or if no answer before 7:00PM call [phone] For any issues after 7:00PM please call eLink [phone]

## 2021-01-19 NOTE — Assessment & Plan Note (Signed)
Mild obstructive disease.  Did not significantly benefit from [facility].  She still has albuterol that she can use as needed.  I think she needs to increase her exercise and conditioning as well.

## 2021-01-24 DIAGNOSIS — J45909 Unspecified asthma, uncomplicated: Secondary | ICD-10-CM | POA: Diagnosis not present

## 2021-01-24 DIAGNOSIS — H401122 Primary open-angle glaucoma, left eye, moderate stage: Secondary | ICD-10-CM | POA: Diagnosis not present

## 2021-01-24 DIAGNOSIS — K219 Gastro-esophageal reflux disease without esophagitis: Secondary | ICD-10-CM | POA: Diagnosis not present

## 2021-01-24 DIAGNOSIS — G4733 Obstructive sleep apnea (adult) (pediatric): Secondary | ICD-10-CM | POA: Diagnosis not present

## 2021-01-24 DIAGNOSIS — J849 Interstitial pulmonary disease, unspecified: Secondary | ICD-10-CM | POA: Diagnosis not present

## 2021-01-24 DIAGNOSIS — Z880 Allergy status to penicillin: Secondary | ICD-10-CM | POA: Diagnosis not present

## 2021-01-24 DIAGNOSIS — Z881 Allergy status to other antibiotic agents status: Secondary | ICD-10-CM | POA: Diagnosis not present

## 2021-01-24 DIAGNOSIS — R69 Illness, unspecified: Secondary | ICD-10-CM | POA: Diagnosis not present

## 2021-01-24 DIAGNOSIS — Z79899 Other long term (current) drug therapy: Secondary | ICD-10-CM | POA: Diagnosis not present

## 2021-01-24 DIAGNOSIS — I10 Essential (primary) hypertension: Secondary | ICD-10-CM | POA: Diagnosis not present

## 2021-01-24 DIAGNOSIS — Z7983 Long term (current) use of bisphosphonates: Secondary | ICD-10-CM | POA: Diagnosis not present

## 2021-01-24 DIAGNOSIS — Z888 Allergy status to other drugs, medicaments and biological substances status: Secondary | ICD-10-CM | POA: Diagnosis not present

## 2021-01-24 DIAGNOSIS — E785 Hyperlipidemia, unspecified: Secondary | ICD-10-CM | POA: Diagnosis not present

## 2021-01-24 DIAGNOSIS — E7800 Pure hypercholesterolemia, unspecified: Secondary | ICD-10-CM | POA: Diagnosis not present

## 2021-01-25 ENCOUNTER — Ambulatory Visit: Payer: Medicare HMO | Admitting: Emergency Medicine

## 2021-02-01 DIAGNOSIS — G43009 Migraine without aura, not intractable, without status migrainosus: Secondary | ICD-10-CM | POA: Diagnosis not present

## 2021-02-01 DIAGNOSIS — N183 Chronic kidney disease, stage 3 unspecified: Secondary | ICD-10-CM | POA: Diagnosis not present

## 2021-02-01 DIAGNOSIS — E7800 Pure hypercholesterolemia, unspecified: Secondary | ICD-10-CM | POA: Diagnosis not present

## 2021-02-01 DIAGNOSIS — G8929 Other chronic pain: Secondary | ICD-10-CM | POA: Diagnosis not present

## 2021-02-01 DIAGNOSIS — J4520 Mild intermittent asthma, uncomplicated: Secondary | ICD-10-CM | POA: Diagnosis not present

## 2021-02-01 DIAGNOSIS — E785 Hyperlipidemia, unspecified: Secondary | ICD-10-CM | POA: Diagnosis not present

## 2021-02-01 DIAGNOSIS — R69 Illness, unspecified: Secondary | ICD-10-CM | POA: Diagnosis not present

## 2021-02-01 DIAGNOSIS — I129 Hypertensive chronic kidney disease with stage 1 through stage 4 chronic kidney disease, or unspecified chronic kidney disease: Secondary | ICD-10-CM | POA: Diagnosis not present

## 2021-02-01 DIAGNOSIS — I10 Essential (primary) hypertension: Secondary | ICD-10-CM | POA: Diagnosis not present

## 2021-02-01 DIAGNOSIS — G4700 Insomnia, unspecified: Secondary | ICD-10-CM | POA: Diagnosis not present

## 2021-02-09 ENCOUNTER — Telehealth: Payer: Self-pay | Admitting: *Deleted

## 2021-02-09 NOTE — Telephone Encounter (Signed)
   [facility] Pre-operative Risk Assessment    Patient Name: Anna Arnold  DOB: January 16, 1951 MRN: [id number]  HEARTCARE STAFF:  - IMPORTANT!!!!!! Under Visit Info/Reason for Call, type in Other and utilize the format Clearance MM/DD/YY or Clearance TBD. Do not use dashes or single digits. - Please review there is not already an duplicate clearance open for this procedure. - If request is for dental extraction, please clarify the # of teeth to be extracted. - If the patient is currently at the dentist's office, call Pre-Op Callback Staff (MA/nurse) to input urgent request.  - If the patient is not currently in the dentist office, please route to the Pre-Op pool.  Request for surgical clearance:  What type of surgery is being performed? C4-5 ANTERIOR CERVICAL FUSION  When is this surgery scheduled? 04/24/21  What type of clearance is required (medical clearance vs. Pharmacy clearance to hold med vs. Both)? MEDICAL  Are there any medications that need to be held prior to surgery and how long? NONE  Practice name and name of physician performing surgery? [facility] Earnie Larsson  What is the office phone number? [phone]   7.   What is the office fax number? [phone]  8.   Anesthesia type (None, local, MAC, general) ? GENERAL   Devra Dopp 02/09/2021, 4:06 PM  _________________________________________________________________   (provider comments below)

## 2021-02-10 NOTE — Telephone Encounter (Signed)
   Patient Name: Anna Arnold  DOB: 1950-08-09 MRN: [id number]  Primary Cardiologist: None  Chart reviewed as part of pre-operative protocol coverage for requested procedure as below on 04/24/2021.   Patient was last seen in the office 03/15/2020.  He is already scheduled for reassessment 04/03/2021.   I will forward to Dr. Gardiner Rhyme as an [place] only.   Arvil Chaco, PA-C 02/10/2021, 5:07 PM

## 2021-02-15 ENCOUNTER — Telehealth: Payer: Self-pay | Admitting: Emergency Medicine

## 2021-02-15 NOTE — Telephone Encounter (Signed)
Fax received from Dr. Cooper Render Pool to perform a C4- C5 Anterior Cervical Fusion under general anesthesia on patient.  Patient needs surgery clearance. Patient was just seen 01/19/2021 by Dr. Lamonte Sakai. Office protocol is a risk assessment can be sent to surgeon if patient has been seen in 60 days or less.   Sending to Dr. Lamonte Sakai for risk assessment or recommendations if patient needs to be seen in office prior to surgical procedure.

## 2021-02-16 NOTE — Telephone Encounter (Signed)
I wrote an addendum to the recent note. She is at mildly increased risk, no contraindications to proceeding.

## 2021-02-16 NOTE — Telephone Encounter (Signed)
I have faxed over the last OV note with the addendum from [place].  Nothing further is needed.

## 2021-03-01 DIAGNOSIS — H401122 Primary open-angle glaucoma, left eye, moderate stage: Secondary | ICD-10-CM | POA: Diagnosis not present

## 2021-03-09 DIAGNOSIS — M4312 Spondylolisthesis, cervical region: Secondary | ICD-10-CM | POA: Diagnosis not present

## 2021-03-13 DIAGNOSIS — M25562 Pain in left knee: Secondary | ICD-10-CM | POA: Diagnosis not present

## 2021-03-23 DIAGNOSIS — M1712 Unilateral primary osteoarthritis, left knee: Secondary | ICD-10-CM | POA: Diagnosis not present

## 2021-03-23 DIAGNOSIS — M25552 Pain in left hip: Secondary | ICD-10-CM | POA: Diagnosis not present

## 2021-03-27 DIAGNOSIS — D225 Melanocytic nevi of trunk: Secondary | ICD-10-CM | POA: Diagnosis not present

## 2021-03-27 DIAGNOSIS — Z1283 Encounter for screening for malignant neoplasm of skin: Secondary | ICD-10-CM | POA: Diagnosis not present

## 2021-03-29 DIAGNOSIS — E785 Hyperlipidemia, unspecified: Secondary | ICD-10-CM | POA: Diagnosis not present

## 2021-03-29 DIAGNOSIS — J849 Interstitial pulmonary disease, unspecified: Secondary | ICD-10-CM | POA: Diagnosis not present

## 2021-04-02 NOTE — Progress Notes (Signed)
Cardiology Office Note:    Date:  04/04/2021   ID:  Anna Arnold, DOB 11-17-1950, MRN [id number]  PCP:  Vernie Shanks, MD  Cardiologist:  None  Electrophysiologist:  None   Referring MD: Vernie Shanks, MD   Chief Complaint  Patient presents with   Pre-op Exam     History of Present Illness:    Anna Arnold is a 70 y.o. female with a hx of fibromyalgia, hyperlipidemia who presents for follow-up.  She was referred by Dr. Jacelyn Grip for evaluation of palpitations, initially seen on 12/09/2019.  She reports that her palpitations started 6 weeks prior.  Occurs 2-3 times per week, feels like her heart is racing.  Will last for 2 minutes or so and resolve.  Always occurs at night, especially when she is lying on her left side.  She denies any lightheadedness or syncope during episodes, but does report she has lightheadedness sometimes during the day.  Also reports she has been having some swelling in her left ankle.  In addition, she states that she had some chest pain one week prior.  Describes as left-sided dull aching pain.  Lasted for 1 to 1.5 hours.  For exercise she goes for walks.  Typically does about 10,000 steps per day between walking and her job working at a preschool.  Reports she has noted progressive fatigue and shortness of breath with exertion.  No smoking history.  Family history includes father died of MI at age 29, though she thinks he had a stent at some point prior to this.  Her oldest brother died of MI at age 64.  She is taking atorvastatin 10 mg twice weekly, has been unable to tolerate more frequent dosing.   Lexiscan Myoview on 12/16/2019 showed normal perfusion, EF 58%.  Echocardiogram on 12/28/2019 showed normal biventricular function, no significant valvular disease.  Zio patch x14 days showed one 10 beat run of NSVT, otherwise no significant abnormalities.  Calcium score 0 on 03/21/2020.  Since last clinic visit, she reports that she has been okay.  Planning cervical  spine surgery 11/28.  Denies any chest pain, dyspnea, lightheadedness, syncope.  Does report occasional palpitations that last for few seconds and resolves.  She has not been exercising.  Most exertion she does is walking around stores.  She can walk up a flight of stairs without stopping.  Reports occasional lower extremity edema.  Brought home BP log, has been 110s to 140s over 70s to 80s.  Past Medical History:  Diagnosis Date   Anxiety    Arthritis    Cataracts, bilateral    Cervical spondylosis    Degenerative disc disease, lumbar    Depression    Fibromyalgia    GERD (gastroesophageal reflux disease)    Glaucoma    Hypertension    Migraines    Osteopenia    Seasonal allergies    Sleep disorder     Past Surgical History:  Procedure Laterality Date   bunionectomy Right 2008   CATARACT EXTRACTION Bilateral 2015-2016   CESAREAN SECTION  1982   GLAUCOMA SURGERY Bilateral 2013   HAMMER TOE SURGERY Right 2008   ROTATOR CUFF REPAIR Right 2012    Current Medications: Current Meds  Medication Sig   acetaminophen (TYLENOL) 500 MG tablet Take 500 mg by mouth every 6 (six) hours as needed.   albuterol (VENTOLIN HFA) 108 (90 Base) MCG/ACT inhaler Inhale 2 puffs into the lungs every 6 (six) hours as needed for wheezing or  shortness of breath.   alendronate (FOSAMAX) 70 MG tablet Take 70 mg by mouth once a week. Take with a full glass of water on an empty stomach.   atorvastatin (LIPITOR) 10 MG tablet Take 10 mg by mouth 3 (three) times a week.   buPROPion (WELLBUTRIN SR) 100 MG 12 hr tablet Take 100 mg by mouth daily.   Camphor-Menthol-Methyl Sal (SALONPAS) 3.06-02-08 % PTCH Apply 1 patch topically at bedtime as needed.   Carboxymethylcellulose Sodium (REFRESH PLUS OP)    Cholecalciferol (VITAMIN D3) 1000 units CAPS Take by mouth.   losartan (COZAAR) 50 MG tablet Take 50 mg by mouth daily.   Multiple Minerals-Vitamins (CALCIUM-MAGNESIUM-ZINC-D3 PO) Take by mouth.   Omega-3 Fatty Acids  (FISH OIL ULTRA) 1400 MG CAPS Take by mouth.   pantoprazole (PROTONIX) 40 MG tablet Take 40 mg by mouth daily.   SUMAtriptan (IMITREX) 100 MG tablet Take 100 mg by mouth every 2 (two) hours as needed for migraine. Take 1/2 tablet for migraine. May repeat in 2 hours if headache persists or recurs.   Current Facility-Administered Medications for the 04/03/21 encounter (Office Visit) with Donato Heinz, MD  Medication   0.9 %  sodium chloride infusion     Allergies:   Brimonidine, Netarsudil, Netarsudil dimesylate, Amitriptyline, Atenolol, Augmentin [amoxicillin-pot clavulanate], Biaxin [clarithromycin], Ceftin [cefuroxime axetil], Chlorthalidone, Citalopram hydrobromide, Erythromycin, Fluoxetine, Lexapro [escitalopram oxalate], Meloxicam, Potassium chloride, Pravastatin, Reglan [metoclopramide], Sertraline hcl, Simvastatin, Singulair [montelukast sodium], Travoprost, Viibryd [vilazodone hcl], Zegerid [employer] bicarbonate], Zegerid [omeprazole], and Brinzolamide   Social History   Socioeconomic History   Marital status: Married    Spouse name: Not on file   Number of children: Not on file   Years of education: Not on file   Highest education level: Not on file  Occupational History   Not on file  Tobacco Use   Smoking status: Never   Smokeless tobacco: Never  Substance and Sexual Activity   Alcohol use: No   Drug use: No   Sexual activity: Not on file  Other Topics Concern   Not on file  Social History Narrative   Not on file   Social Determinants of Health   Financial Resource Strain: Not on file  Food Insecurity: Not on file  Transportation Needs: Not on file  Physical Activity: Not on file  Stress: Not on file  Social Connections: Not on file     Family History: The patient'sfamily history includes Cancer in her mother; Depression in her brother and mother; Heart attack in her brother; Heart disease in her father. There is no history of Colon cancer,  Colon polyps, Esophageal cancer, Rectal cancer, or Stomach cancer.  ROS:   Please see the history of present illness.    All other systems reviewed and are negative.  EKGs/Labs/Other Studies Reviewed:    The following studies were reviewed today:  EKG:  EKG is ordered today.  The ekg ordered demonstrates normal sinus rhythm, incomplete right bundle branch block, rate 72  Recent Labs: No results found for requested labs within last 8760 hours.  Recent Lipid Panel No results found for: CHOL, TRIG, HDL, CHOLHDL, VLDL, LDLCALC, LDLDIRECT  Physical Exam:    VS:  BP (!) 148/82   Pulse 72   Ht 5' (1.524 m)   Wt 136 lb (61.7 kg)   SpO2 96%   BMI 26.56 kg/m     Wt Readings from Last 3 Encounters:  04/03/21 136 lb (61.7 kg)  01/19/21 134 lb 9.6 oz (61.1  kg)  11/23/20 134 lb 3.2 oz (60.9 kg)     GEN: Well nourished, well developed in no acute distress HEENT: Normal NECK: No JVD; No carotid bruits CARDIAC: RRR, no murmurs, rubs, gallops RESPIRATORY:  Clear to auscultation without rales, wheezing or rhonchi  ABDOMEN: Soft, non-tender, non-distended MUSCULOSKELETAL:  No edema; No deformity  SKIN: Warm and dry NEUROLOGIC:  Alert and oriented x 3 PSYCHIATRIC:  Normal affect   ASSESSMENT:    1. Pre-operative cardiovascular examination   2. Palpitations   3. Essential hypertension   4. Chest pain of uncertain etiology   5. Hyperlipidemia, unspecified hyperlipidemia type     PLAN:    Preop evaluation: Prior to cervical spinal surgery.  Denies any exertional chest pain or dyspnea.  Lexiscan Myoview 11/2019 showed normal perfusion.  Functional capacity greater than 4 METS.  RCRI score 0. -Would classify as low risk for an intermediate risk procedure.  No further cardiac work-up recommended prior to procedure  Palpitations: Zio patch x14 days showed one 10 beat run of NSVT, otherwise no significant abnormalities.  Reports palpitations have improved.  Chest pain/dyspnea on  exertion: Chest pain is atypical in description, but does have CAD risk factors (age, family history, hyperlipidemia).  In addition she reports progressive dyspnea on exertion, which could represent anginal equivalent.  Lexiscan Myoview on 12/16/2019 showed normal perfusion, EF 58%.  Echocardiogram on 12/28/2019 showed normal biventricular function, no significant valvular disease.  Calcium score 0 on 03/21/2020. Reports chest pain has resolved, no further cardiac work-up recommended. -Chest CT consistent with ILD, follows with pulmonology  Hyperlipidemia: On atorvastatin 10 mg three times per week.  LDL 111 on 11/06/2019.  Calcium score 0 on 03/21/2020.  Hypertension: On losartan 50 mg daily.  Appears controlled.  RTC in 1 year  Medication Adjustments/Labs and Tests Ordered: Current medicines are reviewed at length with the patient today.  Concerns regarding medicines are outlined above.  Orders Placed This Encounter  Procedures   EKG 12-Lead    No orders of the defined types were placed in this encounter.   Patient Instructions  Medication Instructions:  Your physician recommends that you continue on your current medications as directed. Please refer to the Current Medication list given to you today.  *If you need a refill on your cardiac medications before your next appointment, please call your pharmacy*  Follow-Up: At [facility], you and your health needs are our priority.  As part of our continuing mission to provide you with exceptional heart care, we have created designated Provider Care Teams.  These Care Teams include your primary Cardiologist (physician) and Advanced Practice Providers (APPs -  Physician Assistants and Nurse Practitioners) who all work together to provide you with the care you need, when you need it.  We recommend signing up for the patient portal called "MyChart".  Sign up information is provided on this After Visit Summary.  MyChart is used to connect with  patients for Virtual Visits (Telemedicine).  Patients are able to view lab/test results, encounter notes, upcoming appointments, etc.  Non-urgent messages can be sent to your provider as well.   To learn more about what you can do with MyChart, go to [email or web address].    Your next appointment:   12 month(s)  The format for your next appointment:   In Person  Provider:   Dr. Gardiner Rhyme      Signed, Donato Heinz, MD  04/04/2021 10:31 PM    [facility]

## 2021-04-03 ENCOUNTER — Ambulatory Visit: Payer: Medicare HMO | Admitting: Cardiology

## 2021-04-03 ENCOUNTER — Other Ambulatory Visit: Payer: Self-pay

## 2021-04-03 VITALS — BP 148/82 | HR 72 | Ht 60.0 in | Wt 136.0 lb

## 2021-04-03 DIAGNOSIS — E785 Hyperlipidemia, unspecified: Secondary | ICD-10-CM | POA: Diagnosis not present

## 2021-04-03 DIAGNOSIS — R002 Palpitations: Secondary | ICD-10-CM | POA: Diagnosis not present

## 2021-04-03 DIAGNOSIS — I10 Essential (primary) hypertension: Secondary | ICD-10-CM

## 2021-04-03 DIAGNOSIS — Z01810 Encounter for preprocedural cardiovascular examination: Secondary | ICD-10-CM

## 2021-04-03 DIAGNOSIS — R079 Chest pain, unspecified: Secondary | ICD-10-CM | POA: Diagnosis not present

## 2021-04-03 NOTE — Patient Instructions (Signed)
Medication Instructions:  Your physician recommends that you continue on your current medications as directed. Please refer to the Current Medication list given to you today.  *If you need a refill on your cardiac medications before your next appointment, please call your pharmacy*  Follow-Up: At [facility], you and your health needs are our priority.  As part of our continuing mission to provide you with exceptional heart care, we have created designated Provider Care Teams.  These Care Teams include your primary Cardiologist (physician) and Advanced Practice Providers (APPs -  Physician Assistants and Nurse Practitioners) who all work together to provide you with the care you need, when you need it.  We recommend signing up for the patient portal called "MyChart".  Sign up information is provided on this After Visit Summary.  MyChart is used to connect with patients for Virtual Visits (Telemedicine).  Patients are able to view lab/test results, encounter notes, upcoming appointments, etc.  Non-urgent messages can be sent to your provider as well.   To learn more about what you can do with MyChart, go to [email or web address].    Your next appointment:   12 month(s)  The format for your next appointment:   In Person  Provider:   Dr. Schumann    

## 2021-04-07 DIAGNOSIS — K219 Gastro-esophageal reflux disease without esophagitis: Secondary | ICD-10-CM | POA: Diagnosis not present

## 2021-04-07 DIAGNOSIS — M810 Age-related osteoporosis without current pathological fracture: Secondary | ICD-10-CM | POA: Diagnosis not present

## 2021-04-07 DIAGNOSIS — Z23 Encounter for immunization: Secondary | ICD-10-CM | POA: Diagnosis not present

## 2021-04-07 DIAGNOSIS — H409 Unspecified glaucoma: Secondary | ICD-10-CM | POA: Diagnosis not present

## 2021-04-07 DIAGNOSIS — E785 Hyperlipidemia, unspecified: Secondary | ICD-10-CM | POA: Diagnosis not present

## 2021-04-07 DIAGNOSIS — I10 Essential (primary) hypertension: Secondary | ICD-10-CM | POA: Diagnosis not present

## 2021-04-07 DIAGNOSIS — G8929 Other chronic pain: Secondary | ICD-10-CM | POA: Diagnosis not present

## 2021-04-07 DIAGNOSIS — G4700 Insomnia, unspecified: Secondary | ICD-10-CM | POA: Diagnosis not present

## 2021-04-07 DIAGNOSIS — G43009 Migraine without aura, not intractable, without status migrainosus: Secondary | ICD-10-CM | POA: Diagnosis not present

## 2021-04-11 DIAGNOSIS — M4310 Spondylolisthesis, site unspecified: Secondary | ICD-10-CM | POA: Diagnosis not present

## 2021-04-17 ENCOUNTER — Telehealth: Payer: Self-pay | Admitting: *Deleted

## 2021-04-17 NOTE — Telephone Encounter (Signed)
   [facility] Pre-operative Risk Assessment    Patient Name: Anna Arnold  DOB: 29-Apr-1951 MRN: [id number]  HEARTCARE STAFF:  - IMPORTANT!!!!!! Under Visit Info/Reason for Call, type in Other and utilize the format Clearance MM/DD/YY or Clearance TBD. Do not use dashes or single digits. - Please review there is not already an duplicate clearance open for this procedure. - If request is for dental extraction, please clarify the # of teeth to be extracted. - If the patient is currently at the dentist's office, call Pre-Op Callback Staff (MA/nurse) to input urgent request.  - If the patient is not currently in the dentist office, please route to the Pre-Op pool.  Request for surgical clearance:  What type of surgery is being performed? C4-5 anterior cervical fusion  When is this surgery scheduled? 04/24/21  What type of clearance is required (medical clearance vs. Pharmacy clearance to hold med vs. Both)? medical  Are there any medications that need to be held prior to surgery and how long? none  Practice name and name of physician performing surgery? [facility]  What is the office phone number? [phone]   7.   What is the office fax number? [phone]  8.   Anesthesia type (None, local, MAC, general) ? general   Fredia Beets 04/17/2021, 7:40 AM  _________________________________________________________________   (provider comments below)

## 2021-04-17 NOTE — Telephone Encounter (Signed)
    Patient Name: Anna Arnold  DOB: 08-04-1950 MRN: [id number]  Primary Cardiologist: Donato Heinz, MD  Chart reviewed as part of pre-operative protocol coverage.   Per Dr. Newman Nickels note 04/03/21 "Preop evaluation: Prior to cervical spinal surgery.  Denies any exertional chest pain or dyspnea.  Lexiscan Myoview 11/2019 showed normal perfusion.  Functional capacity greater than 4 METS.  RCRI score 0. -Would classify as low risk for an intermediate risk procedure.  No further cardiac work-up recommended prior to procedure".    I will route this recommendation to the requesting party via Epic fax function and remove from pre-op pool.  Please call with questions.  [place], [place] 04/17/2021, 8:33 AM

## 2021-04-24 DIAGNOSIS — M4312 Spondylolisthesis, cervical region: Secondary | ICD-10-CM | POA: Diagnosis not present

## 2021-04-24 DIAGNOSIS — M2578 Osteophyte, vertebrae: Secondary | ICD-10-CM | POA: Diagnosis not present

## 2021-04-24 DIAGNOSIS — M4802 Spinal stenosis, cervical region: Secondary | ICD-10-CM | POA: Diagnosis not present

## 2021-04-24 DIAGNOSIS — M4310 Spondylolisthesis, site unspecified: Secondary | ICD-10-CM | POA: Diagnosis not present

## 2021-05-24 DIAGNOSIS — J4520 Mild intermittent asthma, uncomplicated: Secondary | ICD-10-CM | POA: Diagnosis not present

## 2021-05-24 DIAGNOSIS — R69 Illness, unspecified: Secondary | ICD-10-CM | POA: Diagnosis not present

## 2021-05-24 DIAGNOSIS — I10 Essential (primary) hypertension: Secondary | ICD-10-CM | POA: Diagnosis not present

## 2021-05-24 DIAGNOSIS — K219 Gastro-esophageal reflux disease without esophagitis: Secondary | ICD-10-CM | POA: Diagnosis not present

## 2021-05-24 DIAGNOSIS — G8929 Other chronic pain: Secondary | ICD-10-CM | POA: Diagnosis not present

## 2021-05-24 DIAGNOSIS — G4700 Insomnia, unspecified: Secondary | ICD-10-CM | POA: Diagnosis not present

## 2021-05-24 DIAGNOSIS — G43009 Migraine without aura, not intractable, without status migrainosus: Secondary | ICD-10-CM | POA: Diagnosis not present

## 2021-05-24 DIAGNOSIS — I129 Hypertensive chronic kidney disease with stage 1 through stage 4 chronic kidney disease, or unspecified chronic kidney disease: Secondary | ICD-10-CM | POA: Diagnosis not present

## 2021-05-24 DIAGNOSIS — N183 Chronic kidney disease, stage 3 unspecified: Secondary | ICD-10-CM | POA: Diagnosis not present

## 2021-05-24 DIAGNOSIS — E7800 Pure hypercholesterolemia, unspecified: Secondary | ICD-10-CM | POA: Diagnosis not present

## 2021-05-25 DIAGNOSIS — Z0100 Encounter for examination of eyes and vision without abnormal findings: Secondary | ICD-10-CM | POA: Diagnosis not present

## 2021-05-25 DIAGNOSIS — Z961 Presence of intraocular lens: Secondary | ICD-10-CM | POA: Diagnosis not present

## 2021-05-25 DIAGNOSIS — H5203 Hypermetropia, bilateral: Secondary | ICD-10-CM | POA: Diagnosis not present

## 2021-05-25 DIAGNOSIS — H401132 Primary open-angle glaucoma, bilateral, moderate stage: Secondary | ICD-10-CM | POA: Diagnosis not present

## 2021-05-30 DIAGNOSIS — I10 Essential (primary) hypertension: Secondary | ICD-10-CM | POA: Diagnosis not present

## 2021-05-30 DIAGNOSIS — M542 Cervicalgia: Secondary | ICD-10-CM | POA: Diagnosis not present

## 2021-06-01 DIAGNOSIS — M4310 Spondylolisthesis, site unspecified: Secondary | ICD-10-CM | POA: Diagnosis not present

## 2021-07-05 DIAGNOSIS — M4310 Spondylolisthesis, site unspecified: Secondary | ICD-10-CM | POA: Diagnosis not present

## 2021-07-07 ENCOUNTER — Ambulatory Visit
Admission: RE | Admit: 2021-07-07 | Discharge: 2021-07-07 | Disposition: A | Discharge status: Home / Self Care | Payer: Medicare HMO | Source: Ambulatory Visit | Attending: Emergency Medicine | Admitting: Emergency Medicine

## 2021-07-07 DIAGNOSIS — J849 Interstitial pulmonary disease, unspecified: Secondary | ICD-10-CM

## 2021-07-07 DIAGNOSIS — J479 Bronchiectasis, uncomplicated: Secondary | ICD-10-CM | POA: Diagnosis not present

## 2021-07-07 DIAGNOSIS — I2510 Atherosclerotic heart disease of native coronary artery without angina pectoris: Secondary | ICD-10-CM | POA: Diagnosis not present

## 2021-07-07 DIAGNOSIS — K449 Diaphragmatic hernia without obstruction or gangrene: Secondary | ICD-10-CM | POA: Diagnosis not present

## 2021-07-10 DIAGNOSIS — R69 Illness, unspecified: Secondary | ICD-10-CM | POA: Diagnosis not present

## 2021-07-10 DIAGNOSIS — I10 Essential (primary) hypertension: Secondary | ICD-10-CM | POA: Diagnosis not present

## 2021-07-10 DIAGNOSIS — G4700 Insomnia, unspecified: Secondary | ICD-10-CM | POA: Diagnosis not present

## 2021-07-10 DIAGNOSIS — K219 Gastro-esophageal reflux disease without esophagitis: Secondary | ICD-10-CM | POA: Diagnosis not present

## 2021-07-10 DIAGNOSIS — M810 Age-related osteoporosis without current pathological fracture: Secondary | ICD-10-CM | POA: Diagnosis not present

## 2021-07-10 DIAGNOSIS — Z9889 Other specified postprocedural states: Secondary | ICD-10-CM | POA: Diagnosis not present

## 2021-07-10 DIAGNOSIS — R400 Somnolence: Secondary | ICD-10-CM | POA: Diagnosis not present

## 2021-07-10 DIAGNOSIS — E785 Hyperlipidemia, unspecified: Secondary | ICD-10-CM | POA: Diagnosis not present

## 2021-07-10 DIAGNOSIS — H409 Unspecified glaucoma: Secondary | ICD-10-CM | POA: Diagnosis not present

## 2021-07-10 DIAGNOSIS — G8929 Other chronic pain: Secondary | ICD-10-CM | POA: Diagnosis not present

## 2021-07-11 DIAGNOSIS — R400 Somnolence: Secondary | ICD-10-CM | POA: Diagnosis not present

## 2021-07-11 DIAGNOSIS — H40023 Open angle with borderline findings, high risk, bilateral: Secondary | ICD-10-CM | POA: Diagnosis not present

## 2021-07-11 DIAGNOSIS — H00012 Hordeolum externum right lower eyelid: Secondary | ICD-10-CM | POA: Diagnosis not present

## 2021-07-11 DIAGNOSIS — E785 Hyperlipidemia, unspecified: Secondary | ICD-10-CM | POA: Diagnosis not present

## 2021-07-11 DIAGNOSIS — H04123 Dry eye syndrome of bilateral lacrimal glands: Secondary | ICD-10-CM | POA: Diagnosis not present

## 2021-07-13 ENCOUNTER — Telehealth: Payer: Self-pay | Admitting: Cardiology

## 2021-07-13 DIAGNOSIS — R69 Illness, unspecified: Secondary | ICD-10-CM | POA: Diagnosis not present

## 2021-07-13 DIAGNOSIS — G8929 Other chronic pain: Secondary | ICD-10-CM | POA: Diagnosis not present

## 2021-07-13 DIAGNOSIS — I10 Essential (primary) hypertension: Secondary | ICD-10-CM | POA: Diagnosis not present

## 2021-07-13 DIAGNOSIS — M810 Age-related osteoporosis without current pathological fracture: Secondary | ICD-10-CM | POA: Diagnosis not present

## 2021-07-13 NOTE — Telephone Encounter (Signed)
Patient would like Dr. Gardiner Rhyme go over the results of her CT done 07/07/21 and call her

## 2021-07-14 ENCOUNTER — Other Ambulatory Visit: Payer: Self-pay

## 2021-07-14 MED ORDER — ATORVASTATIN CALCIUM 10 MG PO TABS: 10.0000 mg | ORAL_TABLET | Freq: Every day | ORAL | 1 refills | Status: DC

## 2021-07-14 NOTE — Telephone Encounter (Signed)
Called patient, advised that the CT was not ordered by Dr.Schumann, and he would review when he could to give his recommendations.  When he responded we would call her back and let her know.  Patient thankful for call back.   Thanks!

## 2021-07-14 NOTE — Telephone Encounter (Signed)
Updated RX, sent to pharmacy.   Scheduling team can we have her see Dr.Schumann in 3 months?   Thanks!

## 2021-07-14 NOTE — Telephone Encounter (Signed)
Pt never heard back yesterday and would like someone to reach out in regards to her CT

## 2021-07-14 NOTE — Telephone Encounter (Signed)
Spoke with patient, discussed CT results showing some coronary calcifications and aortic valve calcifications.  Had an echocardiogram in 2021 that showed no aortic stenosis and a Lexiscan Myoview in 2021 that showed no ischemia.  Recommend increasing her statin to atorvastatin 10 mg daily instead of 3 times per week.  She is agreeable to this.  Can we schedule follow-up appointment in 3 months?

## 2021-07-25 ENCOUNTER — Other Ambulatory Visit: Payer: Self-pay | Admitting: Emergency Medicine

## 2021-07-25 DIAGNOSIS — J849 Interstitial pulmonary disease, unspecified: Secondary | ICD-10-CM

## 2021-07-26 DIAGNOSIS — M25551 Pain in right hip: Secondary | ICD-10-CM | POA: Diagnosis not present

## 2021-07-26 DIAGNOSIS — M25552 Pain in left hip: Secondary | ICD-10-CM | POA: Diagnosis not present

## 2021-08-08 DIAGNOSIS — M25552 Pain in left hip: Secondary | ICD-10-CM | POA: Diagnosis not present

## 2021-08-08 DIAGNOSIS — M545 Low back pain, unspecified: Secondary | ICD-10-CM | POA: Diagnosis not present

## 2021-08-09 DIAGNOSIS — M542 Cervicalgia: Secondary | ICD-10-CM | POA: Diagnosis not present

## 2021-08-16 DIAGNOSIS — I10 Essential (primary) hypertension: Secondary | ICD-10-CM | POA: Diagnosis not present

## 2021-08-16 DIAGNOSIS — Z6826 Body mass index (BMI) 26.0-26.9, adult: Secondary | ICD-10-CM | POA: Diagnosis not present

## 2021-08-16 DIAGNOSIS — M4310 Spondylolisthesis, site unspecified: Secondary | ICD-10-CM | POA: Diagnosis not present

## 2021-08-17 ENCOUNTER — Other Ambulatory Visit: Payer: Medicare HMO

## 2021-08-17 ENCOUNTER — Other Ambulatory Visit: Payer: Self-pay | Admitting: Family Medicine

## 2021-08-17 DIAGNOSIS — M8580 Other specified disorders of bone density and structure, unspecified site: Secondary | ICD-10-CM

## 2021-08-17 DIAGNOSIS — M542 Cervicalgia: Secondary | ICD-10-CM | POA: Diagnosis not present

## 2021-08-22 DIAGNOSIS — M25552 Pain in left hip: Secondary | ICD-10-CM | POA: Diagnosis not present

## 2021-08-22 DIAGNOSIS — M545 Low back pain, unspecified: Secondary | ICD-10-CM | POA: Diagnosis not present

## 2021-08-23 DIAGNOSIS — I951 Orthostatic hypotension: Secondary | ICD-10-CM | POA: Diagnosis not present

## 2021-08-23 DIAGNOSIS — M1990 Unspecified osteoarthritis, unspecified site: Secondary | ICD-10-CM | POA: Diagnosis not present

## 2021-08-23 DIAGNOSIS — K219 Gastro-esophageal reflux disease without esophagitis: Secondary | ICD-10-CM | POA: Diagnosis not present

## 2021-08-23 DIAGNOSIS — M810 Age-related osteoporosis without current pathological fracture: Secondary | ICD-10-CM | POA: Diagnosis not present

## 2021-08-23 DIAGNOSIS — R69 Illness, unspecified: Secondary | ICD-10-CM | POA: Diagnosis not present

## 2021-08-23 DIAGNOSIS — M25552 Pain in left hip: Secondary | ICD-10-CM | POA: Diagnosis not present

## 2021-08-23 DIAGNOSIS — Z008 Encounter for other general examination: Secondary | ICD-10-CM | POA: Diagnosis not present

## 2021-08-23 DIAGNOSIS — Z7983 Long term (current) use of bisphosphonates: Secondary | ICD-10-CM | POA: Diagnosis not present

## 2021-08-23 DIAGNOSIS — J849 Interstitial pulmonary disease, unspecified: Secondary | ICD-10-CM | POA: Diagnosis not present

## 2021-08-23 DIAGNOSIS — E785 Hyperlipidemia, unspecified: Secondary | ICD-10-CM | POA: Diagnosis not present

## 2021-08-23 DIAGNOSIS — R42 Dizziness and giddiness: Secondary | ICD-10-CM | POA: Diagnosis not present

## 2021-08-23 DIAGNOSIS — G8929 Other chronic pain: Secondary | ICD-10-CM | POA: Diagnosis not present

## 2021-08-23 DIAGNOSIS — G43909 Migraine, unspecified, not intractable, without status migrainosus: Secondary | ICD-10-CM | POA: Diagnosis not present

## 2021-08-23 DIAGNOSIS — I10 Essential (primary) hypertension: Secondary | ICD-10-CM | POA: Diagnosis not present

## 2021-08-24 DIAGNOSIS — H401132 Primary open-angle glaucoma, bilateral, moderate stage: Secondary | ICD-10-CM | POA: Diagnosis not present

## 2021-08-25 DIAGNOSIS — M542 Cervicalgia: Secondary | ICD-10-CM | POA: Diagnosis not present

## 2021-08-28 DIAGNOSIS — H401122 Primary open-angle glaucoma, left eye, moderate stage: Secondary | ICD-10-CM | POA: Diagnosis not present

## 2021-08-28 DIAGNOSIS — E785 Hyperlipidemia, unspecified: Secondary | ICD-10-CM | POA: Diagnosis not present

## 2021-08-28 DIAGNOSIS — I359 Nonrheumatic aortic valve disorder, unspecified: Secondary | ICD-10-CM | POA: Diagnosis not present

## 2021-08-28 DIAGNOSIS — I129 Hypertensive chronic kidney disease with stage 1 through stage 4 chronic kidney disease, or unspecified chronic kidney disease: Secondary | ICD-10-CM | POA: Diagnosis not present

## 2021-08-28 DIAGNOSIS — G894 Chronic pain syndrome: Secondary | ICD-10-CM | POA: Diagnosis not present

## 2021-08-28 DIAGNOSIS — M1712 Unilateral primary osteoarthritis, left knee: Secondary | ICD-10-CM | POA: Diagnosis not present

## 2021-08-28 DIAGNOSIS — H401113 Primary open-angle glaucoma, right eye, severe stage: Secondary | ICD-10-CM | POA: Diagnosis not present

## 2021-08-28 DIAGNOSIS — M8580 Other specified disorders of bone density and structure, unspecified site: Secondary | ICD-10-CM | POA: Diagnosis not present

## 2021-08-28 DIAGNOSIS — Z6826 Body mass index (BMI) 26.0-26.9, adult: Secondary | ICD-10-CM | POA: Diagnosis not present

## 2021-08-29 DIAGNOSIS — M25552 Pain in left hip: Secondary | ICD-10-CM | POA: Diagnosis not present

## 2021-08-29 DIAGNOSIS — M545 Low back pain, unspecified: Secondary | ICD-10-CM | POA: Diagnosis not present

## 2021-08-31 DIAGNOSIS — M542 Cervicalgia: Secondary | ICD-10-CM | POA: Diagnosis not present

## 2021-09-05 DIAGNOSIS — M25552 Pain in left hip: Secondary | ICD-10-CM | POA: Diagnosis not present

## 2021-09-05 DIAGNOSIS — M545 Low back pain, unspecified: Secondary | ICD-10-CM | POA: Diagnosis not present

## 2021-09-08 DIAGNOSIS — M542 Cervicalgia: Secondary | ICD-10-CM | POA: Diagnosis not present

## 2021-09-12 DIAGNOSIS — M545 Low back pain, unspecified: Secondary | ICD-10-CM | POA: Diagnosis not present

## 2021-09-12 DIAGNOSIS — M25552 Pain in left hip: Secondary | ICD-10-CM | POA: Diagnosis not present

## 2021-09-14 DIAGNOSIS — M542 Cervicalgia: Secondary | ICD-10-CM | POA: Diagnosis not present

## 2021-09-20 DIAGNOSIS — M542 Cervicalgia: Secondary | ICD-10-CM | POA: Diagnosis not present

## 2021-09-22 DIAGNOSIS — M542 Cervicalgia: Secondary | ICD-10-CM | POA: Diagnosis not present

## 2021-09-26 DIAGNOSIS — M25552 Pain in left hip: Secondary | ICD-10-CM | POA: Diagnosis not present

## 2021-09-26 DIAGNOSIS — M545 Low back pain, unspecified: Secondary | ICD-10-CM | POA: Diagnosis not present

## 2021-09-28 DIAGNOSIS — M542 Cervicalgia: Secondary | ICD-10-CM | POA: Diagnosis not present

## 2021-10-03 DIAGNOSIS — M25552 Pain in left hip: Secondary | ICD-10-CM | POA: Diagnosis not present

## 2021-10-06 DIAGNOSIS — M542 Cervicalgia: Secondary | ICD-10-CM | POA: Diagnosis not present

## 2021-10-09 DIAGNOSIS — M25552 Pain in left hip: Secondary | ICD-10-CM | POA: Diagnosis not present

## 2021-10-11 DIAGNOSIS — M542 Cervicalgia: Secondary | ICD-10-CM | POA: Diagnosis not present

## 2021-10-13 DIAGNOSIS — E785 Hyperlipidemia, unspecified: Secondary | ICD-10-CM | POA: Diagnosis not present

## 2021-10-16 NOTE — Progress Notes (Unsigned)
Cardiology Office Note:    Date:  10/17/2021   ID:  Anna Arnold, DOB 27-Jan-1951, MRN [id number]  PCP:  Vernie Shanks, MD  Cardiologist:  Donato Heinz, MD  Electrophysiologist:  None   Referring MD: Vernie Shanks, MD   Chief Complaint  Patient presents with   Hypertension    History of Present Illness:    Anna Arnold is a 70 y.o. female with a hx of fibromyalgia, hyperlipidemia who presents for follow-up.  She was referred by Dr. Jacelyn Grip for evaluation of palpitations, initially seen on 12/09/2019.  She reports that her palpitations started 6 weeks prior.  Occurs 2-3 times per week, feels like her heart is racing.  Will last for 2 minutes or so and resolve.  Always occurs at night, especially when she is lying on her left side.  She denies any lightheadedness or syncope during episodes, but does report she has lightheadedness sometimes during the day.  Also reports she has been having some swelling in her left ankle.  In addition, she states that she had some chest pain one week prior.  Describes as left-sided dull aching pain.  Lasted for 1 to 1.5 hours.  For exercise she goes for walks.  Typically does about 10,000 steps per day between walking and her job working at a preschool.  Reports she has noted progressive fatigue and shortness of breath with exertion.  No smoking history.  Family history includes father died of MI at age 109, though she thinks he had a stent at some point prior to this.  Her oldest brother died of MI at age 93.  She is taking atorvastatin 10 mg twice weekly, has been unable to tolerate more frequent dosing.   Lexiscan Myoview on 12/16/2019 showed normal perfusion, EF 58%.  Echocardiogram on 12/28/2019 showed normal biventricular function, no significant valvular disease.  Zio patch x14 days showed one 10 beat run of NSVT, otherwise no significant abnormalities.  Calcium score 0 on 03/21/2020.  Since last clinic visit, she reports that she has been doing  well.  Home BP log has been 120s 140s over 70s to 80s.  Reports has been having some dyspnea on exertion.  Denies any chest pain, lightheadedness, syncope.  Does report she has been having swelling in left ankle.  Has been doing physical therapy twice per week.   Past Medical History:  Diagnosis Date   Anxiety    Arthritis    Cataracts, bilateral    Cervical spondylosis    Degenerative disc disease, lumbar    Depression    Fibromyalgia    GERD (gastroesophageal reflux disease)    Glaucoma    Hypertension    Migraines    Osteopenia    Seasonal allergies    Sleep disorder     Past Surgical History:  Procedure Laterality Date   bunionectomy Right 2008   CATARACT EXTRACTION Bilateral 2015-2016   CESAREAN SECTION  1982   GLAUCOMA SURGERY Bilateral 2013   HAMMER TOE SURGERY Right 2008   ROTATOR CUFF REPAIR Right 2012    Current Medications: Current Meds  Medication Sig   acetaminophen (TYLENOL) 500 MG tablet Take 500 mg by mouth every 6 (six) hours as needed.   albuterol (VENTOLIN HFA) 108 (90 Base) MCG/ACT inhaler Inhale 2 puffs into the lungs every 6 (six) hours as needed for wheezing or shortness of breath.   alendronate (FOSAMAX) 70 MG tablet Take 70 mg by mouth once a week. Take with a  full glass of water on an empty stomach.   atorvastatin (LIPITOR) 10 MG tablet Take 1 tablet (10 mg total) by mouth daily.   buPROPion (WELLBUTRIN SR) 100 MG 12 hr tablet Take 100 mg by mouth daily.   Carboxymethylcellulose Sodium (REFRESH PLUS OP)    Cholecalciferol (VITAMIN D3) 1000 units CAPS Take by mouth.   losartan (COZAAR) 100 MG tablet Take 100 mg by mouth daily.   meclizine (ANTIVERT) 25 MG tablet Take 25 mg by mouth 3 (three) times daily as needed for dizziness.   Multiple Minerals-Vitamins (CALCIUM-MAGNESIUM-ZINC-D3 PO) Take by mouth.   pantoprazole (PROTONIX) 20 MG tablet pantoprazole 20 mg tablet,delayed release   SUMAtriptan (IMITREX) 100 MG tablet Take 100 mg by mouth every 2  (two) hours as needed for migraine. Take 1/2 tablet for migraine. May repeat in 2 hours if headache persists or recurs.   [DISCONTINUED] losartan (COZAAR) 50 MG tablet Take 50 mg by mouth daily.   Current Facility-Administered Medications for the 10/17/21 encounter (Office Visit) with Donato Heinz, MD  Medication   0.9 %  sodium chloride infusion     Allergies:   Brimonidine, Netarsudil, Netarsudil dimesylate, Amitriptyline, Atenolol, Augmentin [amoxicillin-pot clavulanate], Biaxin [clarithromycin], Ceftin [cefuroxime axetil], Chlorthalidone, Citalopram hydrobromide, Erythromycin, Fluoxetine, Lexapro [escitalopram oxalate], Meloxicam, Potassium chloride, Reglan [metoclopramide], Sertraline hcl, Simvastatin, Singulair [montelukast sodium], Travoprost, Viibryd [vilazodone hcl], Zegerid [omeprazole], and Brinzolamide   Social History   Socioeconomic History   Marital status: Married    Spouse name: Not on file   Number of children: Not on file   Years of education: Not on file   Highest education level: Not on file  Occupational History   Not on file  Tobacco Use   Smoking status: Never   Smokeless tobacco: Never  Substance and Sexual Activity   Alcohol use: No   Drug use: No   Sexual activity: Not on file  Other Topics Concern   Not on file  Social History Narrative   Not on file   Social Determinants of Health   Financial Resource Strain: Not on file  Food Insecurity: Not on file  Transportation Needs: Not on file  Physical Activity: Not on file  Stress: Not on file  Social Connections: Not on file     Family History: The patient'sfamily history includes Cancer in her mother; Depression in her brother and mother; Heart attack in her brother; Heart disease in her father. There is no history of Colon cancer, Colon polyps, Esophageal cancer, Rectal cancer, or Stomach cancer.  ROS:   Please see the history of present illness.    All other systems reviewed and are  negative.  EKGs/Labs/Other Studies Reviewed:    The following studies were reviewed today:  EKG:   10/17/21: Normal sinus rhythm, rate 91, incomplete right bundle branch block, right axis deviation   Recent Labs: No results found for requested labs within last 8760 hours.  Recent Lipid Panel No results found for: CHOL, TRIG, HDL, CHOLHDL, VLDL, LDLCALC, LDLDIRECT  Physical Exam:    VS:  BP (!) 144/90   Pulse 91   Ht 5' (1.524 m)   Wt 138 lb (62.6 kg)   SpO2 96%   BMI 26.95 kg/m     Wt Readings from Last 3 Encounters:  10/17/21 138 lb (62.6 kg)  04/03/21 136 lb (61.7 kg)  01/19/21 134 lb 9.6 oz (61.1 kg)     GEN: Well nourished, well developed in no acute distress HEENT: Normal NECK: No JVD; No  carotid bruits CARDIAC: RRR, no murmurs, rubs, gallops RESPIRATORY:  Clear to auscultation without rales, wheezing or rhonchi  ABDOMEN: Soft, non-tender, non-distended MUSCULOSKELETAL:  No edema; No deformity  SKIN: Warm and dry NEUROLOGIC:  Alert and oriented x 3 PSYCHIATRIC:  Normal affect   ASSESSMENT:    1. DOE (dyspnea on exertion)   2. Leg edema, left   3. Palpitations   4. Essential hypertension   5. Hyperlipidemia, unspecified hyperlipidemia type      PLAN:    Chest pain/dyspnea on exertion: Chest pain is atypical in description, but does have CAD risk factors (age, family history, hyperlipidemia).  In addition she reports progressive dyspnea on exertion, which could represent anginal equivalent.  Lexiscan Myoview on 12/16/2019 showed normal perfusion, EF 58%.  Echocardiogram on 12/28/2019 showed normal biventricular function, no significant valvular disease.  Calcium score 0 on 03/21/2020. Reports chest pain has resolved, no further cardiac work-up recommended. -Chest CT consistent with ILD, follows with pulmonology -CT chest 07/07/2021 showed coronary calcifications, along with aortic valve calcifications.  Check echocardiogram  Left lower extremity edema: She  reports history of DVT, has asymmetric swelling in left leg.  Check lower extremity duplex.  Palpitations: Zio patch x14 days showed one 10 beat run of NSVT, otherwise no significant abnormalities.  Reports palpitations have improved.  Hyperlipidemia: On atorvastatin 10 mg three times per week.  LDL 111 on 11/06/2019.  Calcium score 0 on 03/21/2020.  CT chest 07/07/2021 showed coronary calcifications.  Atorvastatin increased to 10 mg daily.  LDL 95 on 10/13/2021, will increase atorvastatin to 20 mg  Hypertension: On losartan 100 mg daily.  Appears reasonably controlled from her home BP log  RTC in 6 months   Medication Adjustments/Labs and Tests Ordered: Current medicines are reviewed at length with the patient today.  Concerns regarding medicines are outlined above.  Orders Placed This Encounter  Procedures   EKG 12-Lead   ECHOCARDIOGRAM COMPLETE   VAS [place] LOWER EXTREMITY VENOUS (DVT)    No orders of the defined types were placed in this encounter.    Patient Instructions  Medication Instructions:  Your physician recommends that you continue on your current medications as directed. Please refer to the Current Medication list given to you today.  *If you need a refill on your cardiac medications before your next appointment, please call your pharmacy*  Testing/Procedures: Your physician has requested that you have an echocardiogram. Echocardiography is a painless test that uses sound waves to create images of your heart. It provides your doctor with information about the size and shape of your heart and how well your heart's chambers and valves are working. This procedure takes approximately one hour. There are no restrictions for this procedure.  Your physician has requested that you have a lower extremity venous duplex. This test is an ultrasound of the veins in the legs. It looks at venous blood flow that carries blood from the heart to the legs . Allow one hour for a Lower Venous  exam. There are no restrictions or special instructions.  Follow-Up: At [facility], you and your health needs are our priority.  As part of our continuing mission to provide you with exceptional heart care, we have created designated Provider Care Teams.  These Care Teams include your primary Cardiologist (physician) and Advanced Practice Providers (APPs -  Physician Assistants and Nurse Practitioners) who all work together to provide you with the care you need, when you need it.  We recommend signing up for the  patient portal called "MyChart".  Sign up information is provided on this After Visit Summary.  MyChart is used to connect with patients for Virtual Visits (Telemedicine).  Patients are able to view lab/test results, encounter notes, upcoming appointments, etc.  Non-urgent messages can be sent to your provider as well.   To learn more about what you can do with MyChart, go to [email or web address].    Your next appointment:   6 month(s)  The format for your next appointment:   In Person  Provider:   Donato Heinz, MD {    Important Information About Sugar         Signed, Donato Heinz, MD  10/17/2021 3:01 PM    [facility]

## 2021-10-17 ENCOUNTER — Ambulatory Visit: Payer: Medicare HMO | Admitting: Cardiology

## 2021-10-17 ENCOUNTER — Encounter: Payer: Self-pay | Admitting: Cardiology

## 2021-10-17 VITALS — BP 144/90 | HR 91 | Ht 60.0 in | Wt 138.0 lb

## 2021-10-17 DIAGNOSIS — R600 Localized edema: Secondary | ICD-10-CM

## 2021-10-17 DIAGNOSIS — I10 Essential (primary) hypertension: Secondary | ICD-10-CM

## 2021-10-17 DIAGNOSIS — R002 Palpitations: Secondary | ICD-10-CM | POA: Diagnosis not present

## 2021-10-17 DIAGNOSIS — R0609 Other forms of dyspnea: Secondary | ICD-10-CM | POA: Diagnosis not present

## 2021-10-17 DIAGNOSIS — E785 Hyperlipidemia, unspecified: Secondary | ICD-10-CM | POA: Diagnosis not present

## 2021-10-17 NOTE — Patient Instructions (Signed)
Medication Instructions:  Your physician recommends that you continue on your current medications as directed. Please refer to the Current Medication list given to you today.  *If you need a refill on your cardiac medications before your next appointment, please call your pharmacy*  Testing/Procedures: Your physician has requested that you have an echocardiogram. Echocardiography is a painless test that uses sound waves to create images of your heart. It provides your doctor with information about the size and shape of your heart and how well your heart's chambers and valves are working. This procedure takes approximately one hour. There are no restrictions for this procedure.  Your physician has requested that you have a lower extremity venous duplex. This test is an ultrasound of the veins in the legs. It looks at venous blood flow that carries blood from the heart to the legs . Allow one hour for a Lower Venous exam. There are no restrictions or special instructions.  Follow-Up: At [facility], you and your health needs are our priority.  As part of our continuing mission to provide you with exceptional heart care, we have created designated Provider Care Teams.  These Care Teams include your primary Cardiologist (physician) and Advanced Practice Providers (APPs -  Physician Assistants and Nurse Practitioners) who all work together to provide you with the care you need, when you need it.  We recommend signing up for the patient portal called "MyChart".  Sign up information is provided on this After Visit Summary.  MyChart is used to connect with patients for Virtual Visits (Telemedicine).  Patients are able to view lab/test results, encounter notes, upcoming appointments, etc.  Non-urgent messages can be sent to your provider as well.   To learn more about what you can do with MyChart, go to [email or web address].    Your next appointment:   6 month(s)  The format for your next  appointment:   In Person  Provider:   Donato Heinz, MD {    Important Information About Sugar

## 2021-10-18 ENCOUNTER — Telehealth: Payer: Self-pay

## 2021-10-18 ENCOUNTER — Ambulatory Visit (HOSPITAL_COMMUNITY)
Admission: RE | Admit: 2021-10-18 | Discharge: 2021-10-18 | Disposition: A | Discharge status: Home / Self Care | Payer: Medicare HMO | Source: Ambulatory Visit | Attending: Cardiology | Admitting: Cardiology

## 2021-10-18 DIAGNOSIS — R600 Localized edema: Secondary | ICD-10-CM | POA: Insufficient documentation

## 2021-10-18 DIAGNOSIS — S0990XA Unspecified injury of head, initial encounter: Secondary | ICD-10-CM | POA: Diagnosis not present

## 2021-10-18 DIAGNOSIS — T148XXA Other injury of unspecified body region, initial encounter: Secondary | ICD-10-CM | POA: Diagnosis not present

## 2021-10-18 NOTE — Telephone Encounter (Signed)
Received notification in triage from front desk staff that patient came in for her DVT ultrasound bleeding from her face, hands, and knees as she had experienced a fall in the parking lot. Patient states that she was getting out of her car and states she was in a parking lot she's never parked before and was trying to figure out how to get around when she tripped on the side walk. Patient denies any lightheadedness or dizziness. Patient's lip was cut slightly and bleeding slightly, her knees are both scraped and bleeding slightly and her left hand was scraped and bleeding slightly. Patient was cleaned up by two triage RN's and Band-Aid's applied to both knees. Ice pack applied to patients lip. Patient is not currently taking any blood thinners or antiplatelet medications. Offered to call EMS for patient but she declined. Patient states that she feels fine at present. Spoke with DOD (Dr. Audie Box) and made him aware, he recommends urgent care visit if needed. Spoke with patient and made her aware of recommendations and she states she will go if she needs to. Patient no longer bleeding at time she left clinic. Advised patient to call back to office with any issues, questions, or concerns. Patient verbalized understanding.   Will forward to MD to make aware.

## 2021-10-19 DIAGNOSIS — M25531 Pain in right wrist: Secondary | ICD-10-CM | POA: Diagnosis not present

## 2021-10-19 DIAGNOSIS — W19XXXA Unspecified fall, initial encounter: Secondary | ICD-10-CM | POA: Diagnosis not present

## 2021-10-20 DIAGNOSIS — M542 Cervicalgia: Secondary | ICD-10-CM | POA: Diagnosis not present

## 2021-10-24 DIAGNOSIS — M542 Cervicalgia: Secondary | ICD-10-CM | POA: Diagnosis not present

## 2021-10-26 DIAGNOSIS — M542 Cervicalgia: Secondary | ICD-10-CM | POA: Diagnosis not present

## 2021-11-14 DIAGNOSIS — M542 Cervicalgia: Secondary | ICD-10-CM | POA: Diagnosis not present

## 2021-11-15 ENCOUNTER — Ambulatory Visit (HOSPITAL_COMMUNITY): Payer: Medicare HMO | Attending: Cardiology

## 2021-11-15 DIAGNOSIS — R0609 Other forms of dyspnea: Secondary | ICD-10-CM | POA: Insufficient documentation

## 2021-11-15 LAB — ECHOCARDIOGRAM COMPLETE
Area-P 1/2: 4.15 cm2
S' Lateral: 2.1 cm

## 2021-11-16 DIAGNOSIS — M542 Cervicalgia: Secondary | ICD-10-CM | POA: Diagnosis not present

## 2021-11-20 DIAGNOSIS — M542 Cervicalgia: Secondary | ICD-10-CM | POA: Diagnosis not present

## 2021-11-21 ENCOUNTER — Other Ambulatory Visit: Payer: Self-pay | Admitting: Family Medicine

## 2021-11-21 DIAGNOSIS — Z1231 Encounter for screening mammogram for malignant neoplasm of breast: Secondary | ICD-10-CM

## 2021-11-23 DIAGNOSIS — H0012 Chalazion right lower eyelid: Secondary | ICD-10-CM | POA: Diagnosis not present

## 2021-11-23 DIAGNOSIS — H10413 Chronic giant papillary conjunctivitis, bilateral: Secondary | ICD-10-CM | POA: Diagnosis not present

## 2021-12-01 DIAGNOSIS — H401132 Primary open-angle glaucoma, bilateral, moderate stage: Secondary | ICD-10-CM | POA: Diagnosis not present

## 2021-12-04 DIAGNOSIS — I700 Atherosclerosis of aorta: Secondary | ICD-10-CM | POA: Diagnosis not present

## 2021-12-04 DIAGNOSIS — E785 Hyperlipidemia, unspecified: Secondary | ICD-10-CM | POA: Diagnosis not present

## 2021-12-04 DIAGNOSIS — I129 Hypertensive chronic kidney disease with stage 1 through stage 4 chronic kidney disease, or unspecified chronic kidney disease: Secondary | ICD-10-CM | POA: Diagnosis not present

## 2021-12-04 DIAGNOSIS — K219 Gastro-esophageal reflux disease without esophagitis: Secondary | ICD-10-CM | POA: Diagnosis not present

## 2021-12-04 DIAGNOSIS — R69 Illness, unspecified: Secondary | ICD-10-CM | POA: Diagnosis not present

## 2021-12-04 DIAGNOSIS — J849 Interstitial pulmonary disease, unspecified: Secondary | ICD-10-CM | POA: Diagnosis not present

## 2021-12-04 DIAGNOSIS — Z9889 Other specified postprocedural states: Secondary | ICD-10-CM | POA: Diagnosis not present

## 2021-12-04 DIAGNOSIS — G43009 Migraine without aura, not intractable, without status migrainosus: Secondary | ICD-10-CM | POA: Diagnosis not present

## 2021-12-04 DIAGNOSIS — M810 Age-related osteoporosis without current pathological fracture: Secondary | ICD-10-CM | POA: Diagnosis not present

## 2021-12-05 DIAGNOSIS — M542 Cervicalgia: Secondary | ICD-10-CM | POA: Diagnosis not present

## 2021-12-06 ENCOUNTER — Ambulatory Visit (INDEPENDENT_AMBULATORY_CARE_PROVIDER_SITE_OTHER): Payer: Medicare HMO

## 2021-12-06 ENCOUNTER — Encounter: Payer: Self-pay | Admitting: Nurse Practitioner

## 2021-12-06 ENCOUNTER — Ambulatory Visit: Payer: Medicare HMO | Admitting: Nurse Practitioner

## 2021-12-06 VITALS — BP 118/74 | HR 75 | Temp 98.0°F | Ht 60.0 in | Wt 137.8 lb

## 2021-12-06 DIAGNOSIS — J309 Allergic rhinitis, unspecified: Secondary | ICD-10-CM | POA: Insufficient documentation

## 2021-12-06 DIAGNOSIS — R0609 Other forms of dyspnea: Secondary | ICD-10-CM | POA: Diagnosis not present

## 2021-12-06 DIAGNOSIS — J849 Interstitial pulmonary disease, unspecified: Secondary | ICD-10-CM

## 2021-12-06 DIAGNOSIS — J3089 Other allergic rhinitis: Secondary | ICD-10-CM | POA: Diagnosis not present

## 2021-12-06 DIAGNOSIS — R0602 Shortness of breath: Secondary | ICD-10-CM | POA: Insufficient documentation

## 2021-12-06 DIAGNOSIS — R058 Other specified cough: Secondary | ICD-10-CM

## 2021-12-06 DIAGNOSIS — J4520 Mild intermittent asthma, uncomplicated: Secondary | ICD-10-CM

## 2021-12-06 DIAGNOSIS — K219 Gastro-esophageal reflux disease without esophagitis: Secondary | ICD-10-CM

## 2021-12-06 DIAGNOSIS — R059 Cough, unspecified: Secondary | ICD-10-CM | POA: Diagnosis not present

## 2021-12-06 LAB — CBC WITH DIFFERENTIAL/PLATELET
Basophils Absolute: 0 10*3/uL (ref 0.0–0.1)
Basophils Relative: 1 % (ref 0.0–3.0)
Eosinophils Absolute: 0.1 10*3/uL (ref 0.0–0.7)
Eosinophils Relative: 2.1 % (ref 0.0–5.0)
HCT: 41.5 % (ref 36.0–46.0)
Hemoglobin: 14.1 g/dL (ref 12.0–15.0)
Lymphocytes Relative: 26.7 % (ref 12.0–46.0)
Lymphs Abs: 1.2 10*3/uL (ref 0.7–4.0)
MCHC: 33.9 g/dL (ref 30.0–36.0)
MCV: 91.5 fl (ref 78.0–100.0)
Monocytes Absolute: 0.6 10*3/uL (ref 0.1–1.0)
Monocytes Relative: 14.6 % — ABNORMAL HIGH (ref 3.0–12.0)
Neutro Abs: 2.4 10*3/uL (ref 1.4–7.7)
Neutrophils Relative %: 55.6 % (ref 43.0–77.0)
Platelets: 236 10*3/uL (ref 150.0–400.0)
RBC: 4.54 Mil/uL (ref 3.87–5.11)
RDW: 13.4 % (ref 11.5–15.5)
WBC: 4.4 10*3/uL (ref 4.0–10.5)

## 2021-12-06 LAB — NITRIC OXIDE: Nitric Oxide: 17

## 2021-12-06 MED ORDER — AZELASTINE HCL 0.1 % NA SOLN: 2.0000 | Freq: Two times a day (BID) | NASAL | 5 refills | Status: DC

## 2021-12-06 MED ORDER — BENZONATATE 200 MG PO CAPS: 200.0000 mg | ORAL_CAPSULE | Freq: Three times a day (TID) | ORAL | 1 refills | Status: DC | PRN

## 2021-12-06 MED ORDER — PANTOPRAZOLE SODIUM 40 MG PO TBEC: 40.0000 mg | DELAYED_RELEASE_TABLET | Freq: Every day | ORAL | 5 refills | Status: DC

## 2021-12-06 NOTE — Progress Notes (Signed)
$'@Patient'v$  ID: Anna Arnold, female    DOB: 1950/10/27, 71 y.o.   MRN: [id number]  Chief Complaint  Patient presents with   Follow-up    Referring provider: Vernie Shanks, MD  HPI: 71 year old female, never smoker followed for asthma and ILD.  She is a patient Dr. Agustina Caroli and last seen in office 01/19/2021.  Past medical history significant for spondylosis and DJD, possible autoimmune disease although serologies negative in April 2021 except for weakly positive ANA, hypertension, sleep apnea, GERD, fibromyalgia, HLD, anxiety, heart murmur, IDA, hiatal hernia.  TEST/EVENTS:  09/25/2019 PFTs: FVC 116, FEV1 104, ratio 99, TLC 116, DLCOcor 101.  No BD; there was mid flow reversibility. 07/07/2021 HRCT chest: Atherosclerosis.  No LAD.  There are some very mild areas of peripheral predominant groundglass attenuation, septal thickening, subpleural reticulation, mild cylindrical traction bronchiectasis and peripheral bronchiolectasis along with some areas of peripheral mucoid impaction within the terminal bronchioles.  No frank honeycombing is present.  Most evident in the lung bases.  There is minimal air trapping.  There is also a 5 mm groundglass nodule in the left lower lobe which is new.  Findings are overall stable and once again categorized as probable UIP; however could also consider NSIP.  Recommended repeat in 12 months. 11/15/2021 echocardiogram: EF 60 to 65%.  G1 DD.  RV size and function is normal.  Unable to measure PASP.  No valvular abnormalities noted.  01/19/2021: OV with Dr. Lamonte Sakai.  Previous mild obstruction on PFT, suspected mixed disease with some very mild peripheral septal thickening and associated inflammatory change without frank honeycombing.  Due to mild obstruction, placed on trial of Breo at last visit.  She initially thought that it was helping but in the end did not feel like it significantly changed her breathing so it was stopped.  She also had some upper airway irritation  and difficulty swallowing.  Felt as though it made her GERD worse as well.  She had COVID in July 2022 with fatigue and myalgias, no real cough or URI symptoms.  Advised to increase exercise and conditioning.  Her ILD was very subtle on previous imaging.  Question whether some of this is GERD, microaspiration.  Plan to repeat in March and if there is any progression then she will be referred to ILD clinic.  12/06/2021: Today-follow-up Patient presents today for follow-up.  She reports that over the last month or so she has been feeling a little more short of breath with activity, especially when she goes outside.  She first noticed this when they were in [place] a few weeks ago and that the humidity was terrible.  Felt like every time she would walk outside, chest would get a little tight and she would feel more winded.  She has also been dealing with a dry, persistent cough.  She has had increased reflux recently, despite taking Protonix.  She has also had nasal congestion with postnasal drip and sneezes often.  Unsure if she is allergic to something or if she needs to return to an allergist, who she was seen by years ago.  She denies any wheezing, hemoptysis, lower extremity edema, fevers, night sweats, anorexia, weight loss.  She denies any worsening arthralgias or myalgias, no joint stiffness, no dry eyes or mouth. She has not tried her albuterol inhaler and shortness of breath resolves with rest. She previously tried [employer] but DPI made her cough more and she had throat irritation, and didn't notice a change in her breathing.  She is on protonix 20 mg daily. She used Zyrtec for a little while with some relief. She was previously on flonase but her ophthalmologist wanted her to stop this in the past due to her glaucoma.   FeNO 17 ppb  Allergies  Allergen Reactions   Brimonidine Other (See Comments)    swelling    Ceftin [Cefuroxime Axetil]     Diarrhea and vomiting   Netarsudil Other (See Comments)    Netarsudil Dimesylate Dermatitis, Itching, Swelling and Other (See Comments)    In eyes, itching     Viibryd [Vilazodone Hcl]     agitation   Amitriptyline     sedation   Atenolol     fatigue   Augmentin [Amoxicillin-Pot Clavulanate] Nausea Only    GI pain   Biaxin [Clarithromycin] Nausea Only    GI pain   Brinzolamide Anxiety   Chlorthalidone     cramping   Citalopram Hydrobromide Other (See Comments)    fatigue   Erythromycin Rash   Fluoxetine Other (See Comments)    agitation   Lexapro [Escitalopram Oxalate]     Agitation and nausea   Meloxicam Nausea Only    Nausea    Potassium Chloride     Abdominal pain   Reglan [Metoclopramide]     Abdominal pain   Sertraline Hcl Other (See Comments)    fatigue   Simvastatin     Joint and muscle pain   Singulair [Montelukast Sodium]     Severe agitation, pruritis    Travoprost    Zegerid [Omeprazole] Nausea Only    nausea    Immunization History  Administered Date(s) Administered   Influenza Split 02/24/2019   Influenza, High Dose Seasonal PF 03/31/2018, 04/14/2020, 04/07/2021   Influenza,inj,Quad PF,6+ Mos 02/20/2016, 03/04/2017   PFIZER(Purple Top)SARS-COV-2 Vaccination 07/21/2019, 08/13/2019, 03/26/2020, 10/27/2020   Pfizer Covid-19 Vaccine Bivalent Booster 52yr & up 06/16/2021   Pneumococcal Conjugate-13 04/13/2014   Pneumococcal Polysaccharide-23 11/09/2015   Tdap 07/31/2010, 04/07/2021   Zoster Recombinat (Shingrix) 06/03/2017, 11/18/2017   Zoster, Live 11/20/2010, 06/03/2017, 11/18/2017    Past Medical History:  Diagnosis Date   Anxiety    Arthritis    Cataracts, bilateral    Cervical spondylosis    Degenerative disc disease, lumbar    Depression    Fibromyalgia    GERD (gastroesophageal reflux disease)    Glaucoma    Hypertension    Migraines    Osteopenia    Seasonal allergies    Sleep disorder     Tobacco History: Social History   Tobacco Use  Smoking Status Never  Smokeless Tobacco  Never   Counseling given: Not Answered   Outpatient Medications Prior to Visit  Medication Sig Dispense Refill   acetaminophen (TYLENOL) 500 MG tablet Take 500 mg by mouth every 6 (six) hours as needed.     albuterol (VENTOLIN HFA) 108 (90 Base) MCG/ACT inhaler Inhale 2 puffs into the lungs every 6 (six) hours as needed for wheezing or shortness of breath. 18 g 5   alendronate (FOSAMAX) 70 MG tablet Take 70 mg by mouth once a week. Take with a full glass of water on an empty stomach.     atorvastatin (LIPITOR) 10 MG tablet Take 1 tablet (10 mg total) by mouth daily. 90 tablet 1   buPROPion (WELLBUTRIN SR) 100 MG 12 hr tablet Take 100 mg by mouth daily.     Cholecalciferol (VITAMIN D3) 1000 units CAPS Take by mouth.     Coenzyme Q10 (COQ10)  200 MG CAPS      losartan (COZAAR) 100 MG tablet Take 100 mg by mouth daily.     meclizine (ANTIVERT) 25 MG tablet Take 25 mg by mouth 3 (three) times daily as needed for dizziness.     Multiple Minerals-Vitamins (CALCIUM-MAGNESIUM-ZINC-D3 PO) Take by mouth.     Omega-3 Fatty Acids (SALMON) 200 MG CAPS Take 1 capsule by mouth.     SUMAtriptan (IMITREX) 100 MG tablet Take 100 mg by mouth every 2 (two) hours as needed for migraine. Take 1/2 tablet for migraine. May repeat in 2 hours if headache persists or recurs.     pantoprazole (PROTONIX) 20 MG tablet pantoprazole 20 mg tablet,delayed release     Carboxymethylcellulose Sodium (REFRESH PLUS OP)      Facility-Administered Medications Prior to Visit  Medication Dose Route Frequency Provider Last Rate Last Admin   0.9 %  sodium chloride infusion  500 mL Intravenous Continuous Nandigam, Kavitha V, MD         Review of Systems:   Constitutional: No weight loss or gain, night sweats, fevers, chills, fatigue, or lassitude. HEENT: No headaches, difficulty swallowing, tooth/dental problems, or sore throat. No itching, ear ache +nasal congestion, post nasal drip, sneezing, frequent throat clearing CV:  No  chest pain, orthopnea, PND, swelling in lower extremities, anasarca, dizziness, palpitations, syncope Resp: +shortness of breath with exertion; dry cough. No excess mucus or change in color of mucus. No hemoptysis. No wheezing.  No chest wall deformity GI:  +heartburn/indigestion. No abdominal pain, nausea, vomiting, diarrhea, change in bowel habits, loss of appetite, bloody stools.  Skin: No rash, lesions, ulcerations MSK:  No joint pain or swelling.  No decreased range of motion.  No back pain. Neuro: No dizziness or lightheadedness.  Psych: No depression or anxiety. Mood stable.     Physical Exam:  BP 118/74 (BP Location: Right Arm, Cuff Size: Normal)   Pulse 75   Temp 98 F (36.7 C) (Oral)   Ht 5' (1.524 m)   Wt 137 lb 12.8 oz (62.5 kg)   SpO2 98%   BMI 26.91 kg/m   GEN: Pleasant, interactive, well-appearing; in no acute distress. HEENT:  Normocephalic and atraumatic. PERRLA. Sclera white. Nasal turbinates boggy, moist and patent bilaterally. No rhinorrhea present. Oropharynx erythematous and moist, without exudate or edema. No lesions, ulcerations  NECK:  Supple w/ fair ROM. No JVD present. Normal carotid impulses w/o bruits. Thyroid symmetrical with no goiter or nodules palpated. No lymphadenopathy.   CV: RRR, no m/r/g, no peripheral edema. Pulses intact, +2 bilaterally. No cyanosis, pallor or clubbing. PULMONARY:  Unlabored, regular breathing. Clear bilaterally A&P w/o wheezes/rales/rhonchi. No accessory muscle use. No dullness to percussion. GI: BS present and normoactive. Soft, non-tender to palpation. No organomegaly or masses detected. No CVA tenderness. MSK: No erythema, warmth or tenderness. Cap refil <2 sec all extrem. No deformities or joint swelling noted.  Neuro: A/Ox3. No focal deficits noted.   Skin: Warm, no lesions or rashe Psych: Normal affect and behavior. Judgement and thought content appropriate.     Lab Results:  CBC    Component Value Date/Time   WBC  5.8 07/06/2019 1057   RBC 4.82 07/06/2019 1057   HGB 15.2 (H) 07/06/2019 1057   HGB 14.5 09/03/2006 1318   HCT 43.6 07/06/2019 1057   HCT 42.6 09/03/2006 1318   PLT 193 07/06/2019 1057   PLT 201 09/03/2006 1318   MCV 90.5 07/06/2019 1057   MCV 87 09/03/2006 1318  MCH 31.5 07/06/2019 1057   MCHC 34.9 07/06/2019 1057   RDW 12.7 07/06/2019 1057   RDW 13.1 09/03/2006 1318   LYMPHSABS 1.4 12/10/2014 1700   LYMPHSABS 2.1 09/03/2006 1318   MONOABS 0.4 12/10/2014 1700   EOSABS 0.1 12/10/2014 1700   EOSABS 0.1 09/03/2006 1318   BASOSABS 0.0 12/10/2014 1700   BASOSABS 0.0 09/03/2006 1318    BMET    Component Value Date/Time   NA 138 07/06/2019 1057   K 4.0 07/06/2019 1057   CL 104 07/06/2019 1057   CO2 24 07/06/2019 1057   GLUCOSE 116 (H) 07/06/2019 1057   BUN 13 07/06/2019 1057   CREATININE 1.06 (H) 07/06/2019 1057   CALCIUM 9.7 07/06/2019 1057   GFRNONAA 54 (L) 07/06/2019 1057   GFRAA >60 07/06/2019 1057    BNP No results found for: "BNP"   Imaging:  ECHOCARDIOGRAM COMPLETE  Result Date: 11/15/2021    ECHOCARDIOGRAM REPORT   Patient Name:   Anna Arnold Date of Exam: 11/15/2021 Medical Rec #:  [id number]       Height:       60.0 in Accession #:    [id number]      Weight:       138.0 lb Date of Birth:  18-Sep-1950       BSA:          1.594 m Patient Age:    22 years        BP:           144/90 mmHg Patient Gender: F               HR:           88 bpm. Exam Location:  [address] Procedure: 2D Echo, 3D Echo, Cardiac Doppler, Color Doppler and Strain Analysis Indications:    R06.00 Dyspnea  History:        Patient has prior history of Echocardiogram examinations, most                 recent 12/28/2019. Risk Factors:Hypertension.  Sonographer:    Wilford Sports Rodgers-Jones RDCS Referring Phys: [id number] [place]  1. Left ventricular ejection fraction, by estimation, is 60 to 65%. The left ventricle has normal function. The left ventricle has no regional wall  motion abnormalities. Left ventricular diastolic parameters were normal. The average left ventricular global longitudinal strain is -24.7 %. The global longitudinal strain is normal.  2. Right ventricular systolic function is normal. The right ventricular size is normal. Tricuspid regurgitation signal is inadequate for assessing PA pressure.  3. The mitral valve is normal in structure. No evidence of mitral valve regurgitation. No evidence of mitral stenosis.  4. The aortic valve is tricuspid. Aortic valve regurgitation is not visualized. No aortic stenosis is present.  5. The inferior vena cava is normal in size with greater than 50% respiratory variability, suggesting right atrial pressure of 3 mmHg. Comparison(s): No significant change from prior study. Conclusion(s)/Recommendation(s): Normal biventricular function without evidence of hemodynamically significant valvular heart disease. FINDINGS  Left Ventricle: Left ventricular ejection fraction, by estimation, is 60 to 65%. The left ventricle has normal function. The left ventricle has no regional wall motion abnormalities. The average left ventricular global longitudinal strain is -24.7 %. The global longitudinal strain is normal. 3D left ventricular ejection fraction analysis performed but not reported based on interpreter judgement due to suboptimal tracking. The left ventricular internal cavity size was normal in size. There is no left ventricular  hypertrophy. Left ventricular diastolic parameters were normal. Right Ventricle: The right ventricular size is normal. No increase in right ventricular wall thickness. Right ventricular systolic function is normal. Tricuspid regurgitation signal is inadequate for assessing PA pressure. The tricuspid regurgitant velocity is 2.25 m/s, and with an assumed right atrial pressure of 3 mmHg, the estimated right ventricular systolic pressure is 02.4 mmHg. Left Atrium: Left atrial size was normal in size. Right Atrium:  Right atrial size was normal in size. Pericardium: There is no evidence of pericardial effusion. Mitral Valve: The mitral valve is normal in structure. No evidence of mitral valve regurgitation. No evidence of mitral valve stenosis. Tricuspid Valve: The tricuspid valve is normal in structure. Tricuspid valve regurgitation is trivial. No evidence of tricuspid stenosis. Aortic Valve: The aortic valve is tricuspid. Aortic valve regurgitation is not visualized. No aortic stenosis is present. Pulmonic Valve: The pulmonic valve was normal in structure. Pulmonic valve regurgitation is not visualized. Aorta: The aortic root, ascending aorta, aortic arch and descending aorta are all structurally normal, with no evidence of dilitation or obstruction. Venous: The inferior vena cava is normal in size with greater than 50% respiratory variability, suggesting right atrial pressure of 3 mmHg. IAS/Shunts: The atrial septum is grossly normal.  LEFT VENTRICLE PLAX 2D LVIDd:         3.50 cm   Diastology LVIDs:         2.10 cm   LV e' medial:    6.42 cm/s LV PW:         0.90 cm   LV E/e' medial:  11.5 LV IVS:        0.90 cm   LV e' lateral:   8.92 cm/s LVOT diam:     1.90 cm   LV E/e' lateral: 8.3 LV SV:         63 LV SV Index:   40        2D Longitudinal Strain LVOT Area:     2.84 cm  2D Strain GLS (A2C):   -25.2 %                          2D Strain GLS (A3C):   -24.4 %                          2D Strain GLS (A4C):   -24.4 %                          2D Strain GLS Avg:     -24.7 %                           3D Volume EF:                          3D EF:        58 %                          LV EDV:       82 ml                          LV ESV:       34 ml  LV SV:        48 ml RIGHT VENTRICLE             IVC RV Basal diam:  3.60 cm     IVC diam: 1.60 cm RV S prime:     16.60 cm/s TAPSE (M-mode): 2.0 cm LEFT ATRIUM             Index        RIGHT ATRIUM          Index LA diam:        3.40 cm 2.13 cm/m   RA Area:      9.14 cm LA Vol (A2C):   20.5 ml 12.86 ml/m  RA Volume:   18.40 ml 11.54 ml/m LA Vol (A4C):   29.7 ml 18.63 ml/m LA Biplane Vol: 26.1 ml 16.37 ml/m  AORTIC VALVE LVOT Vmax:   127.00 cm/s LVOT Vmean:  82.500 cm/s LVOT VTI:    0.222 m  AORTA Ao Root diam: 3.00 cm Ao Asc diam:  2.90 cm MITRAL VALVE               TRICUSPID VALVE MV Area (PHT): 4.15 cm    TR Peak grad:   20.2 mmHg MV Decel Time: 183 msec    TR Vmax:        225.00 cm/s MV E velocity: 73.85 cm/s MV A velocity: 94.65 cm/s  SHUNTS MV E/A ratio:  0.78        Systemic VTI:  0.22 m                            Systemic Diam: 1.90 cm Buford Dresser MD Electronically signed by Buford Dresser MD Signature Date/Time: 11/15/2021/8:04:13 PM    Final          Latest Ref Rng & Units 09/25/2019   12:48 PM  PFT Results  FVC-Pre L 2.99   FVC-Predicted Pre % 116   FVC-Post L 2.96   FVC-Predicted Post % 115   Pre FEV1/FVC % % 68   Post FEV1/FCV % % 74   FEV1-Pre L 2.03   FEV1-Predicted Pre % 104   FEV1-Post L 2.18   DLCO uncorrected ml/min/mmHg 17.19   DLCO UNC% % 101   DLCO corrected ml/min/mmHg 17.19   DLCO COR %Predicted % 101   DLVA Predicted % 91   TLC L 5.18   TLC % Predicted % 116   RV % Predicted % 116     Lab Results  Component Value Date   NITRICOXIDE 17 12/06/2021        Assessment & Plan:   DOE (dyspnea on exertion) Unclear if this is related to her underlying mild obstruction/asthma or ILD. Her HRCT from February did not show any progression of her disease and has very subtle changes in UIP vs NSIP. Her FeNO was nl today and she didn't have a significant response to [facility] in the past. Recent echo nl. Plan for repeat PFTs for further evaluation. Recommended she trial albuterol with SOB in interim and notify if this improves her breathing so we can try her on long acting bronchodilators again. CXR today to rule out acute process. We will check a CBC today to rule out anemia and assess eosinophils.   Patient  Instructions  Continue Albuterol inhaler 2 puffs every 6 hours as needed for shortness of breath or wheezing. Notify if symptoms persist despite rescue inhaler/neb use.  Restart Zyrtec  1 tab daily for allergies Increase protonix to 40 mg daily  Use pepcid (famotidine) 20 mg over the counter 30 minutes prior to bedtime  Astelin nasal spray 2 sprays each nostril Twice daily for nasal drainage/congestion Chlorpheniramine 4 mg tab over the counter At bedtime for cough/allergies  Tessalon Perles 1 capsule Three times a day as needed for cough  Labs - allergen panel and CBC with diff  Pulmonary function testing scheduled today   Follow up with Dr. Lamonte Sakai or Joellen Jersey Keanu Frickey,NP after PFTs. If symptoms do not improve or worsen, please contact office for sooner follow up or seek emergency care.     Upper airway cough syndrome Persistent dry cough with worsening upper respiratory and GERD symptoms. Suspect her cough is related to upper airway irritation; however, given her concurrent increased in DOE, further workup indicated (see above). FeNO nl today. Recommended we increase her PPI and start her on H2 blocker at bedtime. Target postnasal drainage/allergies with Astelin and Zyrtec and cough control with tessalon and chlortab. We are going to check an allergen panel to assess for environmental triggers given her persistent upper respiratory symptoms.   ILD (interstitial lung disease) ([place]) Very subtle changes in UIP pattern on HRCT from February without progression of disease. Given the stability of the changes, could be NSIP vs UIP. Her previous autoimmune workup was negative aside from weakly positive ANA. She has no other systemic symptoms of CTD. Repeat PFTs and may need to repeat imaging/labs if worsening restriction. See above plan.   Allergic rhinitis Increase nasal congestion, postnasal drip, and sneezing over the past month, especially after visiting [place]. See above plan.   I spent 42 minutes of  dedicated to the care of this patient on the date of this encounter to include pre-visit review of records, face-to-face time with the patient discussing conditions above, post visit ordering of testing, clinical documentation with the electronic health record, making appropriate referrals as documented, and communicating necessary findings to members of the patients care team.  Clayton Bibles, NP 12/06/2021  Pt aware and understands NP's role.

## 2021-12-06 NOTE — Assessment & Plan Note (Signed)
Increase nasal congestion, postnasal drip, and sneezing over the past month, especially after visiting [place]. See above plan.

## 2021-12-06 NOTE — Assessment & Plan Note (Signed)
Persistent dry cough with worsening upper respiratory and GERD symptoms. Suspect her cough is related to upper airway irritation; however, given her concurrent increased in DOE, further workup indicated (see above). FeNO nl today. Recommended we increase her PPI and start her on H2 blocker at bedtime. Target postnasal drainage/allergies with Astelin and Zyrtec and cough control with tessalon and chlortab. We are going to check an allergen panel to assess for environmental triggers given her persistent upper respiratory symptoms.

## 2021-12-06 NOTE — Assessment & Plan Note (Signed)
Unclear if this is related to her underlying mild obstruction/asthma or ILD. Her HRCT from February did not show any progression of her disease and has very subtle changes in UIP vs NSIP. Her FeNO was nl today and she didn't have a significant response to [facility] in the past. Recent echo nl. Plan for repeat PFTs for further evaluation. Recommended she trial albuterol with SOB in interim and notify if this improves her breathing so we can try her on long acting bronchodilators again. CXR today to rule out acute process. We will check a CBC today to rule out anemia and assess eosinophils.   Patient Instructions  Continue Albuterol inhaler 2 puffs every 6 hours as needed for shortness of breath or wheezing. Notify if symptoms persist despite rescue inhaler/neb use.  Restart Zyrtec 1 tab daily for allergies Increase protonix to 40 mg daily  Use pepcid (famotidine) 20 mg over the counter 30 minutes prior to bedtime  Astelin nasal spray 2 sprays each nostril Twice daily for nasal drainage/congestion Chlorpheniramine 4 mg tab over the counter At bedtime for cough/allergies  Tessalon Perles 1 capsule Three times a day as needed for cough  Labs - allergen panel and CBC with diff  Pulmonary function testing scheduled today   Follow up with Dr. Lamonte Sakai or Joellen Jersey Rhylie Stehr,NP after PFTs. If symptoms do not improve or worsen, please contact office for sooner follow up or seek emergency care.

## 2021-12-06 NOTE — Assessment & Plan Note (Signed)
Very subtle changes in UIP pattern on HRCT from February without progression of disease. Given the stability of the changes, could be NSIP vs UIP. Her previous autoimmune workup was negative aside from weakly positive ANA. She has no other systemic symptoms of CTD. Repeat PFTs and may need to repeat imaging/labs if worsening restriction. See above plan.

## 2021-12-06 NOTE — Patient Instructions (Addendum)
Continue Albuterol inhaler 2 puffs every 6 hours as needed for shortness of breath or wheezing. Notify if symptoms persist despite rescue inhaler/neb use.  Restart Zyrtec 1 tab daily for allergies Increase protonix to 40 mg daily  Use pepcid (famotidine) 20 mg over the counter 30 minutes prior to bedtime  Astelin nasal spray 2 sprays each nostril Twice daily for nasal drainage/congestion Chlorpheniramine 4 mg tab over the counter At bedtime for cough/allergies  Tessalon Perles 1 capsule Three times a day as needed for cough  Labs - allergen panel and CBC with diff  Pulmonary function testing scheduled today   Follow up with Dr. Lamonte Sakai or Joellen Jersey Laiyla Slagel,NP after PFTs. If symptoms do not improve or worsen, please contact office for sooner follow up or seek emergency care.

## 2021-12-07 DIAGNOSIS — R350 Frequency of micturition: Secondary | ICD-10-CM | POA: Diagnosis not present

## 2021-12-09 LAB — ALLERGEN PANEL (27) + IGE
Alternaria Alternata IgE: <0.1 kU/L
Aspergillus Fumigatus IgE: <0.1 kU/L
Bahia Grass IgE: <0.1 kU/L
Bermuda Grass IgE: <0.1 kU/L
Cat Dander IgE: <0.1 kU/L
Cedar, Mountain IgE: <0.1 kU/L
Cladosporium Herbarum IgE: <0.1 kU/L
Cocklebur IgE: <0.1 kU/L
Cockroach, American IgE: <0.1 kU/L
Common Silver Birch IgE: <0.1 kU/L
D Farinae IgE: <0.1 kU/L
D Pteronyssinus IgE: <0.1 kU/L
Dog Dander IgE: <0.1 kU/L
Elm, American IgE: <0.1 kU/L
Hickory, White IgE: <0.1 kU/L
IgE (Immunoglobulin E), Serum: 2 IU/mL — ABNORMAL LOW (ref 6–495)
Johnson Grass IgE: <0.1 kU/L
Kentucky Bluegrass IgE: <0.1 kU/L
Maple/Box Elder IgE: <0.1 kU/L
Mucor Racemosus IgE: <0.1 kU/L
Oak, White IgE: <0.1 kU/L
Penicillium Chrysogen IgE: <0.1 kU/L
Pigweed, Rough IgE: <0.1 kU/L
Plantain, English IgE: <0.1 kU/L
Ragweed, Short IgE: <0.1 kU/L
Setomelanomma Rostrat: <0.1 kU/L
Timothy Grass IgE: <0.1 kU/L
White Mulberry IgE: <0.1 kU/L

## 2021-12-11 ENCOUNTER — Telehealth: Payer: Self-pay | Admitting: Nurse Practitioner

## 2021-12-11 NOTE — Telephone Encounter (Signed)
Allergen panel negative. CBC was unremarkable. No changes to current regimen. Thanks.

## 2021-12-11 NOTE — Telephone Encounter (Signed)
Pt calling requesting to know recent lab results. Katie, please advise.

## 2021-12-11 NOTE — Telephone Encounter (Signed)
Called and spoke with pt letting her know the results of bloodwork and she verbalized understanding. Nothing further needed. 

## 2021-12-19 DIAGNOSIS — M542 Cervicalgia: Secondary | ICD-10-CM | POA: Diagnosis not present

## 2022-01-03 ENCOUNTER — Ambulatory Visit: Payer: Medicare HMO

## 2022-01-11 ENCOUNTER — Ambulatory Visit: Payer: Medicare HMO

## 2022-01-18 DIAGNOSIS — Z1389 Encounter for screening for other disorder: Secondary | ICD-10-CM | POA: Diagnosis not present

## 2022-01-18 DIAGNOSIS — Z0000 Encounter for general adult medical examination without abnormal findings: Secondary | ICD-10-CM | POA: Diagnosis not present

## 2022-01-18 DIAGNOSIS — Z1211 Encounter for screening for malignant neoplasm of colon: Secondary | ICD-10-CM | POA: Diagnosis not present

## 2022-01-26 ENCOUNTER — Encounter: Payer: Self-pay | Admitting: Nurse Practitioner

## 2022-01-26 ENCOUNTER — Ambulatory Visit: Payer: Medicare HMO | Admitting: Nurse Practitioner

## 2022-01-26 ENCOUNTER — Ambulatory Visit (INDEPENDENT_AMBULATORY_CARE_PROVIDER_SITE_OTHER): Payer: Medicare HMO | Admitting: Emergency Medicine

## 2022-01-26 VITALS — BP 128/84 | HR 85 | Temp 98.5°F | Ht 60.0 in | Wt 138.0 lb

## 2022-01-26 DIAGNOSIS — R0609 Other forms of dyspnea: Secondary | ICD-10-CM

## 2022-01-26 DIAGNOSIS — J4520 Mild intermittent asthma, uncomplicated: Secondary | ICD-10-CM | POA: Diagnosis not present

## 2022-01-26 DIAGNOSIS — J849 Interstitial pulmonary disease, unspecified: Secondary | ICD-10-CM

## 2022-01-26 DIAGNOSIS — K219 Gastro-esophageal reflux disease without esophagitis: Secondary | ICD-10-CM

## 2022-01-26 DIAGNOSIS — M2550 Pain in unspecified joint: Secondary | ICD-10-CM | POA: Diagnosis not present

## 2022-01-26 LAB — PULMONARY FUNCTION TEST
DL/VA % pred: 83 %
DL/VA: 3.54 ml/min/mmHg/L
DLCO cor % pred: 94 %
DLCO cor: 15.88 ml/min/mmHg
DLCO unc % pred: 96 %
DLCO unc: 16.21 ml/min/mmHg
FEF 25-75 Post: 1.74 L/sec
FEF 25-75 Pre: 1.21 L/sec
FEF2575-%Change-Post: 44 %
FEF2575-%Pred-Post: 106 %
FEF2575-%Pred-Pre: 74 %
FEV1-%Change-Post: 6 %
FEV1-%Pred-Post: 109 %
FEV1-%Pred-Pre: 102 %
FEV1-Post: 2.05 L
FEV1-Pre: 1.91 L
FEV1FVC-%Change-Post: 4 %
FEV1FVC-%Pred-Pre: 93 %
FEV6-%Change-Post: 4 %
FEV6-%Pred-Post: 116 %
FEV6-%Pred-Pre: 112 %
FEV6-Post: 2.76 L
FEV6-Pre: 2.65 L
FEV6FVC-%Change-Post: 1 %
FEV6FVC-%Pred-Post: 105 %
FEV6FVC-%Pred-Pre: 103 %
FVC-%Change-Post: 2 %
FVC-%Pred-Post: 110 %
FVC-%Pred-Pre: 108 %
FVC-Post: 2.76 L
FVC-Pre: 2.69 L
Post FEV1/FVC ratio: 74 %
Post FEV6/FVC ratio: 100 %
Pre FEV1/FVC ratio: 71 %
Pre FEV6/FVC Ratio: 98 %
RV % pred: 62 %
RV: 1.26 L
TLC % pred: 92 %
TLC: 4.1 L

## 2022-01-26 NOTE — Patient Instructions (Addendum)
Continue Albuterol inhaler 2 puffs every 6 hours as needed for shortness of breath or wheezing. Notify if symptoms persist despite rescue inhaler/neb use. Restart Zyrtec 1 tab daily for allergies during fall season  Continue Astelin nasal spray 2 sprays each nostril Twice daily for nasal drainage/congestion Continue Chlorpheniramine 4 mg tab over the counter At bedtime for cough/allergies  Continue Tessalon Perles 1 capsule Three times a day as needed for cough  Decrease protonix to 20 mg daily for one week then stop.  Use pepcid (famotidine) 20 mg over the counter 30 minutes prior to bedtime. Can use in AM as well if you need to  Labs today   High resolution CT chest    Follow up with Dr. Lamonte Sakai in February 2024 after CT chest. If symptoms do not improve or worsen, please contact office for sooner follow up or seek emergency care.

## 2022-01-26 NOTE — Patient Instructions (Signed)
Full PFT performed today. °

## 2022-01-26 NOTE — Progress Notes (Signed)
Full PFT performed today. °

## 2022-01-26 NOTE — Assessment & Plan Note (Addendum)
Worsening arthralgias over the past month or two. She also feels like the deformities in her hands have worsened. She had previous autoimmune labs with negative rheumatoid factor in 08/2019. Recommended we repeat ANA, RA factor, Anti DNA, CCP and refer her back to rheumatology. Her underlying ILD could very well be related to underlying autoimmune disease with CTD. HRCT from March was stable. She is currently using tylenol extra strength once a day for pain; advised her to increase to bid dosing and follow up with PCP while she waits for rheum appt.   Patient Instructions  Continue Albuterol inhaler 2 puffs every 6 hours as needed for shortness of breath or wheezing. Notify if symptoms persist despite rescue inhaler/neb use. Restart Zyrtec 1 tab daily for allergies during fall season  Continue Astelin nasal spray 2 sprays each nostril Twice daily for nasal drainage/congestion Continue Chlorpheniramine 4 mg tab over the counter At bedtime for cough/allergies  Continue Tessalon Perles 1 capsule Three times a day as needed for cough  Decrease protonix to 20 mg daily for one week then stop.  Use pepcid (famotidine) 20 mg over the counter 30 minutes prior to bedtime. Can use in AM as well if you need to  Labs today   High resolution CT chest    Follow up with Dr. Lamonte Sakai in February 2024 after CT chest. If symptoms do not improve or worsen, please contact office for sooner follow up or seek emergency care.

## 2022-01-26 NOTE — Progress Notes (Signed)
$'@Patient'I$  ID: Anna Arnold, female    DOB: Feb 20, 1951, 71 y.o.   MRN: [id number]  Chief Complaint  Patient presents with   Follow-up    PFT Results    Referring provider: No ref. provider found  HPI: 71 year old female, never smoker followed for asthma and ILD.  She is a patient Dr. Agustina Caroli and last seen in office 01/19/2021.  Past medical history significant for spondylosis and DJD, possible autoimmune disease although serologies negative in April 2021 except for weakly positive ANA, hypertension, sleep apnea, GERD, fibromyalgia, HLD, anxiety, heart murmur, IDA, hiatal hernia.  TEST/EVENTS:  09/25/2019 PFTs: FVC 116, FEV1 104, ratio 99, TLC 116, DLCOcor 101.  No BD; there was mid flow reversibility. 07/07/2021 HRCT chest: Atherosclerosis.  No LAD.  There are some very mild areas of peripheral predominant groundglass attenuation, septal thickening, subpleural reticulation, mild cylindrical traction bronchiectasis and peripheral bronchiolectasis along with some areas of peripheral mucoid impaction within the terminal bronchioles.  No frank honeycombing is present.  Most evident in the lung bases.  There is minimal air trapping.  There is also a 5 mm groundglass nodule in the left lower lobe which is new.  Findings are overall stable and once again categorized as probable UIP; however could also consider NSIP.  Recommended repeat in 12 months. 11/15/2021 echocardiogram: EF 60 to 65%.  G1 DD.  RV size and function is normal.  Unable to measure PASP.  No valvular abnormalities noted.  01/19/2021: OV with Dr. Lamonte Sakai.  Previous mild obstruction on PFT, suspected mixed disease with some very mild peripheral septal thickening and associated inflammatory change without frank honeycombing.  Due to mild obstruction, placed on trial of Breo at last visit.  She initially thought that it was helping but in the end did not feel like it significantly changed her breathing so it was stopped.  She also had some upper  airway irritation and difficulty swallowing.  Felt as though it made her GERD worse as well.  She had COVID in July 2022 with fatigue and myalgias, no real cough or URI symptoms.  Advised to increase exercise and conditioning.  Her ILD was very subtle on previous imaging.  Question whether some of this is GERD, microaspiration.  Plan to repeat in March and if there is any progression then she will be referred to ILD clinic.  12/06/2021: OV with Canda Podgorski NP for follow-up.  She reports that over the last month or so she has been feeling a little more short of breath with activity, especially when she goes outside.  She first noticed this when they were in [place] a few weeks ago and that the humidity was terrible.  Felt like every time she would walk outside, chest would get a little tight and she would feel more winded.  She has also been dealing with a dry, persistent cough.  She has had increased reflux recently, despite taking Protonix.  She has also had nasal congestion with postnasal drip and sneezes often.  Unsure if she is allergic to something or if she needs to return to an allergist, who she was seen by years ago.  She denies any wheezing, hemoptysis, lower extremity edema, fevers, night sweats, anorexia, weight loss.  She denies any worsening arthralgias or myalgias, no joint stiffness, no dry eyes or mouth. She has not tried her albuterol inhaler and shortness of breath resolves with rest. She previously tried [employer] but DPI made her cough more and she had throat irritation, and didn't notice  a change in her breathing. She is on protonix 20 mg daily. She used Zyrtec for a little while with some relief. She was previously on flonase but her ophthalmologist wanted her to stop this in the past due to her glaucoma. FeNO 17 ppb. CXR with mild interstitial pattern; noted on HRCT from February. Allergen panel negative. PFTs ordered for further evaluation.   01/26/2022: Today - follow up Patient presents today with  her husband for follow up after pulmonary function testing.  Pulmonary function testing was overall normal.  She did have some mid flow reversibility and lung volumes were reduced when compared to years ago but still considered normal at 92%; otherwise no significant findings.  She reports that her breathing is relatively the same as it was last time she was here.  She gets winded with long distances or climbing; also if it is hot outside, she feels like she gets more short of breath. Her biggest concern over the past month or so has been worsening joint pain and increased stiffness, especially in her neck and hands.  She is worried that the Protonix that she has been on has caused worsening osteoarthritis.  She is also worried that she may have rheumatoid arthritis.  She denies any redness to her joints but does notice some occasional swelling in her left ankle, which has been ongoing for years now.  It does seem to hurt her a little bit more than it used to.  She also feels like her fatigue has gotten a little bit worse; unsure if this is related to pain or not sleeping as well.  She denies any muscle weakness, dry eyes, dry mouth, skin tightening.  Cough has improved since last time she was here.  Feels like it does not really bother her much anymore.  Still has occasional nasal congestion and throat clearing. Denies any wheezing, chest congestion, hemoptysis, fevers, orthopnea.  She was previously tried on [place] and did not notice much of a difference.  She has not tried albuterol.  She had trouble with reflux at last visit this so we increased her Protonix to 40 mg daily.  Allergies  Allergen Reactions   Brimonidine Other (See Comments)    swelling    Ceftin [Cefuroxime Axetil]     Diarrhea and vomiting   Netarsudil Other (See Comments)   Netarsudil Dimesylate Dermatitis, Itching, Swelling and Other (See Comments)    In eyes, itching     Viibryd [Vilazodone Hcl]     agitation   Amitriptyline      sedation   Atenolol     fatigue   Augmentin [Amoxicillin-Pot Clavulanate] Nausea Only    GI pain   Biaxin [Clarithromycin] Nausea Only    GI pain   Brinzolamide Anxiety   Chlorthalidone     cramping   Citalopram Hydrobromide Other (See Comments)    fatigue   Erythromycin Rash   Fluoxetine Other (See Comments)    agitation   Lexapro [Escitalopram Oxalate]     Agitation and nausea   Meloxicam Nausea Only    Nausea    Potassium Chloride     Abdominal pain   Reglan [Metoclopramide]     Abdominal pain   Sertraline Hcl Other (See Comments)    fatigue   Simvastatin     Joint and muscle pain   Singulair [Montelukast Sodium]     Severe agitation, pruritis    Travoprost    Zegerid [Omeprazole] Nausea Only    nausea  Immunization History  Administered Date(s) Administered   Influenza Split 02/24/2019   Influenza, High Dose Seasonal PF 03/31/2018, 04/14/2020, 04/07/2021   Influenza,inj,Quad PF,6+ Mos 02/20/2016, 03/04/2017   PFIZER(Purple Top)SARS-COV-2 Vaccination 07/21/2019, 08/13/2019, 03/26/2020, 10/27/2020   Pfizer Covid-19 Vaccine Bivalent Booster 55yr & up 06/16/2021   Pneumococcal Conjugate-13 04/13/2014   Pneumococcal Polysaccharide-23 11/09/2015   Tdap 07/31/2010, 04/07/2021   Zoster Recombinat (Shingrix) 06/03/2017, 11/18/2017   Zoster, Live 11/20/2010, 06/03/2017, 11/18/2017    Past Medical History:  Diagnosis Date   Anxiety    Arthritis    Cataracts, bilateral    Cervical spondylosis    Degenerative disc disease, lumbar    Depression    Fibromyalgia    GERD (gastroesophageal reflux disease)    Glaucoma    Hypertension    Migraines    Osteopenia    Seasonal allergies    Sleep disorder     Tobacco History: Social History   Tobacco Use  Smoking Status Never   Passive exposure: Past  Smokeless Tobacco Never   Counseling given: Not Answered   Outpatient Medications Prior to Visit  Medication Sig Dispense Refill   acetaminophen (TYLENOL)  500 MG tablet Take 500 mg by mouth every 6 (six) hours as needed.     albuterol (VENTOLIN HFA) 108 (90 Base) MCG/ACT inhaler Inhale 2 puffs into the lungs every 6 (six) hours as needed for wheezing or shortness of breath. 18 g 5   alendronate (FOSAMAX) 70 MG tablet Take 70 mg by mouth once a week. Take with a full glass of water on an empty stomach.     atorvastatin (LIPITOR) 10 MG tablet Take 1 tablet (10 mg total) by mouth daily. 90 tablet 1   azelastine (ASTELIN) 0.1 % nasal spray Place 2 sprays into both nostrils 2 (two) times daily. Use in each nostril as directed 30 mL 5   benzonatate (TESSALON) 200 MG capsule Take 1 capsule (200 mg total) by mouth 3 (three) times daily as needed for cough. 30 capsule 1   buPROPion (WELLBUTRIN SR) 100 MG 12 hr tablet Take 100 mg by mouth daily.     Cholecalciferol (VITAMIN D3) 1000 units CAPS Take by mouth.     Coenzyme Q10 (COQ10) 200 MG CAPS      losartan (COZAAR) 100 MG tablet Take 100 mg by mouth daily.     meclizine (ANTIVERT) 25 MG tablet Take 25 mg by mouth 3 (three) times daily as needed for dizziness.     Multiple Minerals-Vitamins (CALCIUM-MAGNESIUM-ZINC-D3 PO) Take by mouth.     Omega-3 Fatty Acids (SALMON) 200 MG CAPS Take 1 capsule by mouth.     pantoprazole (PROTONIX) 40 MG tablet Take 1 tablet (40 mg total) by mouth daily. 30 tablet 5   SUMAtriptan (IMITREX) 100 MG tablet Take 100 mg by mouth every 2 (two) hours as needed for migraine. Take 1/2 tablet for migraine. May repeat in 2 hours if headache persists or recurs.     Facility-Administered Medications Prior to Visit  Medication Dose Route Frequency Provider Last Rate Last Admin   0.9 %  sodium chloride infusion  500 mL Intravenous Continuous Nandigam, Kavitha V, MD         Review of Systems:   Constitutional: No weight loss or gain, night sweats, fevers, chills, fatigue, or lassitude. HEENT: No headaches, difficulty swallowing, tooth/dental problems, or sore throat. No itching, ear  ache, sneezing +nasal congestion, post nasal drip, throat clearing (stable) CV:  No chest pain, orthopnea, PND, swelling  in lower extremities, anasarca, dizziness, palpitations, syncope Resp: +shortness of breath with exertion (stable); dry cough (improved). No excess mucus or change in color of mucus. No hemoptysis. No wheezing.  No chest wall deformity GI:  +heartburn/indigestion. No abdominal pain, nausea, vomiting, diarrhea, change in bowel habits, loss of appetite, bloody stools.  Skin: No rash, lesions, ulcerations MSK:  +increased joint pain; left ankle swelling (stable).  No decreased range of motion.  +Increased neck and back pain. Neuro: No dizziness or lightheadedness.  Psych: No depression or anxiety. Mood stable.     Physical Exam:  BP 128/84 (BP Location: Right Arm, Patient Position: Sitting, Cuff Size: Normal)   Pulse 85   Temp 98.5 F (36.9 C) (Oral)   Ht 5' (1.524 m)   Wt 138 lb (62.6 kg)   SpO2 97%   BMI 26.95 kg/m   GEN: Pleasant, interactive, well-appearing; in no acute distress. HEENT:  Normocephalic and atraumatic. PERRLA. Sclera white. Nasal turbinates boggy, moist and patent bilaterally. No rhinorrhea present. Oropharynx erythematous and moist, without exudate or edema. No lesions, ulcerations  NECK:  Supple w/ fair ROM. No JVD present. Normal carotid impulses w/o bruits. Thyroid symmetrical with no goiter or nodules palpated. No lymphadenopathy.   CV: RRR, no m/r/g, no peripheral edema. Pulses intact, +2 bilaterally. No cyanosis, pallor or clubbing. PULMONARY:  Unlabored, regular breathing. Clear bilaterally A&P w/o wheezes/rales/rhonchi. No accessory muscle use. No dullness to percussion. GI: BS present and normoactive. Soft, non-tender to palpation. No organomegaly or masses detected. No CVA tenderness. MSK: No erythema, warmth or tenderness. Cap refil <2 sec all extrem. Swan neck appearance to right #2 and 3 fingers. Deformity #2 DIP left  Neuro: A/Ox3. No  focal deficits noted.   Skin: Warm, no lesions or rashe Psych: Normal affect and behavior. Judgement and thought content appropriate.     Lab Results:  CBC    Component Value Date/Time   WBC 4.4 12/06/2021 1135   RBC 4.54 12/06/2021 1135   HGB 14.1 12/06/2021 1135   HGB 14.5 09/03/2006 1318   HCT 41.5 12/06/2021 1135   HCT 42.6 09/03/2006 1318   PLT 236.0 12/06/2021 1135   PLT 201 09/03/2006 1318   MCV 91.5 12/06/2021 1135   MCV 87 09/03/2006 1318   MCH 31.5 07/06/2019 1057   MCHC 33.9 12/06/2021 1135   RDW 13.4 12/06/2021 1135   RDW 13.1 09/03/2006 1318   LYMPHSABS 1.2 12/06/2021 1135   LYMPHSABS 2.1 09/03/2006 1318   MONOABS 0.6 12/06/2021 1135   EOSABS 0.1 12/06/2021 1135   EOSABS 0.1 09/03/2006 1318   BASOSABS 0.0 12/06/2021 1135   BASOSABS 0.0 09/03/2006 1318    BMET    Component Value Date/Time   NA 138 07/06/2019 1057   K 4.0 07/06/2019 1057   CL 104 07/06/2019 1057   CO2 24 07/06/2019 1057   GLUCOSE 116 (H) 07/06/2019 1057   BUN 13 07/06/2019 1057   CREATININE 1.06 (H) 07/06/2019 1057   CALCIUM 9.7 07/06/2019 1057   GFRNONAA 54 (L) 07/06/2019 1057   GFRAA >60 07/06/2019 1057    BNP No results found for: "BNP"   Imaging:  No results found.       Latest Ref Rng & Units 01/26/2022    8:55 AM 09/25/2019   12:48 PM  PFT Results  FVC-Pre L 2.69  P 2.99   FVC-Predicted Pre % 108  P 116   FVC-Post L 2.76  P 2.96   FVC-Predicted Post % 110  P  115   Pre FEV1/FVC % % 71  P 68   Post FEV1/FCV % % 74  P 74   FEV1-Pre L 1.91  P 2.03   FEV1-Predicted Pre % 102  P 104   FEV1-Post L 2.05  P 2.18   DLCO uncorrected ml/min/mmHg 16.21  P 17.19   DLCO UNC% % 96  P 101   DLCO corrected ml/min/mmHg 15.88  P 17.19   DLCO COR %Predicted % 94  P 101   DLVA Predicted % 83  P 91   TLC L 4.10  P 5.18   TLC % Predicted % 92  P 116   RV % Predicted % 62  P 116     P Preliminary result    Lab Results  Component Value Date   NITRICOXIDE 17 12/06/2021         Assessment & Plan:   Arthralgia Worsening arthralgias over the past month or two. She also feels like the deformities in her hands have worsened. She had previous autoimmune labs with negative rheumatoid factor in 08/2019. Recommended we repeat ANA, RA factor, Anti DNA, CCP and refer her back to rheumatology. Her underlying ILD could very well be related to underlying autoimmune disease with CTD. HRCT from March was stable. She is currently using tylenol extra strength once a day for pain; advised her to increase to bid dosing and follow up with PCP while she waits for rheum appt.   Patient Instructions  Continue Albuterol inhaler 2 puffs every 6 hours as needed for shortness of breath or wheezing. Notify if symptoms persist despite rescue inhaler/neb use. Restart Zyrtec 1 tab daily for allergies during fall season  Continue Astelin nasal spray 2 sprays each nostril Twice daily for nasal drainage/congestion Continue Chlorpheniramine 4 mg tab over the counter At bedtime for cough/allergies  Continue Tessalon Perles 1 capsule Three times a day as needed for cough  Decrease protonix to 20 mg daily for one week then stop.  Use pepcid (famotidine) 20 mg over the counter 30 minutes prior to bedtime. Can use in AM as well if you need to  Labs today   High resolution CT chest    Follow up with Dr. Lamonte Sakai in February 2024 after CT chest. If symptoms do not improve or worsen, please contact office for sooner follow up or seek emergency care.     ILD (interstitial lung disease) ([place]) Very subtle changes in UIP pattern on HRCT from February without progression of disease. Given the stability of the changes, could be NSIP vs UIP. Her previous autoimmune workup was negative aside from weakly positive ANA. Repeat today. See above plan. PFTs were nl but with decline in TLC vs 2 years ago. Plan to repeat HRCT for further evaluation.   Asthma She has midflow reversibility on imaging and history  of very mild obstruction. No perceived benefit in the past from [place]. No evidence of bronchospasm on exam and FeNO at previous visit was nl. Encouraged her to trial albuterol when she has DOE or prior to exertion to see if she receives any benefit; she was agreeable to this plan. Encouraged her to increase activity, as tolerated.  GERD She is concerned that PPI use has caused worsening arthritis. We did review potential side effects of PPI and prons/cons of being on therapy. She did have improvement in her cough after increasing her dosing. She would really like to come off the PPI; we will wean her off per her wishes. Instructed her  to closely monitor her reflux, breathing, and cough. If she has worsening symptoms, we would encourage her to restart therapy. Can use H2 blocker for mild indigestion.     I spent 42 minutes of dedicated to the care of this patient on the date of this encounter to include pre-visit review of records, face-to-face time with the patient discussing conditions above, post visit ordering of testing, clinical documentation with the electronic health record, making appropriate referrals as documented, and communicating necessary findings to members of the patients care team.  Clayton Bibles, NP 01/26/2022  Pt aware and understands NP's role.

## 2022-01-26 NOTE — Assessment & Plan Note (Signed)
She is concerned that PPI use has caused worsening arthritis. We did review potential side effects of PPI and prons/cons of being on therapy. She did have improvement in her cough after increasing her dosing. She would really like to come off the PPI; we will wean her off per her wishes. Instructed her to closely monitor her reflux, breathing, and cough. If she has worsening symptoms, we would encourage her to restart therapy. Can use H2 blocker for mild indigestion.

## 2022-01-26 NOTE — Assessment & Plan Note (Addendum)
Very subtle changes in UIP pattern on HRCT from February without progression of disease. Given the stability of the changes, could be NSIP vs UIP. Her previous autoimmune workup was negative aside from weakly positive ANA. Repeat today. See above plan. PFTs were nl but with decline in TLC vs 2 years ago. Plan to repeat HRCT for further evaluation.

## 2022-01-26 NOTE — Assessment & Plan Note (Addendum)
She has midflow reversibility on imaging and history of very mild obstruction. No perceived benefit in the past from [place]. No evidence of bronchospasm on exam and FeNO at previous visit was nl. Encouraged her to trial albuterol when she has DOE or prior to exertion to see if she receives any benefit; she was agreeable to this plan. Encouraged her to increase activity, as tolerated.

## 2022-01-28 LAB — RHEUMATOID FACTOR: Rheumatoid fact SerPl-aCnc: <14 IU/mL (ref <14)

## 2022-01-28 LAB — ANTI-NUCLEAR AB-TITER (ANA TITER)
ANA TITER: 1:40 {titer} — ABNORMAL HIGH
ANA Titer 1: 1:40 {titer} — ABNORMAL HIGH

## 2022-01-28 LAB — ANTI-DNA ANTIBODY, DOUBLE-STRANDED: ds DNA Ab: <1 IU/mL

## 2022-01-28 LAB — ANA: Anti Nuclear Antibody (ANA): POSITIVE — AB

## 2022-01-28 LAB — CYCLIC CITRUL PEPTIDE ANTIBODY, IGG: Cyclic Citrullin Peptide Ab: <16 UNITS

## 2022-01-30 NOTE — Progress Notes (Signed)
Please notify pt that her ANA was very low positive; same as it was 2 years ago. This can be nonspecific and close to half the population have positive titers without autoimmune disease. Her RA factor and other labs were negative. Would still recommend she see rheum given her joint pains/ILD. Thanks.

## 2022-01-31 NOTE — Progress Notes (Signed)
Yes, that's who I requested on the referral! Thanks.

## 2022-02-06 ENCOUNTER — Ambulatory Visit
Admission: RE | Admit: 2022-02-06 | Discharge: 2022-02-06 | Disposition: A | Discharge status: Home / Self Care | Payer: Medicare HMO | Source: Ambulatory Visit | Attending: Family Medicine | Admitting: Family Medicine

## 2022-02-06 DIAGNOSIS — Z1231 Encounter for screening mammogram for malignant neoplasm of breast: Secondary | ICD-10-CM | POA: Diagnosis not present

## 2022-02-06 DIAGNOSIS — Z780 Asymptomatic menopausal state: Secondary | ICD-10-CM | POA: Diagnosis not present

## 2022-02-06 DIAGNOSIS — M8580 Other specified disorders of bone density and structure, unspecified site: Secondary | ICD-10-CM

## 2022-02-06 DIAGNOSIS — M85852 Other specified disorders of bone density and structure, left thigh: Secondary | ICD-10-CM | POA: Diagnosis not present

## 2022-02-08 ENCOUNTER — Other Ambulatory Visit: Payer: Self-pay | Admitting: Family Medicine

## 2022-02-08 ENCOUNTER — Ambulatory Visit (HOSPITAL_COMMUNITY): Payer: Medicare HMO

## 2022-02-08 DIAGNOSIS — R928 Other abnormal and inconclusive findings on diagnostic imaging of breast: Secondary | ICD-10-CM

## 2022-02-13 ENCOUNTER — Telehealth: Payer: Self-pay | Admitting: Nurse Practitioner

## 2022-02-13 NOTE — Telephone Encounter (Signed)
Patient would like to speak with the nurse regarding her medication and a referral that she said she was expecting.  She stated she still has not gotten a call.  Please advise and call patient to discuss at [phone]

## 2022-02-14 ENCOUNTER — Other Ambulatory Visit: Payer: Self-pay | Admitting: Cardiology

## 2022-02-14 NOTE — Telephone Encounter (Signed)
Called and left vociemail for patient that referral was sent out to Dr Valora Piccolo office and she can give them a call in regards to that referral. Nothing further needed

## 2022-02-15 ENCOUNTER — Ambulatory Visit
Admission: RE | Admit: 2022-02-15 | Discharge: 2022-02-15 | Disposition: A | Discharge status: Home / Self Care | Payer: Medicare HMO | Source: Ambulatory Visit | Attending: Family Medicine | Admitting: Family Medicine

## 2022-02-15 ENCOUNTER — Ambulatory Visit: Payer: Medicare HMO

## 2022-02-15 DIAGNOSIS — R922 Inconclusive mammogram: Secondary | ICD-10-CM | POA: Diagnosis not present

## 2022-02-15 DIAGNOSIS — R928 Other abnormal and inconclusive findings on diagnostic imaging of breast: Secondary | ICD-10-CM

## 2022-03-06 DIAGNOSIS — M79642 Pain in left hand: Secondary | ICD-10-CM | POA: Diagnosis not present

## 2022-03-06 DIAGNOSIS — R768 Other specified abnormal immunological findings in serum: Secondary | ICD-10-CM | POA: Diagnosis not present

## 2022-03-06 DIAGNOSIS — E569 Vitamin deficiency, unspecified: Secondary | ICD-10-CM | POA: Diagnosis not present

## 2022-03-06 DIAGNOSIS — R5383 Other fatigue: Secondary | ICD-10-CM | POA: Diagnosis not present

## 2022-03-06 DIAGNOSIS — M79672 Pain in left foot: Secondary | ICD-10-CM | POA: Diagnosis not present

## 2022-03-06 DIAGNOSIS — J849 Interstitial pulmonary disease, unspecified: Secondary | ICD-10-CM | POA: Diagnosis not present

## 2022-03-06 DIAGNOSIS — L853 Xerosis cutis: Secondary | ICD-10-CM | POA: Diagnosis not present

## 2022-03-06 DIAGNOSIS — Z6827 Body mass index (BMI) 27.0-27.9, adult: Secondary | ICD-10-CM | POA: Diagnosis not present

## 2022-03-06 DIAGNOSIS — E663 Overweight: Secondary | ICD-10-CM | POA: Diagnosis not present

## 2022-03-06 DIAGNOSIS — M79671 Pain in right foot: Secondary | ICD-10-CM | POA: Diagnosis not present

## 2022-03-06 DIAGNOSIS — M79641 Pain in right hand: Secondary | ICD-10-CM | POA: Diagnosis not present

## 2022-03-09 ENCOUNTER — Telehealth: Payer: Self-pay | Admitting: Nurse Practitioner

## 2022-03-09 MED ORDER — PANTOPRAZOLE SODIUM 20 MG PO TBEC: 20.0000 mg | DELAYED_RELEASE_TABLET | Freq: Every day | ORAL | 2 refills | Status: DC

## 2022-03-09 NOTE — Telephone Encounter (Signed)
Rx for '20mg'$  pantoprazole has been sent to preferred pharmacy for pt. Called and spoke with pt letting her know this had been done and she verbalized understanding. Nothing further needed.

## 2022-03-21 DIAGNOSIS — R69 Illness, unspecified: Secondary | ICD-10-CM | POA: Diagnosis not present

## 2022-03-21 DIAGNOSIS — G4733 Obstructive sleep apnea (adult) (pediatric): Secondary | ICD-10-CM | POA: Diagnosis not present

## 2022-03-21 DIAGNOSIS — Z23 Encounter for immunization: Secondary | ICD-10-CM | POA: Diagnosis not present

## 2022-03-21 DIAGNOSIS — G43009 Migraine without aura, not intractable, without status migrainosus: Secondary | ICD-10-CM | POA: Diagnosis not present

## 2022-03-21 DIAGNOSIS — I700 Atherosclerosis of aorta: Secondary | ICD-10-CM | POA: Diagnosis not present

## 2022-03-21 DIAGNOSIS — I129 Hypertensive chronic kidney disease with stage 1 through stage 4 chronic kidney disease, or unspecified chronic kidney disease: Secondary | ICD-10-CM | POA: Diagnosis not present

## 2022-03-21 DIAGNOSIS — K219 Gastro-esophageal reflux disease without esophagitis: Secondary | ICD-10-CM | POA: Diagnosis not present

## 2022-03-21 DIAGNOSIS — J849 Interstitial pulmonary disease, unspecified: Secondary | ICD-10-CM | POA: Diagnosis not present

## 2022-03-21 DIAGNOSIS — Z1321 Encounter for screening for nutritional disorder: Secondary | ICD-10-CM | POA: Diagnosis not present

## 2022-03-21 DIAGNOSIS — E785 Hyperlipidemia, unspecified: Secondary | ICD-10-CM | POA: Diagnosis not present

## 2022-03-21 DIAGNOSIS — Z9889 Other specified postprocedural states: Secondary | ICD-10-CM | POA: Diagnosis not present

## 2022-03-21 DIAGNOSIS — M810 Age-related osteoporosis without current pathological fracture: Secondary | ICD-10-CM | POA: Diagnosis not present

## 2022-03-21 DIAGNOSIS — Z79899 Other long term (current) drug therapy: Secondary | ICD-10-CM | POA: Diagnosis not present

## 2022-04-03 DIAGNOSIS — M1991 Primary osteoarthritis, unspecified site: Secondary | ICD-10-CM | POA: Diagnosis not present

## 2022-04-03 DIAGNOSIS — M79642 Pain in left hand: Secondary | ICD-10-CM | POA: Diagnosis not present

## 2022-04-03 DIAGNOSIS — M79671 Pain in right foot: Secondary | ICD-10-CM | POA: Diagnosis not present

## 2022-04-03 DIAGNOSIS — M79641 Pain in right hand: Secondary | ICD-10-CM | POA: Diagnosis not present

## 2022-04-03 DIAGNOSIS — J849 Interstitial pulmonary disease, unspecified: Secondary | ICD-10-CM | POA: Diagnosis not present

## 2022-04-03 DIAGNOSIS — M79672 Pain in left foot: Secondary | ICD-10-CM | POA: Diagnosis not present

## 2022-04-03 DIAGNOSIS — R768 Other specified abnormal immunological findings in serum: Secondary | ICD-10-CM | POA: Diagnosis not present

## 2022-04-03 DIAGNOSIS — M25559 Pain in unspecified hip: Secondary | ICD-10-CM | POA: Diagnosis not present

## 2022-04-09 DIAGNOSIS — H401132 Primary open-angle glaucoma, bilateral, moderate stage: Secondary | ICD-10-CM | POA: Diagnosis not present

## 2022-04-22 NOTE — Progress Notes (Unsigned)
Cardiology Office Note:    Date:  10/17/2021   ID:  NATTIE LAZENBY, DOB 05/01/1951, MRN [id number]  PCP:  Vernie Shanks, MD  Cardiologist:  Donato Heinz, MD  Electrophysiologist:  None   Referring MD: Vernie Shanks, MD   Chief Complaint  Patient presents with   Hypertension    History of Present Illness:    Anna Arnold is a 71 y.o. female with a hx of fibromyalgia, hyperlipidemia who presents for follow-up.  She was referred by Dr. Jacelyn Grip for evaluation of palpitations, initially seen on 12/09/2019.  She reports that her palpitations started 6 weeks prior.  Occurs 2-3 times per week, feels like her heart is racing.  Will last for 2 minutes or so and resolve.  Always occurs at night, especially when she is lying on her left side.  She denies any lightheadedness or syncope during episodes, but does report she has lightheadedness sometimes during the day.  Also reports she has been having some swelling in her left ankle.  In addition, she states that she had some chest pain one week prior.  Describes as left-sided dull aching pain.  Lasted for 1 to 1.5 hours.  For exercise she goes for walks.  Typically does about 10,000 steps per day between walking and her job working at a preschool.  Reports she has noted progressive fatigue and shortness of breath with exertion.  No smoking history.  Family history includes father died of MI at age 81, though she thinks he had a stent at some point prior to this.  Her oldest brother died of MI at age 73.  She is taking atorvastatin 10 mg twice weekly, has been unable to tolerate more frequent dosing.   Lexiscan Myoview on 12/16/2019 showed normal perfusion, EF 58%.  Echocardiogram on 12/28/2019 showed normal biventricular function, no significant valvular disease.  Zio patch x14 days showed one 10 beat run of NSVT, otherwise no significant abnormalities.  Calcium score 0 on 03/21/2020.  Echocardiogram 11/15/2021 showed normal biventricular function,  no significant valvular disease.  Since last clinic visit, she reports that she is doing well.  Reports occasional chest pain, reports tenderness when presses on chest.  Denies any dyspnea, headedness, syncope, lower extreme edema, or palpitations.  Reports chronic left lower extremity swelling.  Does yard work and occasional walks for exercise.    Past Medical History:  Diagnosis Date   Anxiety    Arthritis    Cataracts, bilateral    Cervical spondylosis    Degenerative disc disease, lumbar    Depression    Fibromyalgia    GERD (gastroesophageal reflux disease)    Glaucoma    Hypertension    Migraines    Osteopenia    Seasonal allergies    Sleep disorder     Past Surgical History:  Procedure Laterality Date   bunionectomy Right 2008   CATARACT EXTRACTION Bilateral 2015-2016   CESAREAN SECTION  1982   GLAUCOMA SURGERY Bilateral 2013   HAMMER TOE SURGERY Right 2008   ROTATOR CUFF REPAIR Right 2012    Current Medications: Current Meds  Medication Sig   acetaminophen (TYLENOL) 500 MG tablet Take 500 mg by mouth every 6 (six) hours as needed.   albuterol (VENTOLIN HFA) 108 (90 Base) MCG/ACT inhaler Inhale 2 puffs into the lungs every 6 (six) hours as needed for wheezing or shortness of breath.   alendronate (FOSAMAX) 70 MG tablet Take 70 mg by mouth once a week. Take with  a full glass of water on an empty stomach.   atorvastatin (LIPITOR) 10 MG tablet Take 1 tablet (10 mg total) by mouth daily.   buPROPion (WELLBUTRIN SR) 100 MG 12 hr tablet Take 100 mg by mouth daily.   Carboxymethylcellulose Sodium (REFRESH PLUS OP)    Cholecalciferol (VITAMIN D3) 1000 units CAPS Take by mouth.   losartan (COZAAR) 100 MG tablet Take 100 mg by mouth daily.   meclizine (ANTIVERT) 25 MG tablet Take 25 mg by mouth 3 (three) times daily as needed for dizziness.   Multiple Minerals-Vitamins (CALCIUM-MAGNESIUM-ZINC-D3 PO) Take by mouth.   pantoprazole (PROTONIX) 20 MG tablet pantoprazole 20 mg  tablet,delayed release   SUMAtriptan (IMITREX) 100 MG tablet Take 100 mg by mouth every 2 (two) hours as needed for migraine. Take 1/2 tablet for migraine. May repeat in 2 hours if headache persists or recurs.   [DISCONTINUED] losartan (COZAAR) 50 MG tablet Take 50 mg by mouth daily.   Current Facility-Administered Medications for the 10/17/21 encounter (Office Visit) with Donato Heinz, MD  Medication   0.9 %  sodium chloride infusion     Allergies:   Brimonidine, Netarsudil, Netarsudil dimesylate, Amitriptyline, Atenolol, Augmentin [amoxicillin-pot clavulanate], Biaxin [clarithromycin], Ceftin [cefuroxime axetil], Chlorthalidone, Citalopram hydrobromide, Erythromycin, Fluoxetine, Lexapro [escitalopram oxalate], Meloxicam, Potassium chloride, Reglan [metoclopramide], Sertraline hcl, Simvastatin, Singulair [montelukast sodium], Travoprost, Viibryd [vilazodone hcl], Zegerid [omeprazole], and Brinzolamide   Social History   Socioeconomic History   Marital status: Married    Spouse name: Not on file   Number of children: Not on file   Years of education: Not on file   Highest education level: Not on file  Occupational History   Not on file  Tobacco Use   Smoking status: Never   Smokeless tobacco: Never  Substance and Sexual Activity   Alcohol use: No   Drug use: No   Sexual activity: Not on file  Other Topics Concern   Not on file  Social History Narrative   Not on file   Social Determinants of Health   Financial Resource Strain: Not on file  Food Insecurity: Not on file  Transportation Needs: Not on file  Physical Activity: Not on file  Stress: Not on file  Social Connections: Not on file     Family History: The patient'sfamily history includes Cancer in her mother; Depression in her brother and mother; Heart attack in her brother; Heart disease in her father. There is no history of Colon cancer, Colon polyps, Esophageal cancer, Rectal cancer, or Stomach  cancer.  ROS:   Please see the history of present illness.    All other systems reviewed and are negative.  EKGs/Labs/Other Studies Reviewed:    The following studies were reviewed today:  EKG:   10/17/21: Normal sinus rhythm, rate 91, incomplete right bundle branch block, right axis deviation  04/24/2022: Normal sinus rhythm, rate 74, incomplete right bundle branch block, right axis deviation   Recent Labs: No results found for requested labs within last 8760 hours.  Recent Lipid Panel No results found for: CHOL, TRIG, HDL, CHOLHDL, VLDL, LDLCALC, LDLDIRECT  Physical Exam:    VS:  BP (!) 144/90   Pulse 91   Ht 5' (1.524 m)   Wt 138 lb (62.6 kg)   SpO2 96%   BMI 26.95 kg/m     Wt Readings from Last 3 Encounters:  10/17/21 138 lb (62.6 kg)  04/03/21 136 lb (61.7 kg)  01/19/21 134 lb 9.6 oz (61.1 kg)  GEN: Well nourished, well developed in no acute distress HEENT: Normal NECK: No JVD; No carotid bruits CARDIAC: RRR, no murmurs, rubs, gallops RESPIRATORY:  Clear to auscultation without rales, wheezing or rhonchi  ABDOMEN: Soft, non-tender, non-distended MUSCULOSKELETAL:  No edema; No deformity  SKIN: Warm and dry NEUROLOGIC:  Alert and oriented x 3 PSYCHIATRIC:  Normal affect   ASSESSMENT:    1. DOE (dyspnea on exertion)   2. Leg edema, left   3. Palpitations   4. Essential hypertension   5. Hyperlipidemia, unspecified hyperlipidemia type      PLAN:    Chest pain/dyspnea on exertion: Chest pain is atypical in description, but does have CAD risk factors (age, family history, hyperlipidemia).  In addition she reports progressive dyspnea on exertion, which could represent anginal equivalent.  Lexiscan Myoview on 12/16/2019 showed normal perfusion, EF 58%.  Echocardiogram on 12/28/2019 showed normal biventricular function, no significant valvular disease.  Calcium score 0 on 03/21/2020. Reports chest pain has resolved, no further cardiac work-up  recommended. -Chest CT consistent with ILD, follows with pulmonology -CT chest 07/07/2021 showed coronary calcifications, along with aortic valve calcifications.  Echocardiogram 11/15/2021 showed normal biventricular function, no significant valvular disease.  Left lower extremity edema: She reports history of DVT, has asymmetric swelling in left leg.  Lower extremity duplex 10/18/2021 showed no DVT.  Palpitations: Zio patch x14 days showed one 10 beat run of NSVT, otherwise no significant abnormalities.  Reports palpitations have improved.  Hyperlipidemia: On atorvastatin 10 mg three times per week.  LDL 111 on 11/06/2019.  Calcium score 0 on 03/21/2020.  CT chest 07/07/2021 showed coronary calcifications.  Atorvastatin increased to 10 mg daily.  LDL 95 on 10/13/2021.  She reports myalgias on atorvastatin, will switch to rosuvastatin 5 mg daily.  Check fasting lipid panel in 2 months  Hypertension: On losartan 100 mg daily.  Appears reasonably controlled from her home BP log  RTC in 6 months   Medication Adjustments/Labs and Tests Ordered: Current medicines are reviewed at length with the patient today.  Concerns regarding medicines are outlined above.  Orders Placed This Encounter  Procedures   EKG 12-Lead   ECHOCARDIOGRAM COMPLETE   VAS [place] LOWER EXTREMITY VENOUS (DVT)    No orders of the defined types were placed in this encounter.    Patient Instructions  Medication Instructions:  Your physician recommends that you continue on your current medications as directed. Please refer to the Current Medication list given to you today.  *If you need a refill on your cardiac medications before your next appointment, please call your pharmacy*  Testing/Procedures: Your physician has requested that you have an echocardiogram. Echocardiography is a painless test that uses sound waves to create images of your heart. It provides your doctor with information about the size and shape of your heart  and how well your heart's chambers and valves are working. This procedure takes approximately one hour. There are no restrictions for this procedure.  Your physician has requested that you have a lower extremity venous duplex. This test is an ultrasound of the veins in the legs. It looks at venous blood flow that carries blood from the heart to the legs . Allow one hour for a Lower Venous exam. There are no restrictions or special instructions.  Follow-Up: At [facility], you and your health needs are our priority.  As part of our continuing mission to provide you with exceptional heart care, we have created designated Provider Care Teams.  These Care  Teams include your primary Cardiologist (physician) and Advanced Practice Providers (APPs -  Physician Assistants and Nurse Practitioners) who all work together to provide you with the care you need, when you need it.  We recommend signing up for the patient portal called "MyChart".  Sign up information is provided on this After Visit Summary.  MyChart is used to connect with patients for Virtual Visits (Telemedicine).  Patients are able to view lab/test results, encounter notes, upcoming appointments, etc.  Non-urgent messages can be sent to your provider as well.   To learn more about what you can do with MyChart, go to [email or web address].    Your next appointment:   6 month(s)  The format for your next appointment:   In Person  Provider:   Donato Heinz, MD {    Important Information About Sugar         Signed, Donato Heinz, MD  10/17/2021 3:01 PM    [place]

## 2022-04-24 ENCOUNTER — Ambulatory Visit: Payer: Medicare HMO | Attending: Cardiology | Admitting: Cardiology

## 2022-04-24 ENCOUNTER — Encounter: Payer: Self-pay | Admitting: Cardiology

## 2022-04-24 VITALS — BP 138/88 | HR 74 | Ht 59.0 in | Wt 134.0 lb

## 2022-04-24 DIAGNOSIS — R002 Palpitations: Secondary | ICD-10-CM | POA: Diagnosis not present

## 2022-04-24 DIAGNOSIS — R079 Chest pain, unspecified: Secondary | ICD-10-CM

## 2022-04-24 DIAGNOSIS — E785 Hyperlipidemia, unspecified: Secondary | ICD-10-CM

## 2022-04-24 DIAGNOSIS — I10 Essential (primary) hypertension: Secondary | ICD-10-CM | POA: Diagnosis not present

## 2022-04-24 MED ORDER — ROSUVASTATIN CALCIUM 5 MG PO TABS: 5.0000 mg | ORAL_TABLET | Freq: Every day | ORAL | 3 refills | Status: DC

## 2022-04-24 NOTE — Patient Instructions (Signed)
Medication Instructions:  STOP atorvastatin START rosuvastatin (Crestor) 5 mg daily  *If you need a refill on your cardiac medications before your next appointment, please call your pharmacy*   Lab Work: Please return for FASTING labs in 2 months (Lipid)  Our in office lab hours are Monday-Friday 8:00-4:00, closed for lunch 12:45-1:45 pm.  No appointment needed.  [facility] locations:   [place] ([place]) - [address] [place] [address] - [address] [place] - [address] ([facility])  Follow-Up: At [facility], you and your health needs are our priority.  As part of our continuing mission to provide you with exceptional heart care, we have created designated Provider Care Teams.  These Care Teams include your primary Cardiologist (physician) and Advanced Practice Providers (APPs -  Physician Assistants and Nurse Practitioners) who all work together to provide you with the care you need, when you need it.  We recommend signing up for the patient portal called "MyChart".  Sign up information is provided on this After Visit Summary.  MyChart is used to connect with patients for Virtual Visits (Telemedicine).  Patients are able to view lab/test results, encounter notes, upcoming appointments, etc.  Non-urgent messages can be sent to your provider as well.   To learn more about what you can do with MyChart, go to [email or web address].    Your next appointment:   12 month(s)  The format for your next appointment:   In Person  Provider:   Donato Heinz, MD

## 2022-05-08 DIAGNOSIS — M25552 Pain in left hip: Secondary | ICD-10-CM | POA: Diagnosis not present

## 2022-05-17 DIAGNOSIS — M25552 Pain in left hip: Secondary | ICD-10-CM | POA: Diagnosis not present

## 2022-05-24 ENCOUNTER — Other Ambulatory Visit: Payer: Self-pay | Admitting: Nurse Practitioner

## 2022-05-29 DIAGNOSIS — M25512 Pain in left shoulder: Secondary | ICD-10-CM | POA: Diagnosis not present

## 2022-05-30 DIAGNOSIS — M25552 Pain in left hip: Secondary | ICD-10-CM | POA: Diagnosis not present

## 2022-05-30 DIAGNOSIS — M545 Low back pain, unspecified: Secondary | ICD-10-CM | POA: Diagnosis not present

## 2022-06-07 DIAGNOSIS — M25552 Pain in left hip: Secondary | ICD-10-CM | POA: Diagnosis not present

## 2022-06-07 DIAGNOSIS — M545 Low back pain, unspecified: Secondary | ICD-10-CM | POA: Diagnosis not present

## 2022-06-12 ENCOUNTER — Telehealth: Payer: Self-pay | Admitting: Cardiology

## 2022-06-12 DIAGNOSIS — M791 Myalgia, unspecified site: Secondary | ICD-10-CM

## 2022-06-12 DIAGNOSIS — M25552 Pain in left hip: Secondary | ICD-10-CM | POA: Diagnosis not present

## 2022-06-12 DIAGNOSIS — M545 Low back pain, unspecified: Secondary | ICD-10-CM | POA: Diagnosis not present

## 2022-06-12 NOTE — Telephone Encounter (Signed)
Pt c/o medication issue:  1. Name of Medication:rosuvastatin (CRESTOR) 5 MG tablet    2. How are you currently taking this medication (dosage and times per day)?   Take 1 tablet (5 mg total) by mouth daily.    3. Are you having a reaction (difficulty breathing--STAT)? no  4. What is your medication issue? Patient states she is having muscle pain and joint pain.

## 2022-06-12 NOTE — Telephone Encounter (Signed)
Had myalgias with atorvastatin as well. Recommend referral to lipid clinic.

## 2022-06-12 NOTE — Telephone Encounter (Signed)
Called pt to relay the message. Pt aware the referral has been made and no changes to her medications at this time. She verbalized understanding.

## 2022-06-13 DIAGNOSIS — R059 Cough, unspecified: Secondary | ICD-10-CM | POA: Diagnosis not present

## 2022-06-13 DIAGNOSIS — B349 Viral infection, unspecified: Secondary | ICD-10-CM | POA: Diagnosis not present

## 2022-06-13 DIAGNOSIS — R52 Pain, unspecified: Secondary | ICD-10-CM | POA: Diagnosis not present

## 2022-06-13 DIAGNOSIS — J029 Acute pharyngitis, unspecified: Secondary | ICD-10-CM | POA: Diagnosis not present

## 2022-06-13 DIAGNOSIS — Z6826 Body mass index (BMI) 26.0-26.9, adult: Secondary | ICD-10-CM | POA: Diagnosis not present

## 2022-06-13 DIAGNOSIS — U071 COVID-19: Secondary | ICD-10-CM | POA: Diagnosis not present

## 2022-06-21 ENCOUNTER — Institutional Professional Consult (permissible substitution): Payer: Medicare HMO | Admitting: Neurology

## 2022-06-22 DIAGNOSIS — M25552 Pain in left hip: Secondary | ICD-10-CM | POA: Diagnosis not present

## 2022-06-25 DIAGNOSIS — M25552 Pain in left hip: Secondary | ICD-10-CM | POA: Diagnosis not present

## 2022-06-29 DIAGNOSIS — E785 Hyperlipidemia, unspecified: Secondary | ICD-10-CM | POA: Diagnosis not present

## 2022-06-30 LAB — LIPID PANEL
Chol/HDL Ratio: 3.1 ratio (ref 0.0–4.4)
Cholesterol, Total: 197 mg/dL (ref 100–199)
HDL: 64 mg/dL (ref >39)
LDL Chol Calc (NIH): 110 mg/dL — ABNORMAL HIGH (ref 0–99)
Triglycerides: 131 mg/dL (ref 0–149)
VLDL Cholesterol Cal: 23 mg/dL (ref 5–40)

## 2022-07-02 ENCOUNTER — Telehealth: Payer: Self-pay | Admitting: Cardiology

## 2022-07-02 ENCOUNTER — Encounter: Payer: Self-pay | Admitting: Pharmacist

## 2022-07-02 DIAGNOSIS — M25552 Pain in left hip: Secondary | ICD-10-CM | POA: Diagnosis not present

## 2022-07-02 DIAGNOSIS — M545 Low back pain, unspecified: Secondary | ICD-10-CM | POA: Diagnosis not present

## 2022-07-02 NOTE — Telephone Encounter (Signed)
Ok to hold statin until she is seen by lipid clinic 2/16

## 2022-07-02 NOTE — Telephone Encounter (Signed)
Patient stated she has more pain in her body since taking rosuvastatin. She also stated she thinks her lipids were elevated because she has been eating bacon, sausage, sweets, and ice cream. She goes to lipid clinic on 2/16, but wants to know if she can stop taking statin until then. We may leave message on cell or MyChart. Please advise on statin.

## 2022-07-02 NOTE — Telephone Encounter (Signed)
Pt c/o medication issue:  1. Name of Medication: rosuvastatin (CRESTOR) 5 MG tablet   2. How are you currently taking this medication (dosage and times per day)? As written  3. Are you having a reaction (difficulty breathing--STAT)? No   4. What is your medication issue? Patient stated that she do not want to go on 10 MG tablet and that she was in less pain when she was taking atorvastatin. Wants to get off of statin meds due to pain in joints

## 2022-07-02 NOTE — Telephone Encounter (Signed)
LVM regarding statin. Also placed in [place].

## 2022-07-03 NOTE — Telephone Encounter (Signed)
Agree with plan, would discontinue statin until she is seen by lipid clinic

## 2022-07-07 ENCOUNTER — Ambulatory Visit (HOSPITAL_BASED_OUTPATIENT_CLINIC_OR_DEPARTMENT_OTHER)
Admission: RE | Admit: 2022-07-07 | Discharge: 2022-07-07 | Disposition: A | Discharge status: Home / Self Care | Payer: Medicare HMO | Source: Ambulatory Visit | Attending: Nurse Practitioner | Admitting: Nurse Practitioner

## 2022-07-07 DIAGNOSIS — J849 Interstitial pulmonary disease, unspecified: Secondary | ICD-10-CM | POA: Diagnosis not present

## 2022-07-07 DIAGNOSIS — J479 Bronchiectasis, uncomplicated: Secondary | ICD-10-CM | POA: Diagnosis not present

## 2022-07-13 ENCOUNTER — Ambulatory Visit: Payer: Medicare HMO | Attending: Internal Medicine | Admitting: Pharmacist Clinician (PhC)/ Clinical Pharmacy Specialist

## 2022-07-13 ENCOUNTER — Encounter: Payer: Self-pay | Admitting: Pharmacist Clinician (PhC)/ Clinical Pharmacy Specialist

## 2022-07-13 VITALS — BP 159/96

## 2022-07-13 DIAGNOSIS — Z1389 Encounter for screening for other disorder: Secondary | ICD-10-CM

## 2022-07-13 DIAGNOSIS — I10 Essential (primary) hypertension: Secondary | ICD-10-CM

## 2022-07-13 DIAGNOSIS — E7849 Other hyperlipidemia: Secondary | ICD-10-CM

## 2022-07-13 MED ORDER — NEXLETOL 180 MG PO TABS: 180.0000 mg | ORAL_TABLET | Freq: Every day | ORAL | 0 refills | Status: DC

## 2022-07-13 NOTE — Assessment & Plan Note (Addendum)
Assessment: BP is uncontrolled in office BP 158/99 mmHg;  above the goal (<130/80). 4 home readings in past 2 weeks average 135/83  Tolerates losartan well without any side effects Denies SOB, palpitation, chest pain, headaches,or swelling Reiterated the importance of regular exercise and low salt diet   Plan:  Continue taking losartan Patient to keep record of BP readings with heart rate and report to [place] at the next visit Patient to follow up with PharmD in 1 month for follow up  Labs ordered today:  none

## 2022-07-13 NOTE — Patient Instructions (Signed)
Follow up appointment: March 19 at 10 am  Go to the lab in 2-3 weeks to check you uric acid level  Take your meds as follows:  Start Nexletol 180 mg once daily  Continue with all other medications  Check your blood pressure at home 3-4 times each week and keep record of the readings.  Hypertension "High blood pressure"  Hypertension is often called "The Silent Killer." It rarely causes symptoms until it is extremely  high or has done damage to other organs in the body. For this reason, you should have your  blood pressure checked regularly by your physician. We will check your blood pressure  every time you see a provider at one of our offices.   Your blood pressure reading consists of two numbers. Ideally, blood pressure should be  below 120/80. The first ("top") number is called the systolic pressure. It measures the  pressure in your arteries as your heart beats. The second ("bottom") number is called the diastolic pressure. It measures the pressure in your arteries as the heart relaxes between beats.  The benefits of getting your blood pressure under control are enormous. A 10-point  reduction in systolic blood pressure can reduce your risk of stroke by 27% and heart failure by 28%  Your blood pressure goal is < 130/80  To check your pressure at home you will need to:  1. Sit up in a chair, with feet flat on the floor and back supported. Do not cross your ankles or legs. 2. Rest your left arm so that the cuff is about heart level. If the cuff goes on your upper arm,  then just relax the arm on the table, arm of the chair or your lap. If you have a wrist cuff, we  suggest relaxing your wrist against your chest (think of it as Pledging the Flag with the  wrong arm).  3. Place the cuff snugly around your arm, about 1 inch above the crook of your elbow. The  cords should be inside the groove of your elbow.  4. Sit quietly, with the cuff in place, for about 5 minutes. After that  5 minutes press the power  button to start a reading. 5. Do not talk or move while the reading is taking place.  6. Record your readings on a sheet of paper. Although most cuffs have a memory, it is often  easier to see a pattern developing when the numbers are all in front of you.  7. You can repeat the reading after 1-3 minutes if it is recommended  Make sure your bladder is empty and you have not had caffeine or tobacco within the last 30 min  Always bring your blood pressure log with you to your appointments. If you have not brought your monitor in to be double checked for accuracy, please bring it to your next appointment.  You can find a list of quality blood pressure cuffs at [email or web address]

## 2022-07-13 NOTE — Progress Notes (Signed)
Office Visit    Patient Name: Anna Arnold Date of Encounter: 07/13/2022  Primary Care Provider:  Vernie Shanks, MD (Inactive) Primary Cardiologist:  Donato Heinz, MD  Chief Complaint    Hyperlipidemia   Significant Past Medical History   CAD Calcium seen on CT,   palpitations Wore 10 day Zio, no significant abnormalities  HTN Home log shows reasonable control, on losartan 100 mg  DVT      Allergies  Allergen Reactions   Brimonidine Other (See Comments)    swelling    Ceftin [Cefuroxime Axetil]     Diarrhea and vomiting   Netarsudil Other (See Comments)   Netarsudil Dimesylate Dermatitis, Itching, Swelling and Other (See Comments)    In eyes, itching     Viibryd [Vilazodone Hcl]     agitation   Amitriptyline     sedation   Atenolol     fatigue   Augmentin [Amoxicillin-Pot Clavulanate] Nausea Only    GI pain   Biaxin [Clarithromycin] Nausea Only    GI pain   Brinzolamide Anxiety   Chlorthalidone     cramping   Citalopram Hydrobromide Other (See Comments)    fatigue   Erythromycin Rash   Fluoxetine Other (See Comments)    agitation   Lexapro [Escitalopram Oxalate]     Agitation and nausea   Meloxicam Nausea Only    Nausea    Potassium Chloride     Abdominal pain   Reglan [Metoclopramide]     Abdominal pain   Sertraline Hcl Other (See Comments)    fatigue   Simvastatin     Joint and muscle pain   Singulair [Montelukast Sodium]     Severe agitation, pruritis    Travoprost    Zegerid [Omeprazole] Nausea Only    nausea    History of Present Illness    Anna Arnold is a 72 y.o. female patient of Dr Gardiner Rhyme, in the office today to discuss options for lowering cholesterol.   She has previously failed 3 different statins because of myalgias.  While she has never had a stroke or MI, a recent coronary CT showed calcifications in her left main and LAD  Insurance Carrier:  [employer]  LDL Cholesterol goal:  LDL< 70  Current Medications:   none   Previously tried:  atorvastatin, rosuvastatin, simvastatin  Family Hx:  father died of MI at age 31, though she thinks he had a stent at some point prior to this. Her oldest brother died of MI at age 53.  Mother died lung cancer, had hypertension; another brother with hypertension; one daughter, borderline glaucoma  Social Hx: Tobacco: no Alcohol: no   Diet:  even mix of home and eating out; home mostly mostly produce , not much beef or pork; works with [employer]; lots of fish; has cravings for sweets;   Exercise: has stationary bike, but not using regularly; looking into Silver Sneakers  Adherence Assessment  Do you ever forget to take your medication? []$ Yes [x]$ No  Do you ever skip doses due to side effects? []$ Yes [x]$ No  Do you have trouble affording your medicines? []$ Yes [x]$ No  Are you ever unable to pick up your medication due to transportation difficulties? []$ Yes [x]$ No  Do you ever stop taking your medications because you don't believe they are helping? []$ Yes [x]$ No   Adherence strategy: 7 day pill minder   Accessory Clinical Findings   Lab Results  Component Value Date   CHOL 197 06/29/2022   HDL  64 06/29/2022   LDLCALC 110 (H) 06/29/2022   TRIG 131 06/29/2022   CHOLHDL 3.1 06/29/2022    Lab Results  Component Value Date   ALT 25 07/06/2019   AST 31 07/06/2019   ALKPHOS 68 07/06/2019   BILITOT 0.4 07/06/2019   Lab Results  Component Value Date   CREATININE 1.06 (H) 07/06/2019   BUN 13 07/06/2019   NA 138 07/06/2019   K 4.0 07/06/2019   CL 104 07/06/2019   CO2 24 07/06/2019   No results found for: "HGBA1C"  Home Medications    Current Outpatient Medications  Medication Sig Dispense Refill   alendronate (FOSAMAX) 70 MG tablet Take 70 mg by mouth once a week. Take with a full glass of water on an empty stomach.     azelastine (ASTELIN) 0.1 % nasal spray Place 2 sprays into both nostrils 2 (two) times daily. Use in each nostril as directed  30 mL 5   buPROPion (WELLBUTRIN SR) 100 MG 12 hr tablet Take 100 mg by mouth daily.     Cholecalciferol (VITAMIN D3) 1000 units CAPS Take by mouth.     Coenzyme Q10 (COQ10) 200 MG CAPS      Gluc-Chonn-MSM-Boswellia-Vit D (GLUCOSAMINE CHOND TRIPLE/VIT D PO) Take 1 tablet by mouth daily.     losartan (COZAAR) 100 MG tablet Take 100 mg by mouth daily.     Multiple Minerals-Vitamins (CALCIUM-MAGNESIUM-ZINC-D3 PO) Take by mouth.     Omega-3 Fatty Acids (SALMON) 200 MG CAPS Take 1 capsule by mouth.     pantoprazole (PROTONIX) 20 MG tablet TAKE 1 TABLET BY MOUTH DAILY 30 tablet 5   Turmeric (QC TUMERIC COMPLEX PO) Take by mouth.     acetaminophen (TYLENOL) 500 MG tablet Take 500 mg by mouth every 6 (six) hours as needed.     albuterol (VENTOLIN HFA) 108 (90 Base) MCG/ACT inhaler Inhale 2 puffs into the lungs every 6 (six) hours as needed for wheezing or shortness of breath. 18 g 5   meclizine (ANTIVERT) 25 MG tablet Take 25 mg by mouth 3 (three) times daily as needed for dizziness.     SUMAtriptan (IMITREX) 100 MG tablet Take 100 mg by mouth every 2 (two) hours as needed for migraine. Take 1/2 tablet for migraine. May repeat in 2 hours if headache persists or recurs.     Current Facility-Administered Medications  Medication Dose Route Frequency Provider Last Rate Last Admin   0.9 %  sodium chloride infusion  500 mL Intravenous Continuous Nandigam, Venia Minks, MD         Assessment & Plan    Essential hypertension Assessment: BP is uncontrolled in office BP 158/99 mmHg;  above the goal (<130/80). 4 home readings in past 2 weeks average 135/83  Tolerates losartan well without any side effects Denies SOB, palpitation, chest pain, headaches,or swelling Reiterated the importance of regular exercise and low salt diet   Plan:  Continue taking losartan Patient to keep record of BP readings with heart rate and report to [place] at the next visit Patient to follow up with PharmD in 1 month for follow up   Labs ordered today:  none   Hyperlipidemia Assessment: Patient with ASCVD not at LDL goal of < 70 Most recent LDL 110 on 06/29/22 Not able to tolerate statins secondary to myalgias Reviewed options for lowering LDL cholesterol, including ezetimibe, PCSK-9 inhibitors, bempedoic acid and inclisiran.  Discussed mechanisms of action, dosing, side effects, potential decreases in LDL cholesterol and costs.  Also reviewed potential options for patient assistance.  Plan: Patient agreeable to starting bempedoic acid - she was given samples for 3 weeks to be sure she can tolerate.  If she does well and uric acid stable, will submit request to insurance for PA.   Repeat labs   Uric acid - in 2 weeks to monitor for gout Lipid - 3 months  Liver function - 3 months Patient was given information on [employer] in office today.   Tommy Medal, PharmD CPP [facility] [address] [place]  [place], [place] [address] [phone]  07/13/2022, 5:03 PM

## 2022-07-13 NOTE — Assessment & Plan Note (Addendum)
Assessment: Patient with ASCVD not at LDL goal of < 70 Most recent LDL 110 on 06/29/22 Not able to tolerate statins secondary to myalgias Reviewed options for lowering LDL cholesterol, including ezetimibe, PCSK-9 inhibitors, bempedoic acid and inclisiran.  Discussed mechanisms of action, dosing, side effects, potential decreases in LDL cholesterol and costs.  Also reviewed potential options for patient assistance.  Plan: Patient agreeable to starting bempedoic acid - she was given samples for 3 weeks to be sure she can tolerate.  If she does well and uric acid stable, will submit request to insurance for PA.   Repeat labs   Uric acid - in 2 weeks to monitor for gout Lipid - 3 months  Liver function - 3 months Patient was given information on [employer] in office today.

## 2022-07-16 ENCOUNTER — Ambulatory Visit: Payer: Medicare HMO | Admitting: Neurology

## 2022-07-16 ENCOUNTER — Encounter: Payer: Self-pay | Admitting: Neurology

## 2022-07-16 VITALS — BP 153/88 | HR 94 | Ht <= 58 in | Wt 130.0 lb

## 2022-07-16 DIAGNOSIS — G478 Other sleep disorders: Secondary | ICD-10-CM

## 2022-07-16 DIAGNOSIS — G4701 Insomnia due to medical condition: Secondary | ICD-10-CM

## 2022-07-16 DIAGNOSIS — R0602 Shortness of breath: Secondary | ICD-10-CM | POA: Diagnosis not present

## 2022-07-16 DIAGNOSIS — J849 Interstitial pulmonary disease, unspecified: Secondary | ICD-10-CM | POA: Insufficient documentation

## 2022-07-16 DIAGNOSIS — I700 Atherosclerosis of aorta: Secondary | ICD-10-CM

## 2022-07-16 DIAGNOSIS — G8929 Other chronic pain: Secondary | ICD-10-CM | POA: Diagnosis not present

## 2022-07-16 DIAGNOSIS — R4584 Anhedonia: Secondary | ICD-10-CM

## 2022-07-16 NOTE — Progress Notes (Signed)
SLEEP MEDICINE CLINIC    Provider:  Larey Seat, MD  Primary Care Physician:  Marda Stalker, PA-C [place] [address]     Referring Provider: Marda Stalker, [place],  [place] [address]          Chief Complaint according to patient   Patient presents with:     New Patient (Initial Visit)           HISTORY OF PRESENT ILLNESS:  Anna Arnold is a 72 y.o. female patient who is seen upon a third time referral on 07/16/2022 from PCP [facility] for another " Sleep Study ".  Chief concern according to patient : This is my third consultation with Anna Arnold,  again for sleep apnea.   The patient as suffered from insomnia in the past, the inability to stay asleep but recently this improved , she gets 7 hours of sleep. She has 2 bathroom breaks at night, and the second often ends her sleep at an undesirable early point.  On both nights where she gets 7 hours of sleep she feels great.  She noticed a difference in mood, in cognitive function, and overall wellbeing. We have evaluated the patient in 2017 for chronic insomnia related to pain in her case cervicalgia. This pain is still present and the degenerative disc disease is not going away.  She was then again referred for his chronic insomnia and another sleep test was performed in January 2020.  At that time she was diagnosed with mild obstructive sleep apnea which was strongly positional dependent.  Sleeping not on her back was the main recommendation and outcome of that study. Was also recommended to address pain with the appropriate provider and anxiety and depression as a contributor to insomnia with an appropriate provider.   She has interstitial Lung disease and Asthma- SOB- and is seen by Dr. Gardiner Rhyme, Cardiology. A CT Calcium Scan which confirmed coronary calcification - and her cholesterol medication was just adjusted. No changes in interstitial Lung disease were found.  Dr Loletta Specter at [facility].      I have the pleasure of seeing Anna Arnold 07/16/22 a right-handed female with a possible sleep disorder.    HST 2020 was positive for mild apnea, apnea was strongly supine dependent.  2017 sleep study , 2007 sleep study .    Sleep relevant medical history: Nocturia/ 2-3,  cervical spine DDD, no injury. Anterior fusion. Family medical /sleep history: no other family member on CPAP with OSA, insomnia, sleep walkers.    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 Family status is married , with one adult daughter, 2  grandchildren.  No pets.  Tobacco use: passive exposure .   ETOH use - none,  Caffeine intake in form of Coffee( in PM one cup-) Soda( /) Tea ( /) or energy drinks Exercise as tolerated, SOB limitation.    Sleep habits are as follows: The patient's dinner time is between 6.30 PM. The patient goes to bed at 11-12 PM and continues to sleep for 6 hours, wakes for 2 bathroom breaks, the first time at 1-2 AM.   The preferred sleep position is laterally , with the support of one  pillows.  Dreams are reportedly frequent.   The patient wakes up spontaneously before  her alarm. 5.45  AM is the usual rise time. She reports not feeling refreshed or restored in AM, with symptoms such as dry mouth, morning headaches, and residual fatigue. Sleeps better on the couch. Had oral surgery   Naps are not taken- never    Quoted from 2017:  Anna Arnold underwent a sleep study at home in 2013 but previously was evaluated on 12/14/2005 at the [facility]. She was diagnosed with mild apnea , the AHI was 8.8/h but exacerbated during REM to 34. Oxygen nadir was 80% and she was snoring.   The patient reports no problems going to sleep, but she feels exhausted throughout the day. Her Epworth sleepiness score was endorsed at 10 points and her  fatigue severity is highly elevated at 50 points. She does not endorse a significant level of depression on the geriatric depression score 2 out of 15. She has used a fit bit and feels that it indicated that she gets only 3-5 hours of nocturnal sleep. She seems to be very restless throughout the night.     Review of Systems: Out of a complete 14 system review, the patient complains of only the following symptoms, and all other reviewed systems are negative.:  Fatigue, sleepiness , snoring, fragmented sleep, Insomnia, RLS, Nocturia    How likely are you to doze in the following situations: 0 = not likely, 1 = slight chance, 2 = moderate chance, 3 = high chance   Sitting and Reading? Watching Television? Sitting inactive in a public place (theater or meeting)? As a passenger in a car for an hour without a break? Lying down in the afternoon when circumstances permit? Sitting and talking to someone? Sitting quietly after lunch without alcohol? In a car, while stopped for a few minutes in traffic?   Total =2 / 24 points   FSS endorsed at 35/ 63 points.   GDS - 5/ 15 for depression.   Social History   Socioeconomic History   Marital status: Married    Spouse name: Not on file   Number of children: Not on file   Years of education: Not on file   Highest education level: Not on file  Occupational History   Not on file  Tobacco Use   Smoking status: Never    Passive exposure: Past   Smokeless tobacco: Never  Substance and Sexual Activity   Alcohol use: No   Drug use: No   Sexual activity: Not on file  Other Topics Concern   Not on file  Social History Narrative   Not on file   Social Determinants of Health   Financial Resource Strain: Not on file  Food Insecurity: Not on file  Transportation Needs: Not on file  Physical Activity: Not on file  Stress: Not on file  Social Connections: Not on file    Family History  Problem Relation Age of Onset   Cancer Mother     Depression Mother    Heart disease Father    Heart attack Brother    Depression Brother    Colon cancer Neg Hx    Colon polyps Neg Hx    Esophageal cancer Neg Hx    Rectal cancer Neg Hx    Stomach cancer Neg Hx     Past Medical History:  Diagnosis Date   Anxiety    Arthritis    Cataracts, bilateral    Cervical spondylosis    Degenerative disc disease, lumbar    Depression    Fibromyalgia  GERD (gastroesophageal reflux disease)    Glaucoma    Hypertension    Migraines    Osteopenia    Seasonal allergies    Sleep disorder     Past Surgical History:  Procedure Laterality Date   bunionectomy Right 2008   CATARACT EXTRACTION Bilateral 2015-2016   CESAREAN SECTION  1982   GLAUCOMA SURGERY Bilateral 2013   HAMMER TOE SURGERY Right 2008   ROTATOR CUFF REPAIR Right 2012     Current Outpatient Medications on File Prior to Visit  Medication Sig Dispense Refill   ACETAMINOPHEN PO Take 650 mg by mouth as needed.     albuterol (VENTOLIN HFA) 108 (90 Base) MCG/ACT inhaler Inhale 2 puffs into the lungs every 6 (six) hours as needed for wheezing or shortness of breath. 18 g 5   alendronate (FOSAMAX) 70 MG tablet Take 70 mg by mouth once a week. Take with a full glass of water on an empty stomach.     azelastine (ASTELIN) 0.1 % nasal spray Place 2 sprays into both nostrils 2 (two) times daily. Use in each nostril as directed 30 mL 5   Bempedoic Acid (NEXLETOL) 180 MG TABS Take 1 tablet (180 mg total) by mouth daily. 21 tablet 0   buPROPion (WELLBUTRIN SR) 100 MG 12 hr tablet Take 100 mg by mouth daily.     Cholecalciferol (VITAMIN D3) 1000 units CAPS Take by mouth.     Coenzyme Q10 (COQ10) 200 MG CAPS      Gluc-Chonn-MSM-Boswellia-Vit D (GLUCOSAMINE CHOND TRIPLE/VIT D PO) Take 1 tablet by mouth daily.     losartan (COZAAR) 100 MG tablet Take 100 mg by mouth daily.     meclizine (ANTIVERT) 25 MG tablet Take 25 mg by mouth 3 (three) times daily as needed for dizziness.     Multiple  Minerals-Vitamins (CALCIUM-MAGNESIUM-ZINC-D3 PO) Take by mouth.     Omega-3 Fatty Acids (SALMON) 200 MG CAPS Take 1 capsule by mouth.     pantoprazole (PROTONIX) 20 MG tablet TAKE 1 TABLET BY MOUTH DAILY 30 tablet 5   SUMAtriptan (IMITREX) 100 MG tablet Take 100 mg by mouth every 2 (two) hours as needed for migraine. Take 1/2 tablet for migraine. May repeat in 2 hours if headache persists or recurs.     Turmeric (QC TUMERIC COMPLEX PO) Take by mouth.     Polyethyl Glycol-Propyl Glycol (SYSTANE HYDRATION PF OP)      Current Facility-Administered Medications on File Prior to Visit  Medication Dose Route Frequency Provider Last Rate Last Admin   0.9 %  sodium chloride infusion  500 mL Intravenous Continuous Nandigam, Venia Minks, MD        Allergies  Allergen Reactions   Brimonidine Other (See Comments)    swelling    Ceftin [Cefuroxime Axetil]     Diarrhea and vomiting   Netarsudil Other (See Comments)   Netarsudil Dimesylate Dermatitis, Itching, Swelling and Other (See Comments)    In eyes, itching     Viibryd [Vilazodone Hcl]     agitation   Amitriptyline     sedation   Atenolol     fatigue   Augmentin [Amoxicillin-Pot Clavulanate] Nausea Only    GI pain   Biaxin [Clarithromycin] Nausea Only    GI pain   Brinzolamide Anxiety   Chlorthalidone     cramping   Citalopram Hydrobromide Other (See Comments)    fatigue   Erythromycin Rash   Fluoxetine Other (See Comments)    agitation   Lexapro [  Escitalopram Oxalate]     Agitation and nausea   Meloxicam Nausea Only    Nausea    Potassium Chloride     Abdominal pain   Reglan [Metoclopramide]     Abdominal pain   Sertraline Hcl Other (See Comments)    fatigue   Simvastatin     Joint and muscle pain   Singulair [Montelukast Sodium]     Severe agitation, pruritis    Travoprost    Zegerid [Omeprazole] Nausea Only    nausea     DIAGNOSTIC DATA (LABS, IMAGING, TESTING) - I reviewed patient records, labs, notes, testing  and imaging myself where available.  Lab Results  Component Value Date   WBC 4.4 12/06/2021   HGB 14.1 12/06/2021   HCT 41.5 12/06/2021   MCV 91.5 12/06/2021   PLT 236.0 12/06/2021      Component Value Date/Time   NA 138 07/06/2019 1057   K 4.0 07/06/2019 1057   CL 104 07/06/2019 1057   CO2 24 07/06/2019 1057   GLUCOSE 116 (H) 07/06/2019 1057   BUN 13 07/06/2019 1057   CREATININE 1.06 (H) 07/06/2019 1057   CALCIUM 9.7 07/06/2019 1057   PROT 6.7 07/06/2019 1057   ALBUMIN 4.2 07/06/2019 1057   AST 31 07/06/2019 1057   ALT 25 07/06/2019 1057   ALKPHOS 68 07/06/2019 1057   BILITOT 0.4 07/06/2019 1057   GFRNONAA 54 (L) 07/06/2019 1057   GFRAA >60 07/06/2019 1057   Lab Results  Component Value Date   CHOL 197 06/29/2022   HDL 64 06/29/2022   LDLCALC 110 (H) 06/29/2022   TRIG 131 06/29/2022   CHOLHDL 3.1 06/29/2022   No results found for: "HGBA1C" Lab Results  Component Value Date   VITAMINB12 460 05/04/2008   No results found for: "TSH"  COMPARISON:  High-resolution chest CT 07/07/2021.   FINDINGS: Cardiovascular: Heart size is normal. There is no significant pericardial fluid, thickening or pericardial calcification. There is aortic atherosclerosis, as well as atherosclerosis of the great vessels of the mediastinum and the coronary arteries, including calcified atherosclerotic plaque in the left main and left anterior descending coronary arteries. Calcifications of the aortic valve.   Mediastinum/Nodes: No pathologically enlarged mediastinal or hilar lymph nodes. Please note that accurate exclusion of hilar adenopathy is limited on noncontrast CT scans. Large hiatal hernia. No axillary lymphadenopathy.   Lungs/Pleura: High-resolution images again demonstrate areas of peripheral predominant septal thickening, subpleural reticulation, mild cylindrical bronchiectasis, and peripheral bronchiolectasis. These findings have a definitive craniocaudal gradient,  and demonstrate minimal progression compared to the prior study from 07/07/2021. Inspiratory and expiratory imaging demonstrates minimal air trapping indicative of small airways disease, in addition to partial collapse of the trachea and mainstem bronchi, indicative of tracheobronchomalacia. No frank honeycombing. Scattered areas of mucoid impaction within terminal bronchioles are noted. No confluent consolidative airspace disease. No pleural effusions. No large suspicious appearing pulmonary nodules or masses are noted.   Upper Abdomen: Aortic atherosclerosis. Multiple small well-defined low-attenuation lesions scattered throughout the hepatic parenchyma, incompletely characterized on today's noncontrast CT examination, but similar to the prior study, statistically likely to represent cysts (no imaging follow-up recommended).   Musculoskeletal: Chronic compression fractures are noted at T11 and T12 with 20% loss of anterior vertebral body height at both levels. There are no aggressive appearing lytic or blastic lesions noted in the visualized portions of the skeleton.   IMPRESSION: 1. The appearance of the lungs is very similar to the prior study, once again categorized as probable  usual interstitial pneumonia (UIP) per current ATS guidelines. Given the overall stability, the possibility of fibrotic phase nonspecific interstitial pneumonia also warrants consideration. 2. Mild air trapping indicative of mild small airways disease with mild tracheobronchomalacia. 3. Aortic atherosclerosis, in addition to left main and left anterior descending coronary artery disease. Assessment for potential risk factor modification, dietary therapy or pharmacologic therapy may be warranted, if clinically indicated. 4. There are calcifications of the aortic valve. Echocardiographic correlation for evaluation of potential valvular dysfunction may be warranted if clinically indicated.   Aortic  Atherosclerosis (ICD10-I70.0).     Electronically Signed   By: Vinnie Langton M.D.   On: 07/07/2022 12:19  PHYSICAL EXAM:  Today's Vitals   07/16/22 1244 07/16/22 1248  BP: (!) 163/90 (!) 153/88  Pulse: 98 94  Weight: 130 lb (59 kg)   Height: 4' 9"$  (1.448 m)    Body mass index is 28.13 kg/m.   Wt Readings from Last 3 Encounters:  07/16/22 130 lb (59 kg)  04/24/22 134 lb (60.8 kg)  01/26/22 138 lb (62.6 kg)     Ht Readings from Last 3 Encounters:  07/16/22 4' 9"$  (1.448 m)  04/24/22 4' 11"$  (1.499 m)  01/26/22 5' (1.524 m)      General: The patient is awake, alert and appears not in acute distress. The patient is well groomed. Head: Normocephalic, atraumatic. Neck is supple. Mallampati 2, septal deviation.   neck circumference:13.5  inches . Nasal airflow barely patent.  Retrognathia is  seen.  Dental status: biological. Sinusitis.  Cardiovascular:  Regular rate and cardiac rhythm by pulse,  without distended neck veins. Respiratory: wheezing in center chest.  Skin:  Without evidence of ankle edema, or rash. Trunk: The patient's posture is erect.   NEUROLOGIC EXAM: The patient is awake and alert, oriented to place and time.   Memory subjective described as impaired for searching for names.  Attention span & concentration ability appears normal.  Speech is fluent,  without  dysarthria, dysphonia or aphasia.  Mood and affect are appropriate.   Cranial nerves: no loss of smell or taste reported  Pupils are disrounded, [facility] center- less reactive to light. Funduscopic exam deferred.  Extraocular movements in vertical and horizontal planes were intact and without nystagmus. No Diplopia. Visual fields by finger perimetry are intact. Hearing was intact to soft voice and finger rubbing.    Facial sensation intact to fine touch.  Facial motor strength is symmetric and tongue and uvula move midline.  Neck ROM : rotation, tilt and flexion extension were normal for age and  shoulder shrug was symmetrical.    Motor exam:  Symmetric bulk, tone and ROM.   Normal tone without cog wheeling, symmetric  and strong grip strength .   Sensory:  Fine touch and vibration were  normal.  Proprioception tested in the upper extremities was normal.   Coordination: Rapid alternating movements in the fingers/hands were of normal speed.  The Finger-to-nose maneuver was intact without evidence of ataxia, dysmetria or tremor.   Gait and station: Patient could rise unassisted from a seated position, walked without assistive device.  Stance is of normal width/ base and the patient turned with 3 steps.  Toe and heel walk were deferred.  Deep tendon reflexes: in the  upper and lower extremities are symmetric and intact.  Babinski response was deferred.    ASSESSMENT AND PLAN 72 y.o. year old Caucasian retired female here with: I reviewed paper charts back to 2007 to compare  sleep study results.     1) SOB,  non restorative sleep, and known OSA - not on CPAP. Retest to see if new comorbididties affect degree and type of apnea. Her tracker device stated she did not get more than 60 minutes of REM sleep , but that may be 15% of normal sleep time.  She is on  Wellbutrin ,  a REM suppressant med as well.   2) small airway disease, restrictive/ Interstitial  lung disease, SOB-  also had COVID in January 2024, but SOB has been present longer. If our sleep study shows hypoxemia, I will involve her pulmonologist to treat .  Reviewed echo, PFT and CT chest.   3) insomnia related to chronic pain and cervicalgia. Light sleeper, husband snores loudly and keeps her awake. I suggested to send him for sleep testing and move to another bedroom.   4) chronic anhedonia, loss of joy - missing her work. Wellbutrin has helped .     I plan to follow up either personally or through our NP within 5 months.   I would like to thank Marda Stalker, PA-C and Marda Stalker, [place],  [place] [address] for allowing me to meet with and to take care of this pleasant patient.   CC: I will share my notes with PCP.  After spending a total time of  45  minutes face to face and additional time for physical and neurologic examination, review of laboratory studies,  personal review of imaging studies, reports and results of other testing and review of referral information / records as far as provided in visit,   Electronically signed by: Larey Seat, MD 07/16/2022 1:12 PM  [facility] Board certified by The [employer] of Sleep Medicine and Diplomate of the [employer] of Sleep Medicine. Board certified In Neurology through the [place], Fellow of the [employer] of Neurology. Medical Director of [employer].

## 2022-07-18 DIAGNOSIS — M25552 Pain in left hip: Secondary | ICD-10-CM | POA: Diagnosis not present

## 2022-07-20 DIAGNOSIS — H409 Unspecified glaucoma: Secondary | ICD-10-CM | POA: Diagnosis not present

## 2022-07-20 DIAGNOSIS — R42 Dizziness and giddiness: Secondary | ICD-10-CM | POA: Diagnosis not present

## 2022-07-20 DIAGNOSIS — Z008 Encounter for other general examination: Secondary | ICD-10-CM | POA: Diagnosis not present

## 2022-07-20 DIAGNOSIS — Z8249 Family history of ischemic heart disease and other diseases of the circulatory system: Secondary | ICD-10-CM | POA: Diagnosis not present

## 2022-07-20 DIAGNOSIS — I2510 Atherosclerotic heart disease of native coronary artery without angina pectoris: Secondary | ICD-10-CM | POA: Diagnosis not present

## 2022-07-20 DIAGNOSIS — G43909 Migraine, unspecified, not intractable, without status migrainosus: Secondary | ICD-10-CM | POA: Diagnosis not present

## 2022-07-20 DIAGNOSIS — I129 Hypertensive chronic kidney disease with stage 1 through stage 4 chronic kidney disease, or unspecified chronic kidney disease: Secondary | ICD-10-CM | POA: Diagnosis not present

## 2022-07-20 DIAGNOSIS — E785 Hyperlipidemia, unspecified: Secondary | ICD-10-CM | POA: Diagnosis not present

## 2022-07-20 DIAGNOSIS — M4800 Spinal stenosis, site unspecified: Secondary | ICD-10-CM | POA: Diagnosis not present

## 2022-07-20 DIAGNOSIS — K219 Gastro-esophageal reflux disease without esophagitis: Secondary | ICD-10-CM | POA: Diagnosis not present

## 2022-07-20 DIAGNOSIS — J4489 Other specified chronic obstructive pulmonary disease: Secondary | ICD-10-CM | POA: Diagnosis not present

## 2022-07-20 DIAGNOSIS — M1990 Unspecified osteoarthritis, unspecified site: Secondary | ICD-10-CM | POA: Diagnosis not present

## 2022-07-20 DIAGNOSIS — M810 Age-related osteoporosis without current pathological fracture: Secondary | ICD-10-CM | POA: Diagnosis not present

## 2022-07-24 ENCOUNTER — Ambulatory Visit: Payer: Medicare HMO | Admitting: Emergency Medicine

## 2022-07-24 ENCOUNTER — Encounter: Payer: Self-pay | Admitting: Emergency Medicine

## 2022-07-24 VITALS — BP 136/82 | HR 86 | Temp 98.3°F | Ht <= 58 in | Wt 130.6 lb

## 2022-07-24 DIAGNOSIS — J3089 Other allergic rhinitis: Secondary | ICD-10-CM | POA: Diagnosis not present

## 2022-07-24 DIAGNOSIS — J849 Interstitial pulmonary disease, unspecified: Secondary | ICD-10-CM | POA: Diagnosis not present

## 2022-07-24 DIAGNOSIS — J4520 Mild intermittent asthma, uncomplicated: Secondary | ICD-10-CM | POA: Diagnosis not present

## 2022-07-24 NOTE — Progress Notes (Signed)
Subjective:    Patient ID: Anna Arnold, female    DOB: 12-Mar-1951, 72 y.o.   MRN: [id number]  HPI  ROV 01/19/21 --72 year old woman, history of spondylosis and DJD, question autoimmune disease although rheumatological evaluation has been reassuring.  She has mild obstruction on PFT, suspected mixed disease with some very mild peripheral septal thickening and associated inflammatory change without frank honeycomb.  At our last visit based on her mild obstruction we did a trial of Breo to see if she would get benefit.  Initially she thought it was helping but in the end it really did not change her breathing significantly so we stopped it. She also had some UA irritation, difficulty swallowing. Made her GERD worse, even on PPI.  Reports that she had COVID-19 in July 2022, fatigue, myalgias and weakness or her symptoms.  No real cough or URI symptoms.  She has been dealing with ophthalmology for glaucoma.  She has albuterol but has only ever used it once. She believes the high humidity makes her breathing more difficult. She is not getting a lot of exercise these days due to knee and back pain.   ROV 07/24/22 --follow-up visit for 72 year old woman with a history of DJD, spondylosis and question autoimmune disease w ANA 1:40 (reassuring rheumatology evaluation).  Mild mixed disease on pulmonary function testing.  I have followed her for this as well as subtle interstitial changes on CT scan of the chest, not in a UIP pattern.  Did not get a significant clinical benefit from [facility], now off scheduled ICS/LABA. Never needs albuterol. She will still get some exertional SOB in the the summer months. She is having a lot of throat clearing, dry cough. Has astelin, stopped using it because it gave her some blurred vision.   High-resolution CT chest 07/07/2022 reviewed by me shows overall stable findings, some peripheral predominant septal thickening and subpleural reticulation associated with very mild cylindrical  bronchiectasis.  Question some evidence of tracheobronchomalacia on inspiratory and expiratory films.  No frank honeycomb change.  No significant adenopathy.   Review of Systems As per HPI     Objective:   Physical Exam Vitals:   07/24/22 0837  BP: 136/82  Pulse: 86  Temp: 98.3 F (36.8 C)  TempSrc: Oral  SpO2: 97%  Weight: 130 lb 9.6 oz (59.2 kg)  Height: '4\' 9"'$  (1.448 m)   Gen: Pleasant, well-nourished, in no distress,  normal affect  ENT: No lesions,  mouth clear,  oropharynx clear, no postnasal drip  Neck: No JVD, no stridor  Lungs: No use of accessory muscles, no crackles or wheezing on normal respiration, no wheeze on forced expiration  Cardiovascular: RRR, heart sounds normal, no murmur or gallops, no peripheral edema  Musculoskeletal: stable deformity of PIP joints #2 and 3 on R, #2 on L, no cyanosis or clubbing  Neuro: alert, awake, non focal  Skin: Warm, no lesions or rash      Assessment & Plan:  ILD (interstitial lung disease) (HCC) No significant change on her most recent chest imaging, now with over a year of stability.  Suspect that this was related to COVID-19.  Will continue to follow, next CT in 1 year  Asthma Mild obstruction based on her flow-volume loop, PFTs overall stable.  She did not benefit from [facility], plan to continue to keep albuterol available as needed  Allergic rhinitis Some cough, upper airway irritation.  She is doing nasal saline rinses, Astelin as needed.  If this progresses, persist  then we could consider ramping up therapy, adding antihistamine or nasal steroid.    Baltazar Apo, MD, PhD 07/24/2022, 9:04 AM [facility] [phone] or if no answer [phone]

## 2022-07-24 NOTE — Patient Instructions (Addendum)
We reviewed your CT scan of the chest today.  There has been no significant change compared with your prior. We will plan to repeat a high-resolution CT scan of the chest in February 2025. Keep albuterol available to use 2 puffs if needed for shortness of breath, chest tightness, wheezing. We will not restart any scheduled inhaler medication at this time Continue your nasal saline rinses as you have been doing them. Keep Astelin nasal spray available use 2 sprays each nostril if needed for congestion, drainage and cough Follow with Dr. Lamonte Sakai in 12 months or sooner if you have any problems.

## 2022-07-24 NOTE — Assessment & Plan Note (Addendum)
Mild obstruction based on her flow-volume loop, PFTs overall stable.  She did not benefit from [facility], plan to continue to keep albuterol available as needed

## 2022-07-24 NOTE — Addendum Note (Signed)
Addended by: Loma Sousa on: 07/24/2022 09:17 AM   Modules accepted: Orders

## 2022-07-24 NOTE — Assessment & Plan Note (Signed)
Some cough, upper airway irritation.  She is doing nasal saline rinses, Astelin as needed.  If this progresses, persist then we could consider ramping up therapy, adding antihistamine or nasal steroid.

## 2022-07-24 NOTE — Assessment & Plan Note (Signed)
No significant change on her most recent chest imaging, now with over a year of stability.  Suspect that this was related to COVID-19.  Will continue to follow, next CT in 1 year

## 2022-07-25 DIAGNOSIS — M25552 Pain in left hip: Secondary | ICD-10-CM | POA: Diagnosis not present

## 2022-07-26 ENCOUNTER — Telehealth: Payer: Self-pay | Admitting: Pharmacist Clinician (PhC)/ Clinical Pharmacy Specialist

## 2022-07-26 NOTE — Telephone Encounter (Signed)
Patient called to report that since starting nexletol she feels like she did with morning sickness.  Has nausea, lightheadedness, fatigue.  Has taken for about 10 days now.  Advised that this could be reaction, she should stop medication for about a week, until she feels better.  Then she can re-challenge with the medication.  If the symptoms come back then it will be most likely be from the medication and we can look to further options.    Patient agreeable with plan, will reach out after she takes another week of medication.

## 2022-07-31 DIAGNOSIS — I10 Essential (primary) hypertension: Secondary | ICD-10-CM | POA: Diagnosis not present

## 2022-07-31 DIAGNOSIS — R5383 Other fatigue: Secondary | ICD-10-CM | POA: Diagnosis not present

## 2022-07-31 DIAGNOSIS — R4189 Other symptoms and signs involving cognitive functions and awareness: Secondary | ICD-10-CM | POA: Diagnosis not present

## 2022-08-02 ENCOUNTER — Encounter: Payer: Self-pay | Admitting: Cardiology

## 2022-08-02 ENCOUNTER — Encounter: Payer: Self-pay | Admitting: Emergency Medicine

## 2022-08-02 NOTE — Telephone Encounter (Signed)
I agree with her plan to increase her exercise routine. No restrictions there. Would recommend that she have her albuterol inhaler available to use if she has a change in her breathing.

## 2022-08-03 ENCOUNTER — Telehealth: Payer: Self-pay | Admitting: Neurology

## 2022-08-03 DIAGNOSIS — N183 Chronic kidney disease, stage 3 unspecified: Secondary | ICD-10-CM | POA: Diagnosis not present

## 2022-08-03 DIAGNOSIS — M810 Age-related osteoporosis without current pathological fracture: Secondary | ICD-10-CM | POA: Diagnosis not present

## 2022-08-03 DIAGNOSIS — H409 Unspecified glaucoma: Secondary | ICD-10-CM | POA: Diagnosis not present

## 2022-08-03 DIAGNOSIS — J4520 Mild intermittent asthma, uncomplicated: Secondary | ICD-10-CM | POA: Diagnosis not present

## 2022-08-03 DIAGNOSIS — K219 Gastro-esophageal reflux disease without esophagitis: Secondary | ICD-10-CM | POA: Diagnosis not present

## 2022-08-03 DIAGNOSIS — I10 Essential (primary) hypertension: Secondary | ICD-10-CM | POA: Diagnosis not present

## 2022-08-03 DIAGNOSIS — E785 Hyperlipidemia, unspecified: Secondary | ICD-10-CM | POA: Diagnosis not present

## 2022-08-03 DIAGNOSIS — E7800 Pure hypercholesterolemia, unspecified: Secondary | ICD-10-CM | POA: Diagnosis not present

## 2022-08-03 NOTE — Telephone Encounter (Signed)
Split- aetna medicare pending uploaded notes on the portal.  HST- aetna medicare no auth req

## 2022-08-06 NOTE — Telephone Encounter (Signed)
Checked status on the portal it is still pending.  

## 2022-08-07 ENCOUNTER — Telehealth: Payer: Self-pay | Admitting: Pharmacist Clinician (PhC)/ Clinical Pharmacy Specialist

## 2022-08-07 MED ORDER — NEXLETOL 180 MG PO TABS: 180.0000 mg | ORAL_TABLET | Freq: Every day | ORAL | 0 refills | Status: DC

## 2022-08-07 NOTE — Telephone Encounter (Signed)
Patient called to report that she has re-started Nexeltol.  She is still having problems with lightheadedness, nausea and blurred vision, but she doesn't think it is necessarily the medication.  She would like to continue until follow up appointment next week.   Will leave 1 week sample at front desk for her.

## 2022-08-08 NOTE — Telephone Encounter (Signed)
Checked status on the portal it is still pending.  

## 2022-08-10 DIAGNOSIS — M25512 Pain in left shoulder: Secondary | ICD-10-CM | POA: Diagnosis not present

## 2022-08-14 ENCOUNTER — Encounter: Payer: Self-pay | Admitting: Student

## 2022-08-14 ENCOUNTER — Ambulatory Visit: Payer: Medicare HMO | Attending: Cardiovascular Disease | Admitting: Student

## 2022-08-14 DIAGNOSIS — I10 Essential (primary) hypertension: Secondary | ICD-10-CM | POA: Diagnosis not present

## 2022-08-14 DIAGNOSIS — Z1389 Encounter for screening for other disorder: Secondary | ICD-10-CM | POA: Diagnosis not present

## 2022-08-14 NOTE — Progress Notes (Signed)
Patient ID: Anna Arnold                 DOB: 1950/08/14                      MRN: [id number]      HPI: Anna Arnold is a 72 y.o. female referred by Dr. Gardiner Arnold to HTN clinic. PMH is significant for CAD, HTN, hx of DVT.  Patient presented today for HTN follow up and home BP cuff validation. Home BP found out to be inaccurate. She is lots of shoulder pain and unable to sleep at night. She takes 1 gm Tylenol per day for pain. Have been following weight watchers and lost 8 lbs in last couple months. Also started going to [facility]. Feels dizzy and nauseated so held [facility] for a week symptoms persisted so she restarted Nexletol. Home BP ~135/80 heart rate 75-80.    SBP/DBP  HR  1st one on home meter  147/86 91  1st on office cuff  144/81   2nd on home cuff  146/83 85  2nd on office cuff  130/78   3rd on home cuff  148/88 80  Rd on office cuff  136/81 85   Current HTN meds: losartan 100 mg daily  Previously tried: atenolol- fatigue, chlorthalidone- cramps  BP goal: <130/80  Family History:  Father -died from   Social History:  Alcohol:none Smoking: never   Diet: on weight watchers   Exercise: started with wellness coach at [employer] cardio and lower body resistance exercise 3-4 times per week  Home BP readings: ~ 135/80 heart rate 75-80   Wt Readings from Last 3 Encounters:  07/24/22 130 lb 9.6 oz (59.2 kg)  07/16/22 130 lb (59 kg)  04/24/22 134 lb (60.8 kg)   BP Readings from Last 3 Encounters:  08/14/22 138/78  07/24/22 136/82  07/16/22 (!) 153/88   Pulse Readings from Last 3 Encounters:  08/14/22 85  07/24/22 86  07/16/22 94    Renal function: CrCl cannot be calculated (Patient's most recent lab result is older than the maximum 21 days allowed.).  Past Medical History:  Diagnosis Date   Anxiety    Arthritis    Cataracts, bilateral    Cervical spondylosis    Degenerative disc disease, lumbar    Depression    Fibromyalgia    GERD (gastroesophageal reflux  disease)    Glaucoma    Hypertension    Migraines    Osteopenia    Seasonal allergies    Sleep disorder     Current Outpatient Medications on File Prior to Visit  Medication Sig Dispense Refill   ACETAMINOPHEN PO Take 650 mg by mouth as needed.     albuterol (VENTOLIN HFA) 108 (90 Base) MCG/ACT inhaler Inhale 2 puffs into the lungs every 6 (six) hours as needed for wheezing or shortness of breath. 18 g 5   alendronate (FOSAMAX) 70 MG tablet Take 70 mg by mouth once a week. Take with a full glass of water on an empty stomach.     azelastine (ASTELIN) 0.1 % nasal spray Place 2 sprays into both nostrils 2 (two) times daily. Use in each nostril as directed 30 mL 5   Bempedoic Acid (NEXLETOL) 180 MG TABS Take 1 tablet (180 mg total) by mouth daily. 7 tablet 0   buPROPion (WELLBUTRIN SR) 100 MG 12 hr tablet Take 100 mg by mouth daily.     Cholecalciferol (VITAMIN D3) 1000  units CAPS Take by mouth.     Coenzyme Q10 (COQ10) 200 MG CAPS      Gluc-Chonn-MSM-Boswellia-Vit D (GLUCOSAMINE CHOND TRIPLE/VIT D PO) Take 1 tablet by mouth daily.     losartan (COZAAR) 100 MG tablet Take 100 mg by mouth daily.     meclizine (ANTIVERT) 25 MG tablet Take 25 mg by mouth 3 (three) times daily as needed for dizziness.     Multiple Minerals-Vitamins (CALCIUM-MAGNESIUM-ZINC-D3 PO) Take by mouth.     Omega-3 Fatty Acids (SALMON) 200 MG CAPS Take 1 capsule by mouth.     pantoprazole (PROTONIX) 20 MG tablet TAKE 1 TABLET BY MOUTH DAILY 30 tablet 5   Polyethyl Glycol-Propyl Glycol (SYSTANE HYDRATION PF OP)      SUMAtriptan (IMITREX) 100 MG tablet Take 100 mg by mouth every 2 (two) hours as needed for migraine. Take 1/2 tablet for migraine. May repeat in 2 hours if headache persists or recurs.     Turmeric (QC TUMERIC COMPLEX PO) Take by mouth.     Current Facility-Administered Medications on File Prior to Visit  Medication Dose Route Frequency Provider Last Rate Last Admin   0.9 %  sodium chloride infusion  500 mL  Intravenous Continuous Anna Arnold, Anna Minks, MD        Allergies  Allergen Reactions   Brimonidine Other (See Comments)    swelling    Ceftin [Cefuroxime Axetil]     Diarrhea and vomiting   Netarsudil Other (See Comments)   Netarsudil Dimesylate Dermatitis, Itching, Swelling and Other (See Comments)    In eyes, itching     Viibryd [Vilazodone Hcl]     agitation   Amitriptyline     sedation   Atenolol     fatigue   Augmentin [Amoxicillin-Pot Clavulanate] Nausea Only    GI pain   Biaxin [Clarithromycin] Nausea Only    GI pain   Brinzolamide Anxiety   Chlorthalidone     cramping   Citalopram Hydrobromide Other (See Comments)    fatigue   Erythromycin Rash   Fluoxetine Other (See Comments)    agitation   Lexapro [Escitalopram Oxalate]     Agitation and nausea   Meloxicam Nausea Only    Nausea    Potassium Chloride     Abdominal pain   Reglan [Metoclopramide]     Abdominal pain   Sertraline Hcl Other (See Comments)    fatigue   Simvastatin     Joint and muscle pain   Singulair [Montelukast Sodium]     Severe agitation, pruritis    Travoprost    Zegerid [Omeprazole] Nausea Only    nausea    Blood pressure 138/78, pulse 85, SpO2 99 %.   Assessment/Plan:  1. Hypertension -  Essential hypertension Assessment: BP is un/controlled in office BP 144/81 mmHg upon repeating it was 130/78 heart rate 85 Home BP readings: ~ 135/80 heart rate 75-80  Home cuff validated it was not accurate  Tolerates losartan 100 mg well without any side effects  Denies SOB, palpitation, chest pain, headaches,or swelling She gets dizzy and nauseated  she thinks it is not from medications she may have some other ear problem that need further assessment from ENT specialist  Reiterated the importance of regular exercise and low salt diet   Plan:  No medications changes  Continue taking losartan 100 mg daily  Patient to get new home BP cuff and bring in for validation at the next OV   Patient to keep record of BP readings  with heart rate and report to [place] at the next visit Patient to see PharmD in 4 weeks for follow up       Thank you  Anna Arnold, Pharm.D [facility] of [facility] [address] [place], [place] [address]  Phone: [phone]; Fax: [phone]

## 2022-08-14 NOTE — Telephone Encounter (Signed)
[employer] medicare denied the Split.  [employer]na medicare no auth req for [employer]   Left a voicemail for patient to call back to schedule her HST.

## 2022-08-14 NOTE — Telephone Encounter (Signed)
Patient called back.   HST- [employer] medicare no auth req   She is scheduled at [facility] for 09/04/22 at 11 AM.  Mailed packet to the patient.

## 2022-08-14 NOTE — Patient Instructions (Signed)
No changes made by your pharmacist Maloree Uplinger, PharmD at today's visit:     Bring all of your meds, your BP cuff and your record of home blood pressures to your next appointment.    HOW TO TAKE YOUR BLOOD PRESSURE AT HOME  Rest 5 minutes before taking your blood pressure.  Don't smoke or drink caffeinated beverages for at least 30 minutes before. Take your blood pressure before (not after) you eat. Sit comfortably with your back supported and both feet on the floor (don't cross your legs). Elevate your arm to heart level on a table or a desk. Use the proper sized cuff. It should fit smoothly and snugly around your bare upper arm. There should be enough room to slip a fingertip under the cuff. The bottom edge of the cuff should be 1 inch above the crease of the elbow. Ideally, take 3 measurements at one sitting and record the average.  Important lifestyle changes to control high blood pressure  Intervention  Effect on the BP  Lose extra pounds and watch your waistline Weight loss is one of the most effective lifestyle changes for controlling blood pressure. If you're overweight or obese, losing even a small amount of weight can help reduce blood pressure. Blood pressure might go down by about 1 millimeter of mercury (mm Hg) with each kilogram (about 2.2 pounds) of weight lost.  Exercise regularly As a general goal, aim for at least 30 minutes of moderate physical activity every day. Regular physical activity can lower high blood pressure by about 5 to 8 mm Hg.  Eat a healthy diet Eating a diet rich in whole grains, fruits, vegetables, and low-fat dairy products and low in saturated fat and cholesterol. A healthy diet can lower high blood pressure by up to 11 mm Hg.  Reduce salt (sodium) in your diet Even a small reduction of sodium in the diet can improve heart health and reduce high blood pressure by about 5 to 6 mm Hg.  Limit alcohol One drink equals 12 ounces of beer, 5 ounces of  wine, or 1.5 ounces of 80-proof liquor.  Limiting alcohol to less than one drink a day for women or two drinks a day for men can help lower blood pressure by about 4 mm Hg.   If you have any questions or concerns please use My Chart to send questions or call the office at [phone]  

## 2022-08-14 NOTE — Assessment & Plan Note (Addendum)
Assessment: BP is un/controlled in office BP 144/81 mmHg upon repeating it was 130/78 heart rate 85 Home BP readings: ~ 135/80 heart rate 75-80  Home cuff validated it was not accurate  Tolerates losartan 100 mg well without any side effects  Denies SOB, palpitation, chest pain, headaches,or swelling She gets dizzy and nauseated  she thinks it is not from medications she may have some other ear problem that need further assessment from ENT specialist  Reiterated the importance of regular exercise and low salt diet   Plan:  No medications changes  Continue taking losartan 100 mg daily  Patient to get new home BP cuff and bring in for validation at the next OV  Patient to keep record of BP readings with heart rate and report to [place] at the next visit Patient to see PharmD in 4 weeks for follow up

## 2022-08-15 DIAGNOSIS — M25512 Pain in left shoulder: Secondary | ICD-10-CM | POA: Diagnosis not present

## 2022-08-15 LAB — URIC ACID: Uric Acid: 5.1 mg/dL (ref 3.1–7.9)

## 2022-08-29 ENCOUNTER — Other Ambulatory Visit (HOSPITAL_COMMUNITY): Payer: Self-pay

## 2022-08-29 ENCOUNTER — Telehealth: Payer: Self-pay

## 2022-08-29 ENCOUNTER — Telehealth: Payer: Self-pay | Admitting: Pharmacist Clinician (PhC)/ Clinical Pharmacy Specialist

## 2022-08-29 DIAGNOSIS — M25512 Pain in left shoulder: Secondary | ICD-10-CM | POA: Diagnosis not present

## 2022-08-29 MED ORDER — NEXLETOL 180 MG PO TABS: 180.0000 mg | ORAL_TABLET | Freq: Every day | ORAL | 0 refills | Status: DC

## 2022-08-29 NOTE — Telephone Encounter (Signed)
Patient called to report she is doing well with Nexletol samples.  We will get PA started and give her samples for now until PA approved.

## 2022-08-29 NOTE — Telephone Encounter (Signed)
Please to PA for Nexletol 180 mg daily

## 2022-08-29 NOTE — Telephone Encounter (Addendum)
Pharmacy Patient Advocate Encounter  Received notification from [facility] that the request for prior authorization for Nexletol 180mg  has been denied due to  The plans preferred drug is repatha. Please advise..     KeyHY:[id number]

## 2022-08-31 NOTE — Telephone Encounter (Signed)
Spoke with patient.  She will continue with Nexletol samples then we will try Repatha sample and get PA.

## 2022-09-03 DIAGNOSIS — Z961 Presence of intraocular lens: Secondary | ICD-10-CM | POA: Diagnosis not present

## 2022-09-03 DIAGNOSIS — H401132 Primary open-angle glaucoma, bilateral, moderate stage: Secondary | ICD-10-CM | POA: Diagnosis not present

## 2022-09-04 ENCOUNTER — Ambulatory Visit (INDEPENDENT_AMBULATORY_CARE_PROVIDER_SITE_OTHER): Payer: Medicare HMO | Admitting: Neurology

## 2022-09-04 DIAGNOSIS — G4733 Obstructive sleep apnea (adult) (pediatric): Secondary | ICD-10-CM

## 2022-09-04 DIAGNOSIS — R0602 Shortness of breath: Secondary | ICD-10-CM

## 2022-09-04 DIAGNOSIS — G8929 Other chronic pain: Secondary | ICD-10-CM

## 2022-09-04 DIAGNOSIS — J849 Interstitial pulmonary disease, unspecified: Secondary | ICD-10-CM

## 2022-09-04 DIAGNOSIS — G478 Other sleep disorders: Secondary | ICD-10-CM

## 2022-09-04 DIAGNOSIS — R4584 Anhedonia: Secondary | ICD-10-CM

## 2022-09-05 DIAGNOSIS — M25512 Pain in left shoulder: Secondary | ICD-10-CM | POA: Diagnosis not present

## 2022-09-05 NOTE — Progress Notes (Signed)
[facility] SLEEP TEST REPORT ( by Watch PAT)   STUDY DATE: 09-05-2022  DOB:  Aug 02, 1950 MRN:  [id number]     ORDERING CLINICIAN:  REFERRING CLINICIAN: PCP Nelson Chimes, PA-C    CLINICAL INFORMATION/HISTORY: Anna Arnold was last seen on 07-16-2022, for my third sleep medicine consultation  for this patient with a history of past chronic insomnia. She carried a diagnosis of mild OSA in 2020. Has interstitial lung disease. HST in 2020 was the first time positive for mild apnea, but apnea was strongly supine positional dependent.  Compared to 2017 sleep study , 2007 sleep study.   ROS : SOB,  non restorative sleep, Insomnia related to chronic pain and known mild OSA - not on CPAP for positional factor .  Retest to see if new comorbididties affect degree and type of apnea. Her tracker device stated she did not get more than 60 minutes of REM sleep, but that may be normal at 15% of sleep time.  She is on  Wellbutrin, a REM suppressant medication .      Epworth sleepiness score: 2 /24. FSS at 35/ 63 points,  GDS 5/ 15 points    BMI:  28 kg/m   Neck Circumference: 13.5 "   Home Sleep Test Summary:   Total Recording Time (hours, min): 7 h 23 m        Total Sleep Time (hours, min):  5 h 49 m               Percent REM (%):    24.3%                                    Respiratory Indices by AASM scoring:   Calculated pAHI (per hour):   8.1/h                          REM pAHI:   17.2 /h                                              NREM pAHI:  5.3/h                            Positional AHI:  patient slept for 192 minutes on her left side, AHI of 9.1/h. Right lateral sleep was associated with an AHI of 4.9/h and supine sleep with an AHI of 14.6/h.  Snoring was present for half of the recorded time.                                                  Oxygen Saturation Statistics:          O2 Saturation Range (%):  between 87%  and 99% with a mean sat of 94%                                     O2 Saturation (  minutes) <89%: 0.1 m          Pulse Rate Statistics:   Pulse Mean (bpm):  8 63 bpm ( heart rate without heart rhythm data )               Pulse Range:   between 47 and 92 bpm.              IMPRESSION:  This HST confirms the presence of  supine and REM sleep dependent sleep apnea , which is very mild.  Snoring was loudest in supine REM sleep. No hypoxia of clinical significance was found.     RECOMMENDATION: I can offer CPAP as an optional therapy but the patient does still achieve a high proportion of REM sleep by just positional treatment, and without hypoxia being present.   I will offer a CPAP trial but do not feel it is imperative as a treatment.  If Anna Arnold wants to start treatment on CPAP, an autotitration ResMed device of a pressure range setting from 5-12 cm water, with 1 cm EPR and heated humidification will be provided, her interface should allow non- supine sleep and will need to be fitted in reclined position.    INTERPRETING PHYSICIAN:   Melvyn Novas, MD   Medical Director of [facility] at [facility].

## 2022-09-07 NOTE — Procedures (Signed)
[facility] SLEEP TEST REPORT ( by Watch PAT)   STUDY DATE: 09-05-2022  DOB:  1951/05/19 MRN:  [id number]     ORDERING CLINICIAN:  REFERRING CLINICIAN: PCP Nelson Chimes, PA-C    CLINICAL INFORMATION/HISTORY: Jerrel Ivory Sinha was last seen on 07-16-2022, for my third sleep medicine consultation  for this patient with a history of past chronic insomnia. She carried a diagnosis of mild OSA in 2020. Has interstitial lung disease. HST in 2020 was the first time positive for mild apnea, but apnea was strongly supine positional dependent.  Compared to 2017 sleep study , 2007 sleep study.   ROS : SOB,  non restorative sleep, Insomnia related to chronic pain and known mild OSA - not on CPAP for positional factor .  Retest to see if new comorbididties affect degree and type of apnea. Her tracker device stated she did not get more than 60 minutes of REM sleep, but that may be normal at 15% of sleep time.  She is on  Wellbutrin, a REM suppressant medication .      Epworth sleepiness score: 2 /24. FSS at 35/ 63 points,  GDS 5/ 15 points    BMI:  28 kg/m   Neck Circumference: 13.5 "   Home Sleep Test Summary:   Total Recording Time (hours, min): 7 h 23 m        Total Sleep Time (hours, min):  5 h 49 m               Percent REM (%):    24.3%                                    Respiratory Indices by AASM scoring:   Calculated pAHI (per hour):   8.1/h                          REM pAHI:   17.2 /h                                              NREM pAHI:  5.3/h                            Positional AHI:  patient slept for 192 minutes on her left side, AHI of 9.1/h. Right lateral sleep was associated with an AHI of 4.9/h and supine sleep with an AHI of 14.6/h.  Snoring was present for half of the recorded time.                                                  Oxygen Saturation Statistics:          O2 Saturation Range (%):  between 87% and 99%  with a mean sat of 94%                                     O2 Saturation (minutes) <89%: 0.1 m  minutes) <89%: 0.1 m          Pulse Rate Statistics:   Pulse Mean (bpm):  8 63 bpm ( heart rate without heart rhythm data )               Pulse Range:   between 47 and 92 bpm.              IMPRESSION:  This HST confirms the presence of  supine and REM sleep dependent sleep apnea , which is very mild.  Snoring was loudest in supine REM sleep. No hypoxia of clinical significance was found.     RECOMMENDATION: I can offer CPAP as an optional therapy but the patient does still achieve a high proportion of REM sleep by just positional treatment, and without hypoxia being present.   I will offer a CPAP trial but do not feel it is imperative as a treatment.  If Mrs Blanck wants to start treatment on CPAP, an autotitration ResMed device of a pressure range setting from 5-12 cm water, with 1 cm EPR and heated humidification will be provided, her interface should allow non- supine sleep and will need to be fitted in reclined position.    INTERPRETING PHYSICIAN:   Laurier Jasperson, MD   Medical Director of [facility].                          

## 2022-09-10 ENCOUNTER — Encounter: Payer: Self-pay | Admitting: Neurology

## 2022-09-14 DIAGNOSIS — M542 Cervicalgia: Secondary | ICD-10-CM | POA: Diagnosis not present

## 2022-09-19 ENCOUNTER — Ambulatory Visit: Payer: Medicare HMO

## 2022-09-19 ENCOUNTER — Ambulatory Visit: Payer: Medicare HMO | Attending: Internal Medicine | Admitting: Pharmacist Clinician (PhC)/ Clinical Pharmacy Specialist

## 2022-09-19 ENCOUNTER — Encounter: Payer: Self-pay | Admitting: Pharmacist Clinician (PhC)/ Clinical Pharmacy Specialist

## 2022-09-19 VITALS — BP 147/84 | HR 86 | Ht <= 58 in | Wt 128.8 lb

## 2022-09-19 DIAGNOSIS — I10 Essential (primary) hypertension: Secondary | ICD-10-CM

## 2022-09-19 DIAGNOSIS — M25512 Pain in left shoulder: Secondary | ICD-10-CM | POA: Diagnosis not present

## 2022-09-19 NOTE — Assessment & Plan Note (Signed)
Assessment: BP is uncontrolled in office BP 151/82 mmHg;  above the goal (<130/80). Tolerates losartan well without any side effects Denies SOB, palpitation, chest pain, headaches,or swelling Patient continues to stay active, > 10,000 steps many days, recently started back at [facility]   Plan:  Continue taking losartan 100 mg daily Patient to keep record of BP readings with heart rate and report to [place] should home readings trend upward Patient to follow up with Dr.Schumann in November  Labs ordered today:  none Although BP elevated in office, she does have measure of white coat hypertension as well as multiple medication sensitivities.  Will consider adding second medication or switching losartan to more potent ARB should home pressures rise significantly.

## 2022-09-19 NOTE — Patient Instructions (Signed)
Follow up appointment: schedule with Dr. Bjorn Pippin for November  Take your BP meds as follows: no changes to medication today  Check your blood pressure at home daily (if able) and keep record of the readings.  Hypertension "High blood pressure"  Hypertension is often called "The Silent Killer." It rarely causes symptoms until it is extremely  high or has done damage to other organs in the body. For this reason, you should have your  blood pressure checked regularly by your physician. We will check your blood pressure  every time you see a provider at one of our offices.   Your blood pressure reading consists of two numbers. Ideally, blood pressure should be  below 120/80. The first ("top") number is called the systolic pressure. It measures the  pressure in your arteries as your heart beats. The second ("bottom") number is called the diastolic pressure. It measures the pressure in your arteries as the heart relaxes between beats.  The benefits of getting your blood pressure under control are enormous. A 10-point  reduction in systolic blood pressure can reduce your risk of stroke by 27% and heart failure by 28%  Your blood pressure goal is < 130/80  To check your pressure at home you will need to:  1. Sit up in a chair, with feet flat on the floor and back supported. Do not cross your ankles or legs. 2. Rest your left arm so that the cuff is about heart level. If the cuff goes on your upper arm,  then just relax the arm on the table, arm of the chair or your lap. If you have a wrist cuff, we  suggest relaxing your wrist against your chest (think of it as Pledging the Flag with the  wrong arm).  3. Place the cuff snugly around your arm, about 1 inch above the crook of your elbow. The  cords should be inside the groove of your elbow.  4. Sit quietly, with the cuff in place, for about 5 minutes. After that 5 minutes press the power  button to start a reading. 5. Do not talk or move  while the reading is taking place.  6. Record your readings on a sheet of paper. Although most cuffs have a memory, it is often  easier to see a pattern developing when the numbers are all in front of you.  7. You can repeat the reading after 1-3 minutes if it is recommended  Make sure your bladder is empty and you have not had caffeine or tobacco within the last 30 min  Always bring your blood pressure log with you to your appointments. If you have not brought your monitor in to be double checked for accuracy, please bring it to your next appointment.  You can find a list of quality blood pressure cuffs at [email or web address]

## 2022-09-19 NOTE — Progress Notes (Signed)
Office Visit    Patient Name: Anna Arnold Date of Encounter: 09/19/2022  Primary Care Provider:  Jarrett Soho, PA-C Primary Cardiologist:  Little Ishikawa, MD  Chief Complaint    Hypertension  Significant Past Medical History   CAD Calcium seen on CT,   palpitations Wore 10 day Zio, no significant abnormalities  HTN Home log shows reasonable control, on losartan 100 mg  DVT      Allergies  Allergen Reactions   Brimonidine Other (See Comments)    swelling    Ceftin [Cefuroxime Axetil]     Diarrhea and vomiting   Netarsudil Other (See Comments)   Netarsudil Dimesylate Dermatitis, Itching, Swelling and Other (See Comments)    In eyes, itching     Viibryd [Vilazodone Hcl]     agitation   Amitriptyline     sedation   Atenolol     fatigue   Augmentin [Amoxicillin-Pot Clavulanate] Nausea Only    GI pain   Biaxin [Clarithromycin] Nausea Only    GI pain   Brinzolamide Anxiety   Chlorthalidone     cramping   Citalopram Hydrobromide Other (See Comments)    fatigue   Erythromycin Rash   Fluoxetine Other (See Comments)    agitation   Lexapro [Escitalopram Oxalate]     Agitation and nausea   Meloxicam Nausea Only    Nausea    Potassium Chloride     Abdominal pain   Reglan [Metoclopramide]     Abdominal pain   Sertraline Hcl Other (See Comments)    fatigue   Simvastatin     Joint and muscle pain   Singulair [Montelukast Sodium]     Severe agitation, pruritis    Travoprost    Zegerid [Omeprazole] Nausea Only    nausea    History of Present Illness    Anna Arnold is a 72 y.o. female patient of Dr Bjorn Pippin, in the office today for hypertension management.  I saw her earlier this year for cholesterol issues and she was most recently seen by Carmela Hurt PharmD for hypertension.  It was determined that her home meter was not accurate, and no changes were made to her medications (losartan 100 mg qd).  She was advised to get a newer meter.     Today she returns for follow up.  She notes that she did have 2 cups of caffeinated coffee this morning, but usually drinks decaf (out of decaf pods at home).  She has no concerns with her losartan and no problems with dizziness, lightheadedness or positional hypotension.  States that a recent sleep study noted mild sleep apnea and she was advised to sleep on her side.  Has been adjusting to this and feels that she is now more refreshed when she wakes in the morning.    Blood Pressure Goal:  130/80  Current Medications:  losartan 100 mg qd  Previously tried: atenolol, chlorthalidone  Family Hx:  father died of MI at age 25, though she thinks he had a stent at some point prior to this. Her oldest brother died of MI at age 19.  Mother died lung cancer, had hypertension; another brother with hypertension; one daughter, borderline glaucoma   Social Hx: Tobacco: no Alcohol: no    Diet:  even mix of home and eating out; home mostly mostly produce , not much beef or pork; works with [employer]; lots of fish; has cravings for sweets;  working with wW - maintenance   Exercise: has  stationary bike, but not using regularly; looking into Silver Sneakers  Home BP readings: using husband's Omron device with normal cuff  8 morning readings average 128/82 HR 73  5 evening readings average  125/80 HR 73   Adherence Assessment   Do you ever forget to take your medication? Yes No  Do you ever skip doses due to side effects? Yes No  Do you have trouble affording your medicines? Yes No  Are you ever unable to pick up your medication due to transportation difficulties? Yes No  Do you ever stop taking your medications because you don't believe they are helping? Yes No    Adherence strategy: 7 day pill minder  Accessory Clinical Findings    Lab Results  Component Value Date   CREATININE 1.06 (H) 07/06/2019   BUN 13 07/06/2019   NA 138 07/06/2019   K 4.0  07/06/2019   CL 104 07/06/2019   CO2 24 07/06/2019   Lab Results  Component Value Date   ALT 25 07/06/2019   AST 31 07/06/2019   ALKPHOS 68 07/06/2019   BILITOT 0.4 07/06/2019   No results found for: "HGBA1C"  Home Medications    Current Outpatient Medications  Medication Sig Dispense Refill   ACETAMINOPHEN PO Take 650 mg by mouth as needed.     albuterol (VENTOLIN HFA) 108 (90 Base) MCG/ACT inhaler Inhale 2 puffs into the lungs every 6 (six) hours as needed for wheezing or shortness of breath. 18 g 5   alendronate (FOSAMAX) 70 MG tablet Take 70 mg by mouth once a week. Take with a full glass of water on an empty stomach.     azelastine (ASTELIN) 0.1 % nasal spray Place 2 sprays into both nostrils 2 (two) times daily. Use in each nostril as directed 30 mL 5   buPROPion (WELLBUTRIN SR) 100 MG 12 hr tablet Take 100 mg by mouth daily.     Cholecalciferol (VITAMIN D3) 1000 units CAPS Take by mouth.     Coenzyme Q10 (COQ10) 200 MG CAPS      Gluc-Chonn-MSM-Boswellia-Vit D (GLUCOSAMINE CHOND TRIPLE/VIT D PO) Take 1 tablet by mouth daily.     losartan (COZAAR) 100 MG tablet Take 100 mg by mouth daily.     meclizine (ANTIVERT) 25 MG tablet Take 25 mg by mouth 3 (three) times daily as needed for dizziness.     Multiple Minerals-Vitamins (CALCIUM-MAGNESIUM-ZINC-D3 PO) Take by mouth.     pantoprazole (PROTONIX) 20 MG tablet TAKE 1 TABLET BY MOUTH DAILY 30 tablet 5   SUMAtriptan (IMITREX) 100 MG tablet Take 100 mg by mouth every 2 (two) hours as needed for migraine. Take 1/2 tablet for migraine. May repeat in 2 hours if headache persists or recurs.     Turmeric (QC TUMERIC COMPLEX PO) Take by mouth.     Current Facility-Administered Medications  Medication Dose Route Frequency Provider Last Rate Last Admin   0.9 %  sodium chloride infusion  500 mL Intravenous Continuous Napoleon Form, MD         HYPERTENSION CONTROL Vitals:   09/19/22 1123 09/19/22 1129  BP: (!) 151/82 (!) 147/84     The patient's blood pressure is elevated above target today.  In order to address the patient's elevated BP: Blood pressure will be monitored at home to determine if medication changes need to be made.      Assessment & Plan    Essential hypertension Assessment: BP is uncontrolled in office BP 151/82 mmHg;  above  the goal (<130/80). Tolerates losartan well without any side effects Denies SOB, palpitation, chest pain, headaches,or swelling Patient continues to stay active, > 10,000 steps many days, recently started back at [facility]   Plan:  Continue taking losartan 100 mg daily Patient to keep record of BP readings with heart rate and report to [place] should home readings trend upward Patient to follow up with Dr.Schumann in November  Labs ordered today:  none Although BP elevated in office, she does have measure of white coat hypertension as well as multiple medication sensitivities.  Will consider adding second medication or switching losartan to more potent ARB should home pressures rise significantly.     Phillips Hay PharmD CPP [facility]  [address] [place], [place] [address] [phone]   09/19/2022, 11:49 AM

## 2022-09-24 ENCOUNTER — Telehealth: Payer: Self-pay | Admitting: Pharmacist Clinician (PhC)/ Clinical Pharmacy Specialist

## 2022-09-24 NOTE — Telephone Encounter (Signed)
Please start PA for Repatha for this patient.

## 2022-09-25 ENCOUNTER — Other Ambulatory Visit (HOSPITAL_COMMUNITY): Payer: Self-pay

## 2022-09-25 ENCOUNTER — Telehealth: Payer: Self-pay

## 2022-09-25 DIAGNOSIS — E7849 Other hyperlipidemia: Secondary | ICD-10-CM

## 2022-09-25 NOTE — Telephone Encounter (Signed)
Pharmacy Patient Advocate Encounter  Prior Authorization for REPATHA has been approved.    Effective dates: 05/28/22 through 05/28/23  Tenzin Edelman, CPhT Pharmacy Patient Advocate Specialist Direct Number: [phone] Fax: [phone] 

## 2022-09-25 NOTE — Telephone Encounter (Signed)
Pharmacy Patient Advocate Encounter   Received notification from [facility] MEDICARE that prior authorization for REPATHA is needed.    PA submitted on 09/25/22 Key [id number] Status is pending  Haze Rushing, CPhT Pharmacy Patient Advocate Specialist Direct Number: [phone] Fax: [phone]

## 2022-09-26 DIAGNOSIS — M25512 Pain in left shoulder: Secondary | ICD-10-CM | POA: Diagnosis not present

## 2022-10-01 MED ORDER — REPATHA 140 MG/ML ~~LOC~~ SOSY: 140.0000 mg | PREFILLED_SYRINGE | SUBCUTANEOUS | 3 refills | Status: DC

## 2022-10-01 NOTE — Addendum Note (Signed)
Addended by: Tylene Fantasia on: 10/01/2022 01:22 PM   Modules accepted: Orders

## 2022-10-03 DIAGNOSIS — M25512 Pain in left shoulder: Secondary | ICD-10-CM | POA: Diagnosis not present

## 2022-10-07 ENCOUNTER — Telehealth: Payer: Self-pay | Admitting: Cardiology

## 2022-10-07 NOTE — Telephone Encounter (Signed)
Patient called on call outpatient service line.    Patient called stating that she received the syringed form of her repatha and would like the auto inject. She had some concerns that missing a dose would be problematic, but I assured her it was not and that somebody would reach back out to her to get the auto inject ordered instead.

## 2022-10-08 MED ORDER — REPATHA SURECLICK 140 MG/ML ~~LOC~~ SOAJ: 140.0000 mg | SUBCUTANEOUS | 3 refills | Status: DC

## 2022-10-08 NOTE — Addendum Note (Signed)
Addended by: Tylene Fantasia on: 10/08/2022 03:30 PM   Modules accepted: Orders

## 2022-10-09 DIAGNOSIS — M25512 Pain in left shoulder: Secondary | ICD-10-CM | POA: Diagnosis not present

## 2022-10-23 DIAGNOSIS — M25512 Pain in left shoulder: Secondary | ICD-10-CM | POA: Diagnosis not present

## 2022-10-23 DIAGNOSIS — M542 Cervicalgia: Secondary | ICD-10-CM | POA: Diagnosis not present

## 2022-10-26 DIAGNOSIS — M542 Cervicalgia: Secondary | ICD-10-CM | POA: Diagnosis not present

## 2022-10-26 DIAGNOSIS — M25512 Pain in left shoulder: Secondary | ICD-10-CM | POA: Diagnosis not present

## 2022-11-02 DIAGNOSIS — M25512 Pain in left shoulder: Secondary | ICD-10-CM | POA: Diagnosis not present

## 2022-11-02 DIAGNOSIS — M542 Cervicalgia: Secondary | ICD-10-CM | POA: Diagnosis not present

## 2022-11-06 ENCOUNTER — Other Ambulatory Visit: Payer: Self-pay | Admitting: Cardiology

## 2022-11-06 DIAGNOSIS — K219 Gastro-esophageal reflux disease without esophagitis: Secondary | ICD-10-CM | POA: Diagnosis not present

## 2022-11-06 DIAGNOSIS — I700 Atherosclerosis of aorta: Secondary | ICD-10-CM | POA: Diagnosis not present

## 2022-11-06 DIAGNOSIS — J849 Interstitial pulmonary disease, unspecified: Secondary | ICD-10-CM | POA: Diagnosis not present

## 2022-11-06 DIAGNOSIS — R030 Elevated blood-pressure reading, without diagnosis of hypertension: Secondary | ICD-10-CM | POA: Diagnosis not present

## 2022-11-06 NOTE — Progress Notes (Signed)
Orders placed for inclisiran.   Electronic signature requested through Service Center Portal

## 2022-11-07 DIAGNOSIS — M25512 Pain in left shoulder: Secondary | ICD-10-CM | POA: Diagnosis not present

## 2022-11-13 ENCOUNTER — Other Ambulatory Visit: Payer: Self-pay | Admitting: Emergency Medicine

## 2022-11-16 ENCOUNTER — Telehealth: Payer: Self-pay | Admitting: Cardiology

## 2022-11-16 NOTE — Telephone Encounter (Signed)
Spoke with [employer].  States that insurance card was received, but the side of the card was cut off in the transmission.  Re-sent fax as requested

## 2022-11-16 NOTE — Telephone Encounter (Signed)
Pt c/o medication issue:  1. Name of Medication:  Leqvio   2. How are you currently taking this medication (dosage and times per day)?   3. Are you having a reaction (difficulty breathing--STAT)?   4. What is your medication issue?  Wess with [employer] they received the application but they are missing the right side of patient's insurance card.  Phone#: [phone] Fax#: [phone]

## 2022-11-23 ENCOUNTER — Other Ambulatory Visit (HOSPITAL_BASED_OUTPATIENT_CLINIC_OR_DEPARTMENT_OTHER): Payer: Self-pay | Admitting: Pharmacist Clinician (PhC)/ Clinical Pharmacy Specialist

## 2022-11-23 MED ORDER — REPATHA SURECLICK 140 MG/ML ~~LOC~~ SOAJ: 140.0000 mg | SUBCUTANEOUS | 3 refills | Status: DC

## 2022-11-23 NOTE — Telephone Encounter (Signed)
Pharmacy Patient Advocate Encounter  Received notification from [facility] that the request for prior authorization for NEXLETOL has been denied due to   .     PT MUST TRY REPATHA FIRST

## 2022-11-26 DIAGNOSIS — Z961 Presence of intraocular lens: Secondary | ICD-10-CM | POA: Diagnosis not present

## 2022-11-26 DIAGNOSIS — H401132 Primary open-angle glaucoma, bilateral, moderate stage: Secondary | ICD-10-CM | POA: Diagnosis not present

## 2022-11-27 DIAGNOSIS — M25512 Pain in left shoulder: Secondary | ICD-10-CM | POA: Diagnosis not present

## 2022-12-03 ENCOUNTER — Other Ambulatory Visit: Payer: Self-pay | Admitting: Nurse Practitioner

## 2022-12-04 DIAGNOSIS — Z1283 Encounter for screening for malignant neoplasm of skin: Secondary | ICD-10-CM | POA: Diagnosis not present

## 2022-12-04 DIAGNOSIS — D225 Melanocytic nevi of trunk: Secondary | ICD-10-CM | POA: Diagnosis not present

## 2022-12-13 DIAGNOSIS — H401132 Primary open-angle glaucoma, bilateral, moderate stage: Secondary | ICD-10-CM | POA: Diagnosis not present

## 2022-12-31 DIAGNOSIS — M25512 Pain in left shoulder: Secondary | ICD-10-CM | POA: Diagnosis not present

## 2023-01-01 DIAGNOSIS — M7502 Adhesive capsulitis of left shoulder: Secondary | ICD-10-CM | POA: Diagnosis not present

## 2023-01-09 ENCOUNTER — Other Ambulatory Visit: Payer: Self-pay | Admitting: Family Medicine

## 2023-01-09 DIAGNOSIS — Z1231 Encounter for screening mammogram for malignant neoplasm of breast: Secondary | ICD-10-CM

## 2023-01-16 ENCOUNTER — Other Ambulatory Visit: Payer: Self-pay | Admitting: Pharmacist Clinician (PhC)/ Clinical Pharmacy Specialist

## 2023-01-16 DIAGNOSIS — E7849 Other hyperlipidemia: Secondary | ICD-10-CM

## 2023-01-18 DIAGNOSIS — E7849 Other hyperlipidemia: Secondary | ICD-10-CM | POA: Diagnosis not present

## 2023-01-19 LAB — LIPID PANEL
Chol/HDL Ratio: 2.5 ratio (ref 0.0–4.4)
Cholesterol, Total: 174 mg/dL (ref 100–199)
HDL: 71 mg/dL
LDL Chol Calc (NIH): 86 mg/dL (ref 0–99)
Triglycerides: 91 mg/dL (ref 0–149)
VLDL Cholesterol Cal: 17 mg/dL (ref 5–40)

## 2023-01-23 DIAGNOSIS — M25512 Pain in left shoulder: Secondary | ICD-10-CM | POA: Diagnosis not present

## 2023-01-23 DIAGNOSIS — M6281 Muscle weakness (generalized): Secondary | ICD-10-CM | POA: Diagnosis not present

## 2023-01-29 DIAGNOSIS — R739 Hyperglycemia, unspecified: Secondary | ICD-10-CM | POA: Diagnosis not present

## 2023-01-29 DIAGNOSIS — Z9181 History of falling: Secondary | ICD-10-CM | POA: Diagnosis not present

## 2023-01-29 DIAGNOSIS — K219 Gastro-esophageal reflux disease without esophagitis: Secondary | ICD-10-CM | POA: Diagnosis not present

## 2023-01-29 DIAGNOSIS — I129 Hypertensive chronic kidney disease with stage 1 through stage 4 chronic kidney disease, or unspecified chronic kidney disease: Secondary | ICD-10-CM | POA: Diagnosis not present

## 2023-01-29 DIAGNOSIS — R413 Other amnesia: Secondary | ICD-10-CM | POA: Diagnosis not present

## 2023-01-29 DIAGNOSIS — Z0000 Encounter for general adult medical examination without abnormal findings: Secondary | ICD-10-CM | POA: Diagnosis not present

## 2023-01-29 DIAGNOSIS — I700 Atherosclerosis of aorta: Secondary | ICD-10-CM | POA: Diagnosis not present

## 2023-01-30 DIAGNOSIS — M6281 Muscle weakness (generalized): Secondary | ICD-10-CM | POA: Diagnosis not present

## 2023-01-30 DIAGNOSIS — M25512 Pain in left shoulder: Secondary | ICD-10-CM | POA: Diagnosis not present

## 2023-02-01 DIAGNOSIS — M25512 Pain in left shoulder: Secondary | ICD-10-CM | POA: Diagnosis not present

## 2023-02-01 DIAGNOSIS — M6281 Muscle weakness (generalized): Secondary | ICD-10-CM | POA: Diagnosis not present

## 2023-02-12 DIAGNOSIS — M6281 Muscle weakness (generalized): Secondary | ICD-10-CM | POA: Diagnosis not present

## 2023-02-12 DIAGNOSIS — M25512 Pain in left shoulder: Secondary | ICD-10-CM | POA: Diagnosis not present

## 2023-02-13 DIAGNOSIS — M25512 Pain in left shoulder: Secondary | ICD-10-CM | POA: Diagnosis not present

## 2023-02-14 DIAGNOSIS — M25512 Pain in left shoulder: Secondary | ICD-10-CM | POA: Diagnosis not present

## 2023-02-14 DIAGNOSIS — M6281 Muscle weakness (generalized): Secondary | ICD-10-CM | POA: Diagnosis not present

## 2023-02-18 ENCOUNTER — Ambulatory Visit
Admission: RE | Admit: 2023-02-18 | Discharge: 2023-02-18 | Disposition: A | Discharge status: Home / Self Care | Payer: Medicare HMO | Source: Ambulatory Visit | Attending: Family Medicine

## 2023-02-18 DIAGNOSIS — Z1231 Encounter for screening mammogram for malignant neoplasm of breast: Secondary | ICD-10-CM | POA: Diagnosis not present

## 2023-02-19 DIAGNOSIS — M6281 Muscle weakness (generalized): Secondary | ICD-10-CM | POA: Diagnosis not present

## 2023-02-19 DIAGNOSIS — M25512 Pain in left shoulder: Secondary | ICD-10-CM | POA: Diagnosis not present

## 2023-02-21 DIAGNOSIS — M6281 Muscle weakness (generalized): Secondary | ICD-10-CM | POA: Diagnosis not present

## 2023-02-21 DIAGNOSIS — M25512 Pain in left shoulder: Secondary | ICD-10-CM | POA: Diagnosis not present

## 2023-02-27 DIAGNOSIS — Z6827 Body mass index (BMI) 27.0-27.9, adult: Secondary | ICD-10-CM | POA: Diagnosis not present

## 2023-02-27 DIAGNOSIS — M25551 Pain in right hip: Secondary | ICD-10-CM | POA: Diagnosis not present

## 2023-02-27 DIAGNOSIS — G43009 Migraine without aura, not intractable, without status migrainosus: Secondary | ICD-10-CM | POA: Diagnosis not present

## 2023-03-07 DIAGNOSIS — Z23 Encounter for immunization: Secondary | ICD-10-CM | POA: Diagnosis not present

## 2023-03-14 DIAGNOSIS — H401131 Primary open-angle glaucoma, bilateral, mild stage: Secondary | ICD-10-CM | POA: Diagnosis not present

## 2023-03-18 DIAGNOSIS — M6281 Muscle weakness (generalized): Secondary | ICD-10-CM | POA: Diagnosis not present

## 2023-03-18 DIAGNOSIS — M545 Low back pain, unspecified: Secondary | ICD-10-CM | POA: Diagnosis not present

## 2023-03-25 DIAGNOSIS — M545 Low back pain, unspecified: Secondary | ICD-10-CM | POA: Diagnosis not present

## 2023-03-25 DIAGNOSIS — M6281 Muscle weakness (generalized): Secondary | ICD-10-CM | POA: Diagnosis not present

## 2023-03-29 ENCOUNTER — Ambulatory Visit: Payer: Medicare HMO | Attending: Cardiology | Admitting: Cardiology

## 2023-03-29 ENCOUNTER — Encounter: Payer: Self-pay | Admitting: Cardiology

## 2023-03-29 ENCOUNTER — Ambulatory Visit (INDEPENDENT_AMBULATORY_CARE_PROVIDER_SITE_OTHER): Payer: Medicare HMO

## 2023-03-29 VITALS — BP 136/88 | HR 85 | Ht 59.0 in | Wt 132.4 lb

## 2023-03-29 DIAGNOSIS — R002 Palpitations: Secondary | ICD-10-CM

## 2023-03-29 DIAGNOSIS — I10 Essential (primary) hypertension: Secondary | ICD-10-CM

## 2023-03-29 DIAGNOSIS — R0609 Other forms of dyspnea: Secondary | ICD-10-CM

## 2023-03-29 DIAGNOSIS — E785 Hyperlipidemia, unspecified: Secondary | ICD-10-CM

## 2023-03-29 NOTE — Progress Notes (Unsigned)
Enrolled patient for a 14 day Zio XT  monitor to be mailed to patients home  °

## 2023-03-29 NOTE — Patient Instructions (Signed)
Medication Instructions:  No medication changes *If you need a refill on your cardiac medications before your next appointment, please call your pharmacy*   Lab Work: None If you have labs (blood work) drawn today and your tests are completely normal, you will receive your results only by: MyChart Message (if you have MyChart) OR A paper copy in the mail If you have any lab test that is abnormal or we need to change your treatment, we will call you to review the results.   Testing/Procedures:  Christena Deem- Long Term Monitor Instructions   Your physician has requested you wear your ZIO patch monitor___14____days.   This is a single patch monitor.  [employer] supplies one patch monitor per enrollment.  Additional stickers are not available.   Please do not apply patch if you will be having a Nuclear Stress Test, Echocardiogram, Cardiac CT, MRI, or Chest Xray during the time frame you would be wearing the monitor. The patch cannot be worn during these tests.  You cannot remove and re-apply the ZIO XT patch monitor.   Your ZIO patch monitor will be sent USPS Priority mail from [facility] directly to your home address. The monitor may also be mailed to a PO BOX if home delivery is not available.   It may take 3-5 days to receive your monitor after you have been enrolled.   Once you have received you monitor, please review enclosed instructions.  Your monitor has already been registered assigning a specific monitor serial # to you.   Applying the monitor   Shave hair from upper left chest.   Hold abrader disc by orange tab.  Rub abrader in 40 strokes over left upper chest as indicated in your monitor instructions.   Clean area with 4 enclosed alcohol pads .  Use all pads to assure are is cleaned thoroughly.  Let dry.   Apply patch as indicated in monitor instructions.  Patch will be place under collarbone on left side of chest with arrow pointing upward.   Rub patch adhesive wings  for 2 minutes.Remove white label marked "1".  Remove white label marked "2".  Rub patch adhesive wings for 2 additional minutes.   While looking in a mirror, press and release button in center of patch.  A small green light will flash 3-4 times .  This will be your only indicator the monitor has been turned on.     Do not shower for the first 24 hours.  You may shower after the first 24 hours.   Press button if you feel a symptom. You will hear a small click.  Record Date, Time and Symptom in the Patient Log Book.   When you are ready to remove patch, follow instructions on last 2 pages of Patient Log Book.  Stick patch monitor onto last page of Patient Log Book.   Place Patient Log Book in [place] box.  Use locking tab on box and tape box closed securely.  The [employer] and [employer] has [employer] on it.  Please place in mailbox as soon as possible.  Your physician should have your test results approximately 7 days after the monitor has been mailed back to [facility].   Call [facility] Customer Care at [phone] if you have questions regarding your ZIO XT patch monitor.  Call them immediately if you see an orange light blinking on your monitor.   If your monitor falls off in less than 4 days contact our Monitor department at  [phone].  If your monitor becomes loose or falls off after 4 days call [employer] at [phone] for suggestions on securing your monitor.     Follow-Up: At [facility], you and your health needs are our priority.  As part of our continuing mission to provide you with exceptional heart care, we have created designated Provider Care Teams.  These Care Teams include your primary Cardiologist (physician) and Advanced Practice Providers (APPs -  Physician Assistants and Nurse Practitioners) who all work together to provide you with the care you need, when you need it.  We recommend signing up for the patient portal called "MyChart".  Sign up  information is provided on this After Visit Summary.  MyChart is used to connect with patients for Virtual Visits (Telemedicine).  Patients are able to view lab/test results, encounter notes, upcoming appointments, etc.  Non-urgent messages can be sent to your provider as well.   To learn more about what you can do with MyChart, go to [email or web address].    Your next appointment:   1 year(s)  Provider:   Little Ishikawa, MD

## 2023-04-01 DIAGNOSIS — M6281 Muscle weakness (generalized): Secondary | ICD-10-CM | POA: Diagnosis not present

## 2023-04-01 DIAGNOSIS — R002 Palpitations: Secondary | ICD-10-CM

## 2023-04-01 DIAGNOSIS — M545 Low back pain, unspecified: Secondary | ICD-10-CM | POA: Diagnosis not present

## 2023-04-04 DIAGNOSIS — M7061 Trochanteric bursitis, right hip: Secondary | ICD-10-CM | POA: Diagnosis not present

## 2023-04-09 DIAGNOSIS — M545 Low back pain, unspecified: Secondary | ICD-10-CM | POA: Diagnosis not present

## 2023-04-09 DIAGNOSIS — M6281 Muscle weakness (generalized): Secondary | ICD-10-CM | POA: Diagnosis not present

## 2023-04-16 DIAGNOSIS — M6281 Muscle weakness (generalized): Secondary | ICD-10-CM | POA: Diagnosis not present

## 2023-04-16 DIAGNOSIS — M25551 Pain in right hip: Secondary | ICD-10-CM | POA: Diagnosis not present

## 2023-04-23 DIAGNOSIS — R002 Palpitations: Secondary | ICD-10-CM | POA: Diagnosis not present

## 2023-06-12 DIAGNOSIS — J069 Acute upper respiratory infection, unspecified: Secondary | ICD-10-CM | POA: Diagnosis not present

## 2023-06-12 DIAGNOSIS — J849 Interstitial pulmonary disease, unspecified: Secondary | ICD-10-CM | POA: Diagnosis not present

## 2023-06-23 ENCOUNTER — Emergency Department (HOSPITAL_BASED_OUTPATIENT_CLINIC_OR_DEPARTMENT_OTHER)
Admission: EM | Admit: 2023-06-23 | Discharge: 2023-06-23 | Disposition: A | Discharge status: Home / Self Care | Payer: Medicare HMO | Attending: Emergency Medicine | Admitting: Emergency Medicine

## 2023-06-23 ENCOUNTER — Emergency Department (HOSPITAL_BASED_OUTPATIENT_CLINIC_OR_DEPARTMENT_OTHER): Payer: Medicare HMO | Admitting: Radiology

## 2023-06-23 DIAGNOSIS — R0602 Shortness of breath: Secondary | ICD-10-CM | POA: Diagnosis not present

## 2023-06-23 DIAGNOSIS — I700 Atherosclerosis of aorta: Secondary | ICD-10-CM | POA: Diagnosis not present

## 2023-06-23 DIAGNOSIS — M7918 Myalgia, other site: Secondary | ICD-10-CM

## 2023-06-23 DIAGNOSIS — R109 Unspecified abdominal pain: Secondary | ICD-10-CM | POA: Diagnosis not present

## 2023-06-23 DIAGNOSIS — J101 Influenza due to other identified influenza virus with other respiratory manifestations: Secondary | ICD-10-CM | POA: Insufficient documentation

## 2023-06-23 DIAGNOSIS — Z20822 Contact with and (suspected) exposure to covid-19: Secondary | ICD-10-CM | POA: Insufficient documentation

## 2023-06-23 DIAGNOSIS — K449 Diaphragmatic hernia without obstruction or gangrene: Secondary | ICD-10-CM | POA: Diagnosis not present

## 2023-06-23 DIAGNOSIS — M791 Myalgia, unspecified site: Secondary | ICD-10-CM | POA: Diagnosis not present

## 2023-06-23 DIAGNOSIS — R059 Cough, unspecified: Secondary | ICD-10-CM | POA: Diagnosis not present

## 2023-06-23 LAB — URINALYSIS, ROUTINE W REFLEX MICROSCOPIC
Bacteria, UA: NONE SEEN
Bilirubin Urine: NEGATIVE
Glucose, UA: NEGATIVE mg/dL
Hgb urine dipstick: NEGATIVE
Ketones, ur: NEGATIVE mg/dL
Leukocytes,Ua: NEGATIVE
Nitrite: NEGATIVE
Protein, ur: NEGATIVE mg/dL
Specific Gravity, Urine: 1.01 (ref 1.005–1.030)
pH: 7 (ref 5.0–8.0)

## 2023-06-23 LAB — RESP PANEL BY RT-PCR (RSV, FLU A&B, COVID)  RVPGX2
Influenza A by PCR: POSITIVE — AB
Influenza B by PCR: NEGATIVE
Resp Syncytial Virus by PCR: NEGATIVE
SARS Coronavirus 2 by RT PCR: NEGATIVE

## 2023-06-23 LAB — CBC
HCT: 40 % (ref 36.0–46.0)
Hemoglobin: 13 g/dL (ref 12.0–15.0)
MCH: 28.4 pg (ref 26.0–34.0)
MCHC: 32.5 g/dL (ref 30.0–36.0)
MCV: 87.5 fL (ref 80.0–100.0)
Platelets: 221 10*3/uL (ref 150–400)
RBC: 4.57 MIL/uL (ref 3.87–5.11)
RDW: 14.2 % (ref 11.5–15.5)
WBC: 5.7 10*3/uL (ref 4.0–10.5)
nRBC: 0 % (ref 0.0–0.2)

## 2023-06-23 LAB — BASIC METABOLIC PANEL
Anion gap: 9 (ref 5–15)
BUN: 20 mg/dL (ref 8–23)
CO2: 28 mmol/L (ref 22–32)
Calcium: 9.5 mg/dL (ref 8.9–10.3)
Chloride: 99 mmol/L (ref 98–111)
Creatinine, Ser: 1.04 mg/dL — ABNORMAL HIGH (ref 0.44–1.00)
GFR, Estimated: 57 mL/min — ABNORMAL LOW (ref >60)
Glucose, Bld: 97 mg/dL (ref 70–99)
Potassium: 3.5 mmol/L (ref 3.5–5.1)
Sodium: 136 mmol/L (ref 135–145)

## 2023-06-23 NOTE — ED Notes (Signed)
Discharge paperwork given and verbally understood.

## 2023-06-23 NOTE — ED Notes (Signed)
Pt seen by provider before assessment.Anna KitchenMarland Arnold

## 2023-06-23 NOTE — ED Triage Notes (Signed)
Pt reports sharp flank pain and fever that started this morning and mild SOB the past few days.  Pt seen by PMD for cough and body aches on Jan 15, which continue today.  Pt AAOX4, NAD in triage.

## 2023-06-23 NOTE — Discharge Instructions (Addendum)
Please read and follow all provided instructions.  Your diagnoses today include:  1. Influenza A   2. Musculoskeletal pain     Tests performed today include: Complete blood cell count: No issue Basic metabolic panel: No problem Urinalysis (urine test): No signs of infection Chest x-ray: No signs of pneumonia or other infection Respiratory panel: Positive for influenza A Vital signs. See below for your results today.   Medications prescribed:  None  Take any prescribed medications only as directed.  Home care instructions:  Follow any educational materials contained in this packet. Please continue drinking plenty of fluids. Use over-the-counter cold and flu medications as needed as directed on packaging for symptom relief. You may also use ibuprofen or tylenol as directed on packaging for pain or fever.   BE VERY CAREFUL not to take multiple medicines containing Tylenol (also called acetaminophen). Doing so can lead to an overdose which can damage your liver and cause liver failure and possibly death.   Follow-up instructions: Please follow-up with your primary care provider in the next 3-5 days for further evaluation of your symptoms if not improved.   Return instructions:  Please return to the Emergency Department if you experience worsening symptoms. Please return if you have a high fever greater than 101 degrees not controlled with over-the-counter medications, persistent vomiting and cannot keep down fluids, or worsening trouble breathing. Please return if you have any other emergent concerns.  Additional Information:  Your vital signs today were: BP (!) 160/93   Pulse 89   Temp (!) 100.7 F (38.2 C) (Oral)   Resp 18   SpO2 97%  If your blood pressure (BP) was elevated above 135/85 this visit, please have this repeated by your doctor within one month.

## 2023-06-23 NOTE — ED Provider Notes (Signed)
[place] EMERGENCY DEPARTMENT AT [facility] Provider Note   CSN: [id number] Arrival date & time: 06/23/23  1131     History  Chief Complaint  Patient presents with   Flank Pain    Anna Arnold is a 73 y.o. female.  Patient with history of interstitial lung disease followed by pulmonary --presents to the emergency department for fever, cough, body aches, right sided thoracic pain.  Patient reports having been sick with respiratory symptoms and bodyaches since the beginning of the year.  No fevers, reports temperature 97's at that time.  She tested negative for COVID on home test on January 13.  She followed up with her PCP on the 15th, reports no further testing or treatments.  Last week she did feel a bit better however early yesterday morning symptoms worsen.  She developed a more severe sore throat and persistent cough.  Low-grade fever to 99.7 F at home.  She has pain in the right side of the posterior ribs that is worse with movement and palpation.  Mildly worsened with deep breathing.  Cough is nonproductive.  No hemoptysis.  She does feel short of breath when she walks.  No unusual lower extremity swelling, she reports left sided lower extremity edema at baseline.  Patient has been taking extra strength Tylenol which helps take the edge off the pain at times. She did have a flu vaccine this season.       Home Medications Prior to Admission medications   Medication Sig Start Date End Date Taking? Authorizing Provider  ABRYSVO 120 MCG/0.5ML injection Inject 0.5 mLs into the muscle once. Patient not taking: Reported on 03/29/2023 12/10/22   [provider]  ACETAMINOPHEN PO Take 650 mg by mouth as needed.    [provider]  albuterol (VENTOLIN HFA) 108 (90 Base) MCG/ACT inhaler INHALE TWO PUFFS BY MOUTH EVERY 6 HOURS AS NEEDED FOR WHEEZING AND FOR SHORTNESS OF BREATH 11/13/22   Leslye Peer, MD  alendronate (FOSAMAX) 70 MG tablet Take 70 mg by mouth  once a week. Take with a full glass of water on an empty stomach.    [provider]  azelastine (ASTELIN) 0.1 % nasal spray Place 2 sprays into both nostrils 2 (two) times daily. Use in each nostril as directed 12/06/21   Cobb, Ruby Cola, NP  benzonatate (TESSALON) 200 MG capsule Take 200 mg by mouth as needed. 12/06/21   [provider]  buPROPion (WELLBUTRIN SR) 100 MG 12 hr tablet Take 100 mg by mouth daily.    [provider]  Cholecalciferol (VITAMIN D3) 1000 units CAPS Take by mouth.    [provider]  Coenzyme Q10 (COQ10) 200 MG CAPS Take 200 mg by mouth 2 (two) times daily.    [provider]  COMIRNATY syringe Inject 0.3 mLs into the muscle once. Patient not taking: Reported on 03/29/2023 01/24/23   [provider]  Evolocumab (REPATHA SURECLICK) 140 MG/ML SOAJ Inject 140 mg into the skin every 14 (fourteen) days. 11/23/22   Little Ishikawa, MD  Gluc-Chonn-MSM-Boswellia-Vit D (GLUCOSAMINE CHOND TRIPLE/VIT D PO) Take 1 tablet by mouth daily.    [provider]  losartan (COZAAR) 100 MG tablet Take 100 mg by mouth daily.    [provider]  meclizine (ANTIVERT) 25 MG tablet Take 25 mg by mouth as needed for dizziness.    [provider]  Multiple Minerals-Vitamins (CALCIUM-MAGNESIUM-ZINC-D3 PO) Take by mouth.    [provider]  pantoprazole (  PROTONIX) 20 MG tablet TAKE 1 TABLET BY MOUTH DAILY 12/05/22   Cobb, Ruby Cola, NP  SUMAtriptan (IMITREX) 100 MG tablet Take 100 mg by mouth every 2 (two) hours as needed for migraine. Take 1/2 tablet for migraine. May repeat in 2 hours if headache persists or recurs.    [provider]      Allergies    Brimonidine, Ceftin [cefuroxime axetil], Netarsudil, Netarsudil dimesylate, Viibryd [vilazodone hcl], Amitriptyline, Atenolol, Augmentin [amoxicillin-pot clavulanate], Biaxin [clarithromycin], Brinzolamide, Chlorthalidone, Citalopram hydrobromide,  Erythromycin, Fluoxetine, Lexapro [escitalopram oxalate], Meloxicam, Potassium chloride, Reglan [metoclopramide], Sertraline hcl, Simvastatin, Singulair [montelukast sodium], Travoprost, and Zegerid [omeprazole]    Review of Systems   Review of Systems  Physical Exam Updated Vital Signs BP (!) 160/93   Pulse 89   Temp (!) 100.7 F (38.2 C) (Oral)   Resp 18   SpO2 97%  Physical Exam Vitals and nursing note reviewed.  Constitutional:      General: She is not in acute distress.    Appearance: She is well-developed.  HENT:     Head: Normocephalic and atraumatic.     Right Ear: External ear normal.     Left Ear: External ear normal.     Nose: Nose normal.  Eyes:     Conjunctiva/sclera: Conjunctivae normal.  Cardiovascular:     Rate and Rhythm: Normal rate and regular rhythm.     Heart sounds: No murmur heard. Pulmonary:     Effort: No respiratory distress.     Breath sounds: Rales present. No wheezing or rhonchi.     Comments: Mild rales bases, right middle Abdominal:     Palpations: Abdomen is soft.     Tenderness: There is no abdominal tenderness. There is no guarding or rebound.  Musculoskeletal:     Cervical back: Normal range of motion and neck supple.     Right lower leg: No edema.     Left lower leg: No edema.  Skin:    General: Skin is warm and dry.     Findings: No rash.  Neurological:     General: No focal deficit present.     Mental Status: She is alert. Mental status is at baseline.     Motor: No weakness.  Psychiatric:        Mood and Affect: Mood normal.    ED Results / Procedures / Treatments   Labs (all labs ordered are listed, but only abnormal results are displayed) Labs Reviewed  RESP PANEL BY RT-PCR (RSV, FLU A&B, COVID)  RVPGX2 - Abnormal; Notable for the following components:      Result Value   Influenza A by PCR POSITIVE (*)    All other components within normal limits  BASIC METABOLIC PANEL - Abnormal; Notable for the following components:    Creatinine, Ser 1.04 (*)    GFR, Estimated 57 (*)    All other components within normal limits  URINALYSIS, ROUTINE W REFLEX MICROSCOPIC  CBC    ED ECG REPORT   Date: 06/23/2023  Rate: 95  Rhythm: normal sinus rhythm  QRS Axis: right  Intervals: normal  ST/T Wave abnormalities: normal  Conduction Disutrbances:right bundle branch block  Narrative Interpretation:   Old EKG Reviewed: unchanged from 03/2023 except more rightward axis today  I have personally reviewed the EKG tracing and agree with the computerized printout as noted.   Radiology DG Chest 2 View Result Date: 06/23/2023 CLINICAL DATA:  Right flank pain.  Cough. EXAM: CHEST - 2 VIEW COMPARISON:  Chest radiograph dated December 06, 2021. CT chest dated July 07, 2022. FINDINGS: The heart size and mediastinal contours are within normal limits. Moderate hiatal hernia, unchanged. Aortic atherosclerosis. Similar bilateral chronic interstitial and basilar reticular changes. No focal consolidation, pleural effusion, or pneumothorax. No acute osseous abnormality identified. IMPRESSION: 1. No acute cardiopulmonary findings. 2. Similar chronic interstitial and basilar reticular changes. Electronically Signed   By: Hart Robinsons M.D.   On: 06/23/2023 15:13    Procedures Procedures    Medications Ordered in ED Medications - No data to display  ED Course/ Medical Decision Making/ A&P    Patient seen and examined. History obtained directly from patient. Work-up including labs, imaging, EKG ordered in triage, if performed, were reviewed.    Labs/EKG: Independently reviewed and interpreted.  This included: CBC unremarkable; BMP with mildly elevated creatinine at 1.04 but at baseline; UA negative; respiratory panel positive for flu A  Imaging: Ordered chest x-ray to evaluate right-sided rib pain evaluate for possibility of pneumonia  Medications/Fluids: None ordered  Most recent vital signs reviewed and are as follows: BP  (!) 160/93   Pulse 89   Temp (!) 100.7 F (38.2 C) (Oral)   Resp 18   SpO2 97%   Initial impression: Influenza A  3:25 PM Reassessment performed. Patient appears stable.  Imaging personally visualized and interpreted including: Chest x-ray, agree negative  Reviewed pertinent lab work and imaging with patient at bedside. Questions answered.   Most current vital signs reviewed and are as follows: BP (!) 160/93   Pulse 89   Temp (!) 100.7 F (38.2 C) (Oral)   Resp 18   SpO2 97%   Plan: Discharge to home.   Prescriptions written for: None.  Patient states that cough medications tend to upset her stomach.  She would rather use tea, honey, cough drops.  Confirmed that she does not want Tamiflu.  Other home care instructions discussed: Rest, hydration, close monitoring of symptoms  ED return instructions discussed: Return with worsening shortness of breath, increased work of breathing, persistent vomiting, new or worsening symptoms  Follow-up instructions discussed: Patient encouraged to follow-up with their PCP in 3-5 days if not improving.                                  Medical Decision Making Amount and/or Complexity of Data Reviewed Labs: ordered. Radiology: ordered.   Patient with symptoms consistent with influenza. Vitals are stable, low-grade fever. No signs of dehydration, tolerating PO's. Lungs are clear. Supportive therapy indicated with return if symptoms worsen. Patient counseled.  She also had right-sided pain and is reproducible with movement and palpation.  Her chest x-ray is clear.  She is not tachycardic or hypoxic.  Low concern for PE or ACS at this time.  Suspect MSK pain or pleuritic pain from the flu. EKG not ischemic, no significant changes from previous.   The patient's vital signs, pertinent lab work and imaging were reviewed and interpreted as discussed in the ED course. Hospitalization was considered for further testing, treatments, or serial  exams/observation. However as patient is well-appearing, has a stable exam, and reassuring studies today, I do not feel that they warrant admission at this time. This plan was discussed with the patient who verbalizes agreement and comfort with this plan and seems reliable and able to return to the Emergency Department with worsening or changing symptoms.  Final Clinical Impression(s) / ED Diagnoses Final diagnoses:  Influenza A  Musculoskeletal pain    Rx / DC Orders ED Discharge Orders     None         Renne Crigler, PA-C 06/23/23 1529    Tegeler, Canary Brim, MD 06/24/23 [id number]

## 2023-06-24 DIAGNOSIS — J101 Influenza due to other identified influenza virus with other respiratory manifestations: Secondary | ICD-10-CM | POA: Diagnosis not present

## 2023-06-27 ENCOUNTER — Encounter: Payer: Self-pay | Admitting: Emergency Medicine

## 2023-06-28 DIAGNOSIS — J111 Influenza due to unidentified influenza virus with other respiratory manifestations: Secondary | ICD-10-CM | POA: Diagnosis not present

## 2023-07-15 DIAGNOSIS — H401132 Primary open-angle glaucoma, bilateral, moderate stage: Secondary | ICD-10-CM | POA: Diagnosis not present

## 2023-07-16 ENCOUNTER — Ambulatory Visit
Admission: RE | Admit: 2023-07-16 | Discharge: 2023-07-16 | Disposition: A | Discharge status: Home / Self Care | Payer: Medicare HMO | Source: Ambulatory Visit | Attending: Emergency Medicine | Admitting: Emergency Medicine

## 2023-07-16 DIAGNOSIS — J849 Interstitial pulmonary disease, unspecified: Secondary | ICD-10-CM | POA: Diagnosis not present

## 2023-07-16 DIAGNOSIS — I700 Atherosclerosis of aorta: Secondary | ICD-10-CM | POA: Diagnosis not present

## 2023-07-16 DIAGNOSIS — K449 Diaphragmatic hernia without obstruction or gangrene: Secondary | ICD-10-CM | POA: Diagnosis not present

## 2023-07-19 ENCOUNTER — Other Ambulatory Visit: Payer: Medicare HMO

## 2023-08-07 ENCOUNTER — Encounter: Payer: Self-pay | Admitting: Nurse Practitioner

## 2023-08-07 ENCOUNTER — Ambulatory Visit: Payer: Medicare HMO | Admitting: Nurse Practitioner

## 2023-08-07 VITALS — BP 140/90 | HR 87 | Ht 59.0 in | Wt 132.8 lb

## 2023-08-07 DIAGNOSIS — J4520 Mild intermittent asthma, uncomplicated: Secondary | ICD-10-CM | POA: Diagnosis not present

## 2023-08-07 DIAGNOSIS — K224 Dyskinesia of esophagus: Secondary | ICD-10-CM | POA: Diagnosis not present

## 2023-08-07 DIAGNOSIS — J849 Interstitial pulmonary disease, unspecified: Secondary | ICD-10-CM

## 2023-08-07 DIAGNOSIS — R058 Other specified cough: Secondary | ICD-10-CM

## 2023-08-07 DIAGNOSIS — K222 Esophageal obstruction: Secondary | ICD-10-CM

## 2023-08-07 DIAGNOSIS — K219 Gastro-esophageal reflux disease without esophagitis: Secondary | ICD-10-CM | POA: Diagnosis not present

## 2023-08-07 DIAGNOSIS — J309 Allergic rhinitis, unspecified: Secondary | ICD-10-CM

## 2023-08-07 MED ORDER — AZELASTINE HCL 0.1 % NA SOLN
2.0000 | Freq: Two times a day (BID) | NASAL | 5 refills | Status: AC
Start: 2023-08-07 — End: ?

## 2023-08-07 MED ORDER — FLUTICASONE PROPIONATE 50 MCG/ACT NA SUSP: 1.0000 | Freq: Every day | NASAL | 2 refills | Status: AC

## 2023-08-07 NOTE — Patient Instructions (Signed)
 Continue Albuterol inhaler 2 puffs every 6 hours as needed for shortness of breath or wheezing. Notify if symptoms persist despite rescue inhaler/neb use. Restart daily allergy pill such as claritin, zyrtec, xyzal  Continue Astelin nasal spray 2 sprays each nostril Twice daily for nasal drainage/congestion Restart flonase 1 spray each nostril daily for sinus congestion/post nasal drainage. If this causes you to have headaches you can stop. You can also skip it on days you're not have any throat clearing/nasal drainage    High resolution CT chest in one year  Referral to gastroenterology placed for esophageal dysmotility which is where the esophagus doesn't move like it's supposed to and can cause problems with reflux and potential aspiration Avoid eating 2-3 hours before bed Continue current reflux regimen    Follow up with Dr. Delton Coombes in March 2026 after CT chest. If symptoms do not improve or worsen, please contact office for sooner follow up or seek emergency care.

## 2023-08-07 NOTE — Progress Notes (Unsigned)
 @Patient  ID: Anna Arnold, female    DOB: 1951/02/01, 73 y.o.   MRN: [id number]  Chief Complaint  Patient presents with   Follow-up    Referring provider: Jackelyn Poling, DO  HPI: 73 year old female, never smoker followed for asthma and ILD.  She is a patient Dr. Kavin Leech and last seen in office 07/24/2022.  Past medical history significant for spondylosis and DJD, possible autoimmune disease although serologies negative in April 2021 except for weakly positive ANA, hypertension, sleep apnea, GERD, fibromyalgia, HLD, anxiety, heart murmur, IDA, hiatal hernia.  TEST/EVENTS:  09/25/2019 PFTs: FVC 116, FEV1 104, ratio 99, TLC 116, DLCOcor 101.  No BD; there was mid flow reversibility. 07/07/2021 HRCT chest: Atherosclerosis.  No LAD.  There are some very mild areas of peripheral predominant groundglass attenuation, septal thickening, subpleural reticulation, mild cylindrical traction bronchiectasis and peripheral bronchiolectasis along with some areas of peripheral mucoid impaction within the terminal bronchioles.  No frank honeycombing is present.  Most evident in the lung bases.  There is minimal air trapping.  There is also a 5 mm groundglass nodule in the left lower lobe which is new.  Findings are overall stable and once again categorized as probable UIP; however could also consider NSIP.  Recommended repeat in 12 months. 11/15/2021 echocardiogram: EF 60 to 65%.  G1 DD.  RV size and function is normal.  Unable to measure PASP.  No valvular abnormalities noted. 01/26/2022 PFT: FVC 108, FEV1 102, ratio 74, TLC 92, DLCOunc 96 07/07/2022 HRCT chest: stable ILD, probable UIP vs fibrotic phase NSIP given stability. Mild air trapping. Mild tracheobronchomalacia. Atherosclerosis. Aortic valve calcifications. Chronic compression fractures 07/16/2023 HRCT chest: atherosclerosis. Mediastinal lympho nodes, up to 10 mm and stable. Stable ILD when compared to prior imaging. Scattered peribronchovascular nodularity  and mucoid impaction, postinfectious and stable. Moderate to large hiatal hernia. Esophageal dysmotility.   01/19/2021: OV with Dr. Delton Coombes.  Previous mild obstruction on PFT, suspected mixed disease with some very mild peripheral septal thickening and associated inflammatory change without frank honeycombing.  Due to mild obstruction, placed on trial of Breo at last visit.  She initially thought that it was helping but in the end did not feel like it significantly changed her breathing so it was stopped.  She also had some upper airway irritation and difficulty swallowing.  Felt as though it made her GERD worse as well.  She had COVID in July 2022 with fatigue and myalgias, no real cough or URI symptoms.  Advised to increase exercise and conditioning.  Her ILD was very subtle on previous imaging.  Question whether some of this is GERD, microaspiration.  Plan to repeat in March and if there is any progression then she will be referred to ILD clinic.  12/06/2021: OV with Sabir Charters NP for follow-up.  She reports that over the last month or so she has been feeling a little more short of breath with activity, especially when she goes outside.  She first noticed this when they were in [place] a few weeks ago and that the humidity was terrible.  Felt like every time she would walk outside, chest would get a little tight and she would feel more winded.  She has also been dealing with a dry, persistent cough.  She has had increased reflux recently, despite taking Protonix.  She has also had nasal congestion with postnasal drip and sneezes often.  Unsure if she is allergic to something or if she needs to return to an allergist, who she  was seen by years ago.  She denies any wheezing, hemoptysis, lower extremity edema, fevers, night sweats, anorexia, weight loss.  She denies any worsening arthralgias or myalgias, no joint stiffness, no dry eyes or mouth. She has not tried her albuterol inhaler and shortness of breath resolves with  rest. She previously tried [employer] but DPI made her cough more and she had throat irritation, and didn't notice a change in her breathing. She is on protonix 20 mg daily. She used Zyrtec for a little while with some relief. She was previously on flonase but her ophthalmologist wanted her to stop this in the past due to her glaucoma. FeNO 17 ppb. CXR with mild interstitial pattern; noted on HRCT from February. Allergen panel negative. PFTs ordered for further evaluation.   01/26/2022: OV with Gracin Mcpartland NP for follow up after pulmonary function testing.  Pulmonary function testing was overall normal.  She did have some mid flow reversibility and lung volumes were reduced when compared to years ago but still considered normal at 92%; otherwise no significant findings.  She reports that her breathing is relatively the same as it was last time she was here.  She gets winded with long distances or climbing; also if it is hot outside, she feels like she gets more short of breath. Her biggest concern over the past month or so has been worsening joint pain and increased stiffness, especially in her neck and hands.  She is worried that the Protonix that she has been on has caused worsening osteoarthritis.  She is also worried that she may have rheumatoid arthritis.  She denies any redness to her joints but does notice some occasional swelling in her left ankle, which has been ongoing for years now.  It does seem to hurt her a little bit more than it used to.  She also feels like her fatigue has gotten a little bit worse; unsure if this is related to pain or not sleeping as well.  She denies any muscle weakness, dry eyes, dry mouth, skin tightening.  Cough has improved since last time she was here.  Feels like it does not really bother her much anymore.  Still has occasional nasal congestion and throat clearing. Denies any wheezing, chest congestion, hemoptysis, fevers, orthopnea.  She was previously tried on [place] and did not notice  much of a difference.  She has not tried albuterol.  She had trouble with reflux at last visit this so we increased her Protonix to 40 mg daily.  07/24/2022: OV with Dr. Delton Coombes. Mixed disease on PFT. Previous reassuring rheum evaluation. Subtle ILD changes on CT scan, stable. Did not get a significant clinical benefit from [facility]; how off scheduled ICS/LABA. Never needs albuterol. Will get some exertional SOB in summer months. Having a lot of throat clearing, dry cough. Has astelin; stopped using it because it gave her some blurred vision. CT stable >1 year; repeat 1 year. Suspect post COVID related. PFTs stable.   08/07/2023: Today - follow up Patient presents today for follow up. Had HRCT chest 07/16/2023 for yearly monitoring; stable without any evidence of progression compared to prior testing. Probable UIP but previously felt to be post COVID changes. She did have some trouble with her breathing/cough earlier this year. She had URI early January with a cough then contracted influenza A late January. She had ED evaluation which was unremarkable. Supportive care measures were advised. She's since recovered and is feeling better. Seems to be back to her normal aside from  a little more postnasal drainage and throat clearing with recent change in weather. No significant cough. No issues with her breathing. No wheezing, fevers, chills, hemoptysis, headaches, sore throat, facial tenderness. Using astelin again without issues. Not taking an allergy pill. Hasn't had to use her albuterol. She did have evidence of esophageal dysmotility on imaging. She's on medications for reflux, which help. She has had an esophageal dilation in the past. Has not seen GI in quite some time. No s/s of aspiration, difficulties swallowing.   Allergies  Allergen Reactions   Brimonidine Other (See Comments)    swelling    Ceftin [Cefuroxime Axetil]     Diarrhea and vomiting   Netarsudil Other (See Comments)   Netarsudil Dimesylate  Dermatitis, Itching, Swelling and Other (See Comments)    In eyes, itching     Viibryd [Vilazodone Hcl]     agitation   Benzonatate Nausea Only   Amitriptyline     sedation   Atenolol     fatigue   Augmentin [Amoxicillin-Pot Clavulanate] Nausea Only    GI pain   Biaxin [Clarithromycin] Nausea Only    GI pain   Brinzolamide Anxiety   Chlorthalidone     cramping   Citalopram Hydrobromide Other (See Comments)    fatigue   Erythromycin Rash   Fluoxetine Other (See Comments)    agitation   Lexapro [Escitalopram Oxalate]     Agitation and nausea   Meloxicam Nausea Only    Nausea    Potassium Chloride     Abdominal pain   Reglan [Metoclopramide]     Abdominal pain   Sertraline Hcl Other (See Comments)    fatigue   Simvastatin     Joint and muscle pain   Singulair [Montelukast Sodium]     Severe agitation, pruritis    Travoprost    Zegerid [Omeprazole] Nausea Only    nausea    Immunization History  Administered Date(s) Administered   Influenza Split 02/24/2019   Influenza, High Dose Seasonal PF 03/31/2018, 04/14/2020, 04/07/2021   Influenza,inj,Quad PF,6+ Mos 02/20/2016, 03/04/2017   PFIZER(Purple Top)SARS-COV-2 Vaccination 07/21/2019, 08/13/2019, 03/26/2020, 10/27/2020   Pfizer Covid-19 Vaccine Bivalent Booster 56yrs & up 06/16/2021   Pneumococcal Conjugate-13 04/13/2014   Pneumococcal Polysaccharide-23 11/09/2015   Tdap 07/31/2010, 04/07/2021   Zoster Recombinant(Shingrix) 06/03/2017, 11/18/2017   Zoster, Live 11/20/2010, 06/03/2017, 11/18/2017    Past Medical History:  Diagnosis Date   Anxiety    Arthritis    Cataracts, bilateral    Cervical spondylosis    Degenerative disc disease, lumbar    Depression    Fibromyalgia    GERD (gastroesophageal reflux disease)    Glaucoma    Hypertension    Migraines    Osteopenia    Seasonal allergies    Sleep disorder     Tobacco History: Social History   Tobacco Use  Smoking Status Never   Passive  exposure: Past  Smokeless Tobacco Never   Counseling given: Not Answered   Outpatient Medications Prior to Visit  Medication Sig Dispense Refill   acetaminophen (TYLENOL) 500 MG tablet 2 tablets Orally once a day scheduled     ACETAMINOPHEN PO Take 650 mg by mouth as needed.     albuterol (VENTOLIN HFA) 108 (90 Base) MCG/ACT inhaler INHALE TWO PUFFS BY MOUTH EVERY 6 HOURS AS NEEDED FOR WHEEZING AND FOR SHORTNESS OF BREATH 18 g 5   alendronate (FOSAMAX) 70 MG tablet Take 70 mg by mouth once a week. Take with a full glass of  water on an empty stomach.     buPROPion (WELLBUTRIN SR) 100 MG 12 hr tablet Take 100 mg by mouth daily.     Cholecalciferol (VITAMIN D3) 1000 units CAPS Take by mouth.     Coenzyme Q10 (COQ10) 200 MG CAPS Take 200 mg by mouth 2 (two) times daily.     Evolocumab (REPATHA SURECLICK) 140 MG/ML SOAJ Inject 140 mg into the skin every 14 (fourteen) days. 6 mL 3   Gluc-Chonn-MSM-Boswellia-Vit D (GLUCOSAMINE CHOND TRIPLE/VIT D PO) Take 1 tablet by mouth daily.     losartan (COZAAR) 100 MG tablet Take 100 mg by mouth daily.     Multiple Minerals-Vitamins (CALCIUM-MAGNESIUM-ZINC-D3 PO) Take by mouth.     Omega-3 Fatty Acids (ULTRA OMEGA-3 FISH OIL) 1400 MG CAPS      pantoprazole (PROTONIX) 20 MG tablet TAKE 1 TABLET BY MOUTH DAILY 90 tablet 0   SUMAtriptan (IMITREX) 100 MG tablet Take 100 mg by mouth every 2 (two) hours as needed for migraine. Take 1/2 tablet for migraine. May repeat in 2 hours if headache persists or recurs.     azelastine (ASTELIN) 0.1 % nasal spray Place 2 sprays into both nostrils 2 (two) times daily. Use in each nostril as directed 30 mL 5   ABRYSVO 120 MCG/0.5ML injection Inject 0.5 mLs into the muscle once. (Patient not taking: Reported on 03/29/2023)     benzonatate (TESSALON) 200 MG capsule Take 200 mg by mouth as needed.     COMIRNATY syringe Inject 0.3 mLs into the muscle once. (Patient not taking: Reported on 03/29/2023)     meclizine (ANTIVERT) 25 MG  tablet Take 25 mg by mouth as needed for dizziness.     Facility-Administered Medications Prior to Visit  Medication Dose Route Frequency Provider Last Rate Last Admin   0.9 %  sodium chloride infusion  500 mL Intravenous Continuous Nandigam, Kavitha V, MD         Review of Systems:   Constitutional: No weight loss or gain, night sweats, fevers, chills, fatigue, or lassitude. HEENT: No headaches, difficulty swallowing, tooth/dental problems, or sore throat. No itching, ear ache, sneezing +nasal congestion, post nasal drip, throat clearing  CV:  No chest pain, orthopnea, PND, swelling in lower extremities, anasarca, dizziness, palpitations, syncope Resp: No shortness of breath with exertion. No cough. No excess mucus or change in color of mucus. No hemoptysis. No wheezing.  No chest wall deformity GI:  No heartburn, indigestion, abdominal pain, nausea, vomiting, diarrhea, change in bowel habits, loss of appetite, bloody stools.  Skin: No rash, lesions, ulcerations MSK:   No decreased range of motion.  +chronic neck and back pain. Neuro: No dizziness or lightheadedness.  Psych: No depression or anxiety. Mood stable.     Physical Exam:  BP (!) 140/90 (BP Location: Right Arm, Patient Position: Sitting)   Pulse 87   Ht 4\' 11"  (1.499 m)   Wt 132 lb 12.8 oz (60.2 kg)   SpO2 97%   BMI 26.82 kg/m   GEN: Pleasant, interactive, well-appearing; in no acute distress. HEENT:  Normocephalic and atraumatic. PERRLA. Sclera white. Nasal turbinates boggy, moist and patent bilaterally. No rhinorrhea present. Oropharynx erythematous and moist, without exudate or edema. No lesions, ulcerations  NECK:  Supple w/ fair ROM. No JVD present. Normal carotid impulses w/o bruits. Thyroid symmetrical with no goiter or nodules palpated. No lymphadenopathy.   CV: RRR, no m/r/g, no peripheral edema. Pulses intact, +2 bilaterally. No cyanosis, pallor or clubbing. PULMONARY:  Unlabored, regular  breathing. Clear  bilaterally A&P w/o wheezes/rales/rhonchi. No accessory muscle use. No dullness to percussion. GI: BS present and normoactive. Soft, non-tender to palpation. No organomegaly or masses detected.  MSK: No erythema, warmth or tenderness. Cap refil <2 sec all extrem. Swan neck appearance to right #2 and 3 fingers. Deformity #2 DIP left  Neuro: A/Ox3. No focal deficits noted.   Skin: Warm, no lesions or rashe Psych: Normal affect and behavior. Judgement and thought content appropriate.     Lab Results:  CBC    Component Value Date/Time   WBC 5.7 06/23/2023 1231   RBC 4.57 06/23/2023 1231   HGB 13.0 06/23/2023 1231   HGB 14.5 09/03/2006 1318   HCT 40.0 06/23/2023 1231   HCT 42.6 09/03/2006 1318   PLT 221 06/23/2023 1231   PLT 201 09/03/2006 1318   MCV 87.5 06/23/2023 1231   MCV 87 09/03/2006 1318   MCH 28.4 06/23/2023 1231   MCHC 32.5 06/23/2023 1231   RDW 14.2 06/23/2023 1231   RDW 13.1 09/03/2006 1318   LYMPHSABS 1.2 12/06/2021 1135   LYMPHSABS 2.1 09/03/2006 1318   MONOABS 0.6 12/06/2021 1135   EOSABS 0.1 12/06/2021 1135   EOSABS 0.1 09/03/2006 1318   BASOSABS 0.0 12/06/2021 1135   BASOSABS 0.0 09/03/2006 1318    BMET    Component Value Date/Time   NA 136 06/23/2023 1231   K 3.5 06/23/2023 1231   CL 99 06/23/2023 1231   CO2 28 06/23/2023 1231   GLUCOSE 97 06/23/2023 1231   BUN 20 06/23/2023 1231   CREATININE 1.04 (H) 06/23/2023 1231   CALCIUM 9.5 06/23/2023 1231   GFRNONAA 57 (L) 06/23/2023 1231   GFRAA >60 07/06/2019 1057    BNP No results found for: "BNP"   Imaging:  CT Chest High Resolution Result Date: 08/02/2023 CLINICAL DATA:  Interstitial lung disease. EXAM: CT CHEST WITHOUT CONTRAST TECHNIQUE: Multidetector CT imaging of the chest was performed following the standard protocol without intravenous contrast. High resolution imaging of the lungs, as well as inspiratory and expiratory imaging, was performed. RADIATION DOSE REDUCTION: This exam was performed  according to the departmental dose-optimization program which includes automated exposure control, adjustment of the mA and/or kV according to patient size and/or use of iterative reconstruction technique. COMPARISON:  07/07/2022, 07/07/2021, 07/13/2020. FINDINGS: Cardiovascular: Atherosclerotic calcification of the aorta and left anterior descending coronary artery. Heart size normal. No pericardial effusion. Mediastinum/Nodes: Mediastinal lymph nodes measure up to 10 mm in the low right paratracheal station, unchanged. Hilar regions are difficult to definitively evaluate without IV contrast. No axillary adenopathy. Fluid in a dilated esophagus can be seen with dysmotility. Lungs/Pleura: Scattered clustered peribronchovascular nodularity and mucoid impaction. Basilar coarsened subpleural ground-glass and subpleural reticular densities. Minimal traction bronchiolectasis. No definitive honeycombing. Some slight sparing of the posterior costophrenic angles. Findings are stable from 07/07/2022. No pleural fluid. Airway is unremarkable. No air trapping. Upper Abdomen: Small hepatic cysts. No specific follow-up necessary. Slight thickening of the body of the left adrenal gland. No specific follow-up necessary. Moderate to large hiatal hernia. Visualized portions of the liver, adrenal glands, kidneys, spleen, pancreas, stomach and bowel are otherwise grossly unremarkable. No upper abdominal adenopathy. Musculoskeletal: Degenerative changes in the spine. Old lower thoracic compression fractures. IMPRESSION: 1. Pulmonary parenchymal pattern of basilar predominant interstitial lung disease, as described above, grossly stable from 07/07/2022. Findings may be due to fibrotic nonspecific interstitial pneumonitis. Findings are categorized as probable UIP per consensus guidelines: Diagnosis of Idiopathic Pulmonary Fibrosis: An Official ATS/ERS/JRS/ALAT  Clinical Practice Guideline. Am Rosezetta Schlatter Crit Care Med Vol 198, Iss 5,  [phone], Jan 26 2017. 2. Scattered peribronchovascular nodularity and mucoid impaction, postinfectious in etiology or indicative of chronic mycobacterium avium complex. 3. Moderate to large hiatal hernia. 4. Aortic atherosclerosis (ICD10-I70.0). Left anterior descending coronary artery calcification. Electronically Signed   By: Leanna Battles M.D.   On: 08/02/2023 16:37    Administration History     None          Latest Ref Rng & Units 01/26/2022    8:55 AM 09/25/2019   12:48 PM  PFT Results  FVC-Pre L 2.69  2.99   FVC-Predicted Pre % 108  116   FVC-Post L 2.76  2.96   FVC-Predicted Post % 110  115   Pre FEV1/FVC % % 71  68   Post FEV1/FCV % % 74  74   FEV1-Pre L 1.91  2.03   FEV1-Predicted Pre % 102  104   FEV1-Post L 2.05  2.18   DLCO uncorrected ml/min/mmHg 16.21  17.19   DLCO UNC% % 96  101   DLCO corrected ml/min/mmHg 15.88  17.19   DLCO COR %Predicted % 94  101   DLVA Predicted % 83  91   TLC L 4.10  5.18   TLC % Predicted % 92  116   RV % Predicted % 62  116     Lab Results  Component Value Date   NITRICOXIDE 17 12/06/2021        Assessment & Plan:   ILD (interstitial lung disease) (HCC) Stable disease on recent imaging. Probable UIP. Prior serologies negative. Onset following COVID infection July 2022. No evidence of progression since 2023's initial scan. Clinically stable. Continue to monitor. Repeat in 1 year with repeat HRCT chest. Monitor symptoms for evidence of disease progression.   Patient Instructions  Continue Albuterol inhaler 2 puffs every 6 hours as needed for shortness of breath or wheezing. Notify if symptoms persist despite rescue inhaler/neb use. Restart daily allergy pill such as claritin, zyrtec, xyzal  Continue Astelin nasal spray 2 sprays each nostril Twice daily for nasal drainage/congestion Restart flonase 1 spray each nostril daily for sinus congestion/post nasal drainage. If this causes you to have headaches you can stop. You can  also skip it on days you're not have any throat clearing/nasal drainage    High resolution CT chest in one year  Referral to gastroenterology placed for esophageal dysmotility which is where the esophagus doesn't move like it's supposed to and can cause problems with reflux and potential aspiration Avoid eating 2-3 hours before bed Continue current reflux regimen    Follow up with Dr. Delton Coombes in March 2026 after CT chest. If symptoms do not improve or worsen, please contact office for sooner follow up or seek emergency care.    Allergic rhinitis Slight exacerbation with change in season. Add on intranasal steroid spray and restart daily non drowsy antihistamine.   ESOPHAGEAL STRICTURE Hx of stricture. Dysmotility on imaging. Chronic throat clearing, possibly exacerbated by GERD although she feels her symptoms are well managed right now. Reviewed aspiration/GERD precautions. Referral placed to GI to re-establish care and further management.   Asthma Stable. No evidence of acute exacerbation. Compensated on current regimen. Action plan in place.     I spent 35 minutes of dedicated to the care of this patient on the date of this encounter to include pre-visit review of records, face-to-face time with the patient discussing conditions above, post visit  ordering of testing, clinical documentation with the electronic health record, making appropriate referrals as documented, and communicating necessary findings to members of the patients care team.  Noemi Chapel, NP 08/08/2023  Pt aware and understands NP's role.

## 2023-08-08 ENCOUNTER — Encounter: Payer: Self-pay | Admitting: Nurse Practitioner

## 2023-08-08 NOTE — Assessment & Plan Note (Signed)
 Stable. No evidence of acute exacerbation. Compensated on current regimen. Action plan in place.

## 2023-08-08 NOTE — Assessment & Plan Note (Signed)
 Hx of stricture. Dysmotility on imaging. Chronic throat clearing, possibly exacerbated by GERD although she feels her symptoms are well managed right now. Reviewed aspiration/GERD precautions. Referral placed to GI to re-establish care and further management.

## 2023-08-08 NOTE — Assessment & Plan Note (Signed)
 Slight exacerbation with change in season. Add on intranasal steroid spray and restart daily non drowsy antihistamine.

## 2023-08-08 NOTE — Assessment & Plan Note (Signed)
 Stable disease on recent imaging. Probable UIP. Prior serologies negative. Onset following COVID infection July 2022. No evidence of progression since 2023's initial scan. Clinically stable. Continue to monitor. Repeat in 1 year with repeat HRCT chest. Monitor symptoms for evidence of disease progression.   Patient Instructions  Continue Albuterol inhaler 2 puffs every 6 hours as needed for shortness of breath or wheezing. Notify if symptoms persist despite rescue inhaler/neb use. Restart daily allergy pill such as claritin, zyrtec, xyzal  Continue Astelin nasal spray 2 sprays each nostril Twice daily for nasal drainage/congestion Restart flonase 1 spray each nostril daily for sinus congestion/post nasal drainage. If this causes you to have headaches you can stop. You can also skip it on days you're not have any throat clearing/nasal drainage    High resolution CT chest in one year  Referral to gastroenterology placed for esophageal dysmotility which is where the esophagus doesn't move like it's supposed to and can cause problems with reflux and potential aspiration Avoid eating 2-3 hours before bed Continue current reflux regimen    Follow up with Dr. Delton Coombes in March 2026 after CT chest. If symptoms do not improve or worsen, please contact office for sooner follow up or seek emergency care.

## 2023-08-22 DIAGNOSIS — M79672 Pain in left foot: Secondary | ICD-10-CM | POA: Diagnosis not present

## 2023-08-22 DIAGNOSIS — M79671 Pain in right foot: Secondary | ICD-10-CM | POA: Diagnosis not present

## 2023-08-22 DIAGNOSIS — L659 Nonscarring hair loss, unspecified: Secondary | ICD-10-CM | POA: Diagnosis not present

## 2023-09-18 DIAGNOSIS — R5383 Other fatigue: Secondary | ICD-10-CM | POA: Diagnosis not present

## 2023-09-18 DIAGNOSIS — M810 Age-related osteoporosis without current pathological fracture: Secondary | ICD-10-CM | POA: Diagnosis not present

## 2023-09-18 DIAGNOSIS — F325 Major depressive disorder, single episode, in full remission: Secondary | ICD-10-CM | POA: Diagnosis not present

## 2023-09-18 DIAGNOSIS — I10 Essential (primary) hypertension: Secondary | ICD-10-CM | POA: Diagnosis not present

## 2023-10-03 ENCOUNTER — Telehealth: Payer: Self-pay | Admitting: Cardiology

## 2023-10-03 NOTE — Telephone Encounter (Signed)
 Pt c/o medication issue:  1. Name of Medication: Evolocumab  (REPATHA  SURECLICK) 140 MG/ML SOAJ   2. How are you currently taking this medication (dosage and times per day)? 140 mg, Every 14 days   3. Are you having a reaction (difficulty breathing--STAT)? No  4. What is your medication issue? Pt will be on cruise when due again and wants to know if ok to skip a week. Requesting cb

## 2023-10-04 ENCOUNTER — Ambulatory Visit: Admitting: Physician Assistant

## 2023-10-04 ENCOUNTER — Encounter: Payer: Self-pay | Admitting: Physician Assistant

## 2023-10-04 VITALS — BP 126/80 | HR 88 | Ht 59.0 in | Wt 133.0 lb

## 2023-10-04 DIAGNOSIS — K449 Diaphragmatic hernia without obstruction or gangrene: Secondary | ICD-10-CM

## 2023-10-04 DIAGNOSIS — D509 Iron deficiency anemia, unspecified: Secondary | ICD-10-CM

## 2023-10-04 DIAGNOSIS — R0989 Other specified symptoms and signs involving the circulatory and respiratory systems: Secondary | ICD-10-CM

## 2023-10-04 DIAGNOSIS — K219 Gastro-esophageal reflux disease without esophagitis: Secondary | ICD-10-CM

## 2023-10-04 MED ORDER — PANTOPRAZOLE SODIUM 20 MG PO TBEC: 20.0000 mg | DELAYED_RELEASE_TABLET | Freq: Two times a day (BID) | ORAL | 5 refills | Status: DC

## 2023-10-04 NOTE — Telephone Encounter (Signed)
 Repatha  is good at room temperature for 30 days. She can bring it with her on her carry on. But if she really does not want to take it with her, she can give the shot as soon as she returns and then get on a new every 2 week schedule.

## 2023-10-04 NOTE — Patient Instructions (Addendum)
 We have sent the following medications to your pharmacy for you to pick up at your convenience: Pantoprazole  20 mg twice daily 30-60 minutes before breakfast and dinner.   Follow up in 2-3 months.   _______________________________________________________  If your blood pressure at your visit was 140/90 or greater, please contact your primary care physician to follow up on this.  _______________________________________________________  If you are age 73 or older, your body mass index should be between 23-30. Your Body mass index is 26.86 kg/m. If this is out of the aforementioned range listed, please consider follow up with your Primary Care Provider.  If you are age 50 or younger, your body mass index should be between 19-25. Your Body mass index is 26.86 kg/m. If this is out of the aformentioned range listed, please consider follow up with your Primary Care Provider.   ________________________________________________________  The [facility] providers would like to encourage you to use MYCHART to communicate with providers for non-urgent requests or questions.  Due to long hold times on the telephone, sending your provider a message by [facility] may be a faster and more efficient way to get a response.  Please allow 48 business hours for a response.  Please remember that this is for non-urgent requests.  _______________________________________________________

## 2023-10-04 NOTE — Telephone Encounter (Signed)
 Patient is returning phone call.

## 2023-10-04 NOTE — Telephone Encounter (Signed)
 Left voicemail to return call to office

## 2023-10-04 NOTE — Progress Notes (Signed)
 Chief Complaint: GERD, throat clearing  HPI:    Anna Arnold is a 73 year old female with a past medical history as listed below including fibromyalgia and GERD, known to Dr. Leonia Raman, who was referred to me by Mordechai April, DO for a complaint of GERD and throat clearing.      06/28/2016 colonoscopy with Dr. Leonia Raman with diverticulosis in the sigmoid and descending colon, nonbleeding internal hemorrhoids and otherwise normal.  Repeat recommended 10 years.    06/23/2023 CBC normal.  BMP with a creatinine of 1.04 and otherwise normal.    07/16/2023 CT of the chest showed small hepatic cysts, slight thickening of the body of the left adrenal gland, moderate to large hiatal hernia and otherwise unremarkable.    Today, patient presents to clinic and discusses that she is having some throat clearing issues and reflux.  She just went to see her pulmonologist who gave her some nasal spray which seems to help a little bit with the throat clearing but she continues to have reflux, worse at night if she eats too much or eats too late or eats sweets after 9 then she is chewing Tums regardless taking Pantoprazole  20 mg daily.  Knows that she has a moderate to large hiatal hernia on imaging.  Tells me she gets very full fast, seems worse over the past year and relates it to this hernia.    Does briefly tell me that she was just diagnosed with iron deficiency anemia, apparently to start an over-the-counter iron supplement, this has not been worked up any further.    Patient going on a [place] cruise for her 50th anniversary.    Denies fever, chills or weight loss.  Past Medical History:  Diagnosis Date   Anxiety    Arthritis    Cataracts, bilateral    Cervical spondylosis    Degenerative disc disease, lumbar    Depression    Fibromyalgia    GERD (gastroesophageal reflux disease)    Glaucoma    Hypertension    Migraines    Osteopenia    Seasonal allergies    Sleep disorder     Past Surgical History:   Procedure Laterality Date   bunionectomy Right 2008   CATARACT EXTRACTION Bilateral 2015-2016   CESAREAN SECTION  1982   GLAUCOMA SURGERY Bilateral 2013   HAMMER TOE SURGERY Right 2008   ROTATOR CUFF REPAIR Right 2012    Current Outpatient Medications  Medication Sig Dispense Refill   ABRYSVO 120 MCG/0.5ML injection Inject 0.5 mLs into the muscle once. (Patient not taking: Reported on 03/29/2023)     acetaminophen (TYLENOL) 500 MG tablet 2 tablets Orally once a day scheduled     ACETAMINOPHEN PO Take 650 mg by mouth as needed.     albuterol  (VENTOLIN  HFA) 108 (90 Base) MCG/ACT inhaler INHALE TWO PUFFS BY MOUTH EVERY 6 HOURS AS NEEDED FOR WHEEZING AND FOR SHORTNESS OF BREATH 18 g 5   alendronate (FOSAMAX) 70 MG tablet Take 70 mg by mouth once a week. Take with a full glass of water on an empty stomach.     azelastine  (ASTELIN ) 0.1 % nasal spray Place 2 sprays into both nostrils 2 (two) times daily. Use in each nostril as directed 30 mL 5   benzonatate  (TESSALON ) 200 MG capsule Take 200 mg by mouth as needed.     buPROPion (WELLBUTRIN SR) 100 MG 12 hr tablet Take 100 mg by mouth daily.     Cholecalciferol (VITAMIN D3) 1000 units CAPS Take  by mouth.     Coenzyme Q10 (COQ10) 200 MG CAPS Take 200 mg by mouth 2 (two) times daily.     COMIRNATY syringe Inject 0.3 mLs into the muscle once. (Patient not taking: Reported on 03/29/2023)     Evolocumab  (REPATHA  SURECLICK) 140 MG/ML SOAJ Inject 140 mg into the skin every 14 (fourteen) days. 6 mL 3   fluticasone  (FLONASE ) 50 MCG/ACT nasal spray Place 1 spray into both nostrils daily. 18.2 mL 2   Gluc-Chonn-MSM-Boswellia-Vit D (GLUCOSAMINE CHOND TRIPLE/VIT D PO) Take 1 tablet by mouth daily.     losartan (COZAAR) 100 MG tablet Take 100 mg by mouth daily.     meclizine  (ANTIVERT ) 25 MG tablet Take 25 mg by mouth as needed for dizziness.     Multiple Minerals-Vitamins (CALCIUM -MAGNESIUM-ZINC-D3 PO) Take by mouth.     Omega-3 Fatty Acids (ULTRA OMEGA-3  FISH OIL) 1400 MG CAPS      pantoprazole  (PROTONIX ) 20 MG tablet TAKE 1 TABLET BY MOUTH DAILY 90 tablet 0   SUMAtriptan (IMITREX) 100 MG tablet Take 100 mg by mouth every 2 (two) hours as needed for migraine. Take 1/2 tablet for migraine. May repeat in 2 hours if headache persists or recurs.     Current Facility-Administered Medications  Medication Dose Route Frequency Provider Last Rate Last Admin   0.9 %  sodium chloride  infusion  500 mL Intravenous Continuous Nandigam, Kavitha V, MD        Allergies as of 10/04/2023 - Review Complete 08/08/2023  Allergen Reaction Noted   Brimonidine Other (See Comments) 08/31/2020   Ceftin [cefuroxime axetil]  02/13/2016   Netarsudil Other (See Comments) 08/31/2020   Netarsudil dimesylate Dermatitis, Itching, Swelling, and Other (See Comments) 11/19/2019   Viibryd [vilazodone hcl]  02/13/2016   Benzonatate  Nausea Only 08/07/2023   Amitriptyline  02/13/2016   Atenolol  02/13/2016   Augmentin [amoxicillin-pot clavulanate] Nausea Only 02/13/2016   Biaxin [clarithromycin] Nausea Only 02/13/2016   Brinzolamide Anxiety 12/09/2019   Chlorthalidone  02/13/2016   Citalopram hydrobromide Other (See Comments) 02/13/2016   Erythromycin Rash 04/30/2008   Fluoxetine Other (See Comments) 02/13/2016   Lexapro [escitalopram oxalate]  02/13/2016   Meloxicam Nausea Only 02/13/2016   Potassium chloride  02/13/2016   Reglan [metoclopramide]  02/13/2016   Sertraline hcl Other (See Comments) 02/13/2016   Simvastatin  02/13/2016   Singulair [montelukast sodium]  02/13/2016   Travoprost  12/09/2019   Zegerid [omeprazole] Nausea Only 02/13/2016    Family History  Problem Relation Age of Onset   Cancer Mother    Depression Mother    Heart disease Father    Heart attack Brother    Depression Brother    Colon cancer Neg Hx    Colon polyps Neg Hx    Esophageal cancer Neg Hx    Rectal cancer Neg Hx    Stomach cancer Neg Hx     Social History   Socioeconomic  History   Marital status: Married    Spouse name: Not on file   Number of children: Not on file   Years of education: Not on file   Highest education level: Not on file  Occupational History   Not on file  Tobacco Use   Smoking status: Never    Passive exposure: Past   Smokeless tobacco: Never  Substance and Sexual Activity   Alcohol use: No   Drug use: No   Sexual activity: Not on file  Other Topics Concern   Not on file  Social  History Narrative   Not on file   Social Drivers of Health   Financial Resource Strain: Not on file  Food Insecurity: Not on file  Transportation Needs: Not on file  Physical Activity: Not on file  Stress: No Stress Concern Present (01/11/2021)   Received from [facility], [facility]   [employer] [facility] - Occupational Stress Questionnaire    Feeling of Stress : Not at all  Social Connections: Unknown (10/10/2021)   Received from [facility], [facility]   Social Network    Social Network: Not on file  Intimate Partner Violence: Unknown (09/01/2021)   Received from [facility], [facility]   HITS    Physically Hurt: Not on file    Insult or Talk Down To: Not on file    Threaten Physical Harm: Not on file    Scream or Curse: Not on file    Review of Systems:    Constitutional: No weight loss, fever or chills Skin: No rash  Cardiovascular: No chest pain Respiratory: No SOB Gastrointestinal: See HPI and otherwise negative Genitourinary: No dysuria  Neurological: No headache, dizziness or syncope Musculoskeletal: No new muscle or joint pain Hematologic: No bleeding Psychiatric: No history of depression or anxiety    Physical Exam:  Vital signs: BP 126/80   Pulse 88   Ht 4\' 11"  (1.499 m)   Wt 133 lb (60.3 kg)   BMI 26.86 kg/m    Constitutional:   Pleasant Caucasian female appears to be in NAD, Well developed, Well nourished, alert and cooperative Respiratory: Respirations even and unlabored. Lungs  clear to auscultation bilaterally.   No wheezes, crackles, or rhonchi.  Cardiovascular: Normal S1, S2. No MRG. Regular rate and rhythm. No peripheral edema, cyanosis or pallor.  Gastrointestinal:  Soft, nondistended, nontender. No rebound or guarding. Normal bowel sounds. No appreciable masses or hepatomegaly. Rectal:  Not performed.  Psychiatric: Oriented to person, place and time. Demonstrates good judgement and reason without abnormal affect or behaviors.  RELEVANT LABS AND IMAGING: CBC    Component Value Date/Time   WBC 5.7 06/23/2023 1231   RBC 4.57 06/23/2023 1231   HGB 13.0 06/23/2023 1231   HGB 14.5 09/03/2006 1318   HCT 40.0 06/23/2023 1231   HCT 42.6 09/03/2006 1318   PLT 221 06/23/2023 1231   PLT 201 09/03/2006 1318   MCV 87.5 06/23/2023 1231   MCV 87 09/03/2006 1318   MCH 28.4 06/23/2023 1231   MCHC 32.5 06/23/2023 1231   RDW 14.2 06/23/2023 1231   RDW 13.1 09/03/2006 1318   LYMPHSABS 1.2 12/06/2021 1135   LYMPHSABS 2.1 09/03/2006 1318   MONOABS 0.6 12/06/2021 1135   EOSABS 0.1 12/06/2021 1135   EOSABS 0.1 09/03/2006 1318   BASOSABS 0.0 12/06/2021 1135   BASOSABS 0.0 09/03/2006 1318    CMP     Component Value Date/Time   NA 136 06/23/2023 1231   K 3.5 06/23/2023 1231   CL 99 06/23/2023 1231   CO2 28 06/23/2023 1231   GLUCOSE 97 06/23/2023 1231   BUN 20 06/23/2023 1231   CREATININE 1.04 (H) 06/23/2023 1231   CALCIUM  9.5 06/23/2023 1231   PROT 6.7 07/06/2019 1057   ALBUMIN 4.2 07/06/2019 1057   AST 31 07/06/2019 1057   ALT 25 07/06/2019 1057   ALKPHOS 68 07/06/2019 1057   BILITOT 0.4 07/06/2019 1057   GFRNONAA 57 (L) 06/23/2023 1231   GFRAA >60 07/06/2019 1057    Assessment: 1.  GERD: Has been on  Pantoprazole  20 mg daily forever, having breakthrough reflux symptoms in the evening depending on what she eats; likely mild reactive gastritis 2.  Throat clearing: Some better after starting a nasal spray from her pulmonologist, also reflux could be  contributing 3.  Hiatal hernia: Seen on CT of the chest, noted is moderate to large, patient with symptoms of early satiety and some uncontrolled reflux 4.  Iron deficiency anemia: Patient describes iron deficiency anemia recently diagnosed and started on oral iron supplementation, I cannot see these labs in the computer; consider relation to hiatal hernia versus other  Plan: 1.  Discussed options with the patient.  Apparently her Pantoprazole  was decreased to once daily dosing over fear for kidney damage in the past, we will trial increasing back to twice daily dosing for now to see if this helps with her ongoing symptoms. 2.  Patient will follow in clinic with me in 2 to 3 months.  At that time if nothing is better we can discuss an EGD for further evaluation as it has been years since her last.  May also need to work up hiatal hernia if this is contributing. 3.  Did offer patient EGD sooner but she is going on a cruise for her 50th anniversary and would like to wait for all of this until she gets back 4.  Will also keep an eye out on her iron, she tells me she was diagnosed with iron deficiency anemia though I cannot see labs in the computer, most recent hemoglobin from January was normal.  We can recheck when she comes back in a few months.  If remains iron deficient she may need an EGD and colonoscopy.  Anna Capra, PA-C  Gastroenterology 10/04/2023, 11:00 AM  Cc: Mordechai April, DO

## 2023-10-04 NOTE — Telephone Encounter (Signed)
 Spoke with the patient and advised on recommendations from PharmD

## 2023-10-11 DIAGNOSIS — T753XXA Motion sickness, initial encounter: Secondary | ICD-10-CM | POA: Diagnosis not present

## 2023-10-11 DIAGNOSIS — R5383 Other fatigue: Secondary | ICD-10-CM | POA: Diagnosis not present

## 2023-10-18 ENCOUNTER — Telehealth: Payer: Self-pay | Admitting: Cardiology

## 2023-10-18 DIAGNOSIS — R0609 Other forms of dyspnea: Secondary | ICD-10-CM | POA: Diagnosis not present

## 2023-10-18 DIAGNOSIS — R42 Dizziness and giddiness: Secondary | ICD-10-CM | POA: Diagnosis not present

## 2023-10-18 NOTE — Telephone Encounter (Signed)
 Patient stated her PCP wants her to have a Stress Test and she wants to get orders.  Patient has appointment scheduled on 6/11.

## 2023-10-21 ENCOUNTER — Other Ambulatory Visit: Payer: Self-pay

## 2023-10-21 ENCOUNTER — Encounter (HOSPITAL_BASED_OUTPATIENT_CLINIC_OR_DEPARTMENT_OTHER): Payer: Self-pay

## 2023-10-21 ENCOUNTER — Emergency Department (HOSPITAL_BASED_OUTPATIENT_CLINIC_OR_DEPARTMENT_OTHER)
Admission: EM | Admit: 2023-10-21 | Discharge: 2023-10-21 | Disposition: A | Discharge status: Home / Self Care | Attending: Emergency Medicine | Admitting: Emergency Medicine

## 2023-10-21 ENCOUNTER — Emergency Department (HOSPITAL_BASED_OUTPATIENT_CLINIC_OR_DEPARTMENT_OTHER)

## 2023-10-21 DIAGNOSIS — I10 Essential (primary) hypertension: Secondary | ICD-10-CM | POA: Diagnosis not present

## 2023-10-21 DIAGNOSIS — J45909 Unspecified asthma, uncomplicated: Secondary | ICD-10-CM | POA: Diagnosis not present

## 2023-10-21 DIAGNOSIS — R6881 Early satiety: Secondary | ICD-10-CM | POA: Insufficient documentation

## 2023-10-21 DIAGNOSIS — J984 Other disorders of lung: Secondary | ICD-10-CM | POA: Diagnosis not present

## 2023-10-21 DIAGNOSIS — R059 Cough, unspecified: Secondary | ICD-10-CM | POA: Diagnosis not present

## 2023-10-21 DIAGNOSIS — Z7951 Long term (current) use of inhaled steroids: Secondary | ICD-10-CM | POA: Insufficient documentation

## 2023-10-21 DIAGNOSIS — Z79899 Other long term (current) drug therapy: Secondary | ICD-10-CM | POA: Insufficient documentation

## 2023-10-21 DIAGNOSIS — R42 Dizziness and giddiness: Secondary | ICD-10-CM | POA: Insufficient documentation

## 2023-10-21 LAB — URINALYSIS, W/ REFLEX TO CULTURE (INFECTION SUSPECTED)
Bilirubin Urine: NEGATIVE
Glucose, UA: NEGATIVE mg/dL
Hgb urine dipstick: NEGATIVE
Ketones, ur: NEGATIVE mg/dL
Leukocytes,Ua: NEGATIVE
Nitrite: NEGATIVE
Protein, ur: NEGATIVE mg/dL
Specific Gravity, Urine: <1.005 — ABNORMAL LOW (ref 1.005–1.030)
pH: 7.5 (ref 5.0–8.0)

## 2023-10-21 LAB — COMPREHENSIVE METABOLIC PANEL WITH GFR
ALT: 14 U/L (ref 0–44)
AST: 26 U/L (ref 15–41)
Albumin: 4.4 g/dL (ref 3.5–5.0)
Alkaline Phosphatase: 67 U/L (ref 38–126)
Anion gap: 15 (ref 5–15)
BUN: 17 mg/dL (ref 8–23)
CO2: 23 mmol/L (ref 22–32)
Calcium: 10 mg/dL (ref 8.9–10.3)
Chloride: 99 mmol/L (ref 98–111)
Creatinine, Ser: 1.04 mg/dL — ABNORMAL HIGH (ref 0.44–1.00)
GFR, Estimated: 56 mL/min — ABNORMAL LOW (ref >60)
Glucose, Bld: 104 mg/dL — ABNORMAL HIGH (ref 70–99)
Potassium: 4.1 mmol/L (ref 3.5–5.1)
Sodium: 137 mmol/L (ref 135–145)
Total Bilirubin: 0.3 mg/dL (ref 0.0–1.2)
Total Protein: 6.9 g/dL (ref 6.5–8.1)

## 2023-10-21 LAB — CBC WITH DIFFERENTIAL/PLATELET
Abs Immature Granulocytes: 0.03 10*3/uL (ref 0.00–0.07)
Basophils Absolute: 0.1 10*3/uL (ref 0.0–0.1)
Basophils Relative: 1 %
Eosinophils Absolute: 0 10*3/uL (ref 0.0–0.5)
Eosinophils Relative: 1 %
HCT: 40.4 % (ref 36.0–46.0)
Hemoglobin: 13.5 g/dL (ref 12.0–15.0)
Immature Granulocytes: 1 %
Lymphocytes Relative: 18 %
Lymphs Abs: 1 10*3/uL (ref 0.7–4.0)
MCH: 28.5 pg (ref 26.0–34.0)
MCHC: 33.4 g/dL (ref 30.0–36.0)
MCV: 85.2 fL (ref 80.0–100.0)
Monocytes Absolute: 0.5 10*3/uL (ref 0.1–1.0)
Monocytes Relative: 10 %
Neutro Abs: 3.7 10*3/uL (ref 1.7–7.7)
Neutrophils Relative %: 69 %
Platelets: 254 10*3/uL (ref 150–400)
RBC: 4.74 MIL/uL (ref 3.87–5.11)
RDW: 14.6 % (ref 11.5–15.5)
WBC: 5.2 10*3/uL (ref 4.0–10.5)
nRBC: 0 % (ref 0.0–0.2)

## 2023-10-21 LAB — TROPONIN T, HIGH SENSITIVITY
Troponin T High Sensitivity: <15 ng/L (ref <19)
Troponin T High Sensitivity: <15 ng/L (ref <19)

## 2023-10-21 LAB — TSH: TSH: 1.64 u[IU]/mL (ref 0.350–4.500)

## 2023-10-21 LAB — CBG MONITORING, ED: Glucose-Capillary: 115 mg/dL — ABNORMAL HIGH (ref 70–99)

## 2023-10-21 MED ORDER — LACTATED RINGERS IV BOLUS
1000.0000 mL | Freq: Once | INTRAVENOUS | Status: AC
  Administered 2023-10-21: 1000 mL via INTRAVENOUS

## 2023-10-21 MED ORDER — LORAZEPAM 1 MG PO TABS
1.0000 mg | ORAL_TABLET | Freq: Once | ORAL | Status: DC
  Filled 2023-10-21: qty 1

## 2023-10-21 MED ORDER — MECLIZINE HCL 25 MG PO TABS
25.0000 mg | ORAL_TABLET | Freq: Once | ORAL | Status: AC
  Administered 2023-10-21: 25 mg via ORAL
  Filled 2023-10-21: qty 1

## 2023-10-21 NOTE — ED Provider Notes (Signed)
 [place] EMERGENCY DEPARTMENT AT [facility] Provider Note  CSN: [id number] Arrival date & time: 10/21/23 9562  Chief Complaint(s) Dizziness  HPI Anna Arnold is a 73 y.o. female who is here today for multiple complaints.  Patient reports that for "most of May" she has had intermittent episodes of lightheadedness, felt short of breath, intermittently had blurred vision and had a feeling of early satiety.  She saw her GI doctor on the ninth for her hiatal hernia.  Her pantoprazole  was increased.  She has seen her PCP on the 16th on the 23rd for the symptoms.  They have arranged for outpatient testing.  Patient is not here for worsening of symptoms, however persistence of symptoms.  She is concerned because she has a trip that she wants to go on on 20 June.   Past Medical History Past Medical History:  Diagnosis Date   Anxiety    Arthritis    Cataracts, bilateral    Cervical spondylosis    Degenerative disc disease, lumbar    Depression    Fibromyalgia    GERD (gastroesophageal reflux disease)    Glaucoma    Hypertension    Migraines    Osteopenia    Seasonal allergies    Sleep disorder    Patient Active Problem List   Diagnosis Date Noted   Other chronic pain 07/16/2022   Non-restorative sleep 07/16/2022   Anhedonia 07/16/2022   Insomnia secondary to chronic pain 07/16/2022   Interstitial lung disease (HCC) 07/16/2022   Arteriosclerosis of aorta (HCC) 07/16/2022   Arthralgia 01/26/2022   SOB (shortness of breath) on exertion 12/06/2021   Upper airway cough syndrome 12/06/2021   Allergic rhinitis 12/06/2021   Asthma 09/25/2019   ILD (interstitial lung disease) (HCC) 09/04/2019   Cervicalgia 02/13/2016   Chronic insomnia 02/13/2016   Chronic ethmoidal sinusitis 02/13/2016   Dizziness 11/25/2014   Hyperlipidemia 08/07/2007   ANXIETY DEPRESSION 08/07/2007   Unspecified glaucoma 08/07/2007   Essential hypertension 08/07/2007   Acute thromboembolism of deep  veins of lower extremity (HCC) 08/07/2007   ESOPHAGEAL STRICTURE 08/07/2007   DYSKINESIA OF ESOPHAGUS 08/07/2007   GERD 08/07/2007   Diaphragmatic hernia 08/07/2007   SPONDYLOSIS, CERVICAL 08/07/2007   FIBROMYALGIA 08/07/2007   Sleep apnea 08/07/2007   HEART MURMUR, BENIGN 08/07/2007   ANEMIA, IRON DEFICIENCY, HX OF 08/07/2007   Home Medication(s) Prior to Admission medications   Medication Sig Start Date End Date Taking? Authorizing Provider  ABRYSVO 120 MCG/0.5ML injection Inject 0.5 mLs into the muscle once. Patient not taking: Reported on 03/29/2023 12/10/22   [provider]  acetaminophen (TYLENOL) 500 MG tablet 2 tablets Orally once a day scheduled    [provider]  ACETAMINOPHEN PO Take 650 mg by mouth as needed.    [provider]  albuterol  (VENTOLIN  HFA) 108 (90 Base) MCG/ACT inhaler INHALE TWO PUFFS BY MOUTH EVERY 6 HOURS AS NEEDED FOR WHEEZING AND FOR SHORTNESS OF BREATH 11/13/22   Denson Flake, MD  alendronate (FOSAMAX) 70 MG tablet Take 70 mg by mouth once a week. Take with a full glass of water on an empty stomach.    [provider]  azelastine  (ASTELIN ) 0.1 % nasal spray Place 2 sprays into both nostrils 2 (two) times daily. Use in each nostril as directed 08/07/23   Cobb, Mariah Shines, NP  benzonatate  (TESSALON ) 200 MG capsule Take 200 mg by mouth as needed. 12/06/21   [provider]  buPROPion (WELLBUTRIN SR) 100 MG 12 hr  tablet Take 100 mg by mouth daily.    [provider]  Cholecalciferol (VITAMIN D3) 1000 units CAPS Take by mouth.    [provider]  Coenzyme Q10 (COQ10) 200 MG CAPS Take 200 mg by mouth 2 (two) times daily.    [provider]  COMIRNATY syringe Inject 0.3 mLs into the muscle once. Patient not taking: Reported on 03/29/2023 01/24/23   [provider]  Evolocumab  (REPATHA  SURECLICK) 140 MG/ML SOAJ Inject 140 mg into the skin every 14 (fourteen) days. 11/23/22   Wendie Hamburg, MD  fluticasone  (FLONASE ) 50 MCG/ACT nasal spray Place 1 spray into both nostrils daily. 08/07/23   Cobb, Mariah Shines, NP  Gluc-Chonn-MSM-Boswellia-Vit D (GLUCOSAMINE CHOND TRIPLE/VIT D PO) Take 1 tablet by mouth daily.    [provider]  losartan (COZAAR) 100 MG tablet Take 100 mg by mouth daily.    [provider]  meclizine  (ANTIVERT ) 25 MG tablet Take 25 mg by mouth as needed for dizziness.    [provider]  Multiple Minerals-Vitamins (CALCIUM -MAGNESIUM-ZINC-D3 PO) Take by mouth.    [provider]  Omega-3 Fatty Acids (ULTRA OMEGA-3 FISH OIL) 1400 MG CAPS     [provider]  pantoprazole  (PROTONIX ) 20 MG tablet Take 1 tablet (20 mg total) by mouth 2 (two) times daily. 10/04/23   Graciella Lavender, PA  SUMAtriptan (IMITREX) 100 MG tablet Take 100 mg by mouth every 2 (two) hours as needed for migraine. Take 1/2 tablet for migraine. May repeat in 2 hours if headache persists or recurs.    [provider]                                                                                                                                    Past Surgical History Past Surgical History:  Procedure Laterality Date   bunionectomy Right 2008   CATARACT EXTRACTION Bilateral 2015-2016   CESAREAN SECTION  1982   GLAUCOMA SURGERY Bilateral 2013   HAMMER TOE SURGERY Right 2008   ROTATOR CUFF REPAIR Right 2012   Family History Family History  Problem Relation Age of Onset   Cancer Mother    Depression Mother    Heart disease Father    Heart attack Brother    Depression Brother    Colon cancer Neg Hx    Colon polyps Neg Hx    Esophageal cancer Neg Hx    Rectal cancer Neg Hx    Stomach cancer Neg Hx     Social History Social History   Tobacco Use   Smoking status: Never    Passive exposure: Past   Smokeless tobacco: Never  Vaping Use   Vaping status: Never Used  Substance Use Topics   Alcohol use: No   Drug use: No    Allergies Brimonidine, Ceftin [cefuroxime axetil], Netarsudil, Netarsudil dimesylate, Viibryd [vilazodone hcl], Benzonatate , Amitriptyline, Atenolol, Augmentin [amoxicillin-pot clavulanate], Biaxin [clarithromycin], Brinzolamide,  Chlorthalidone, Citalopram hydrobromide, Erythromycin, Fluoxetine, Lexapro [escitalopram oxalate], Meloxicam, Potassium chloride, Reglan [metoclopramide], Sertraline hcl, Simvastatin, Singulair [montelukast sodium], Travoprost, and Zegerid [omeprazole]  Review of Systems Review of Systems  Physical Exam Vital Signs  I have reviewed the triage vital signs BP (!) 154/88   Pulse 78   Temp 98.2 F (36.8 C) (Oral)   Resp 14   Ht 4\' 11"  (1.499 m)   Wt 60.3 kg   SpO2 97%   BMI 26.86 kg/m   Physical Exam Vitals and nursing note reviewed.  Eyes:     Pupils: Pupils are equal, round, and reactive to light.  Neck:     Vascular: No carotid bruit.  Cardiovascular:     Rate and Rhythm: Normal rate.     Pulses: Normal pulses.  Abdominal:     General: Abdomen is flat. There is no distension.     Palpations: Abdomen is soft.     Tenderness: There is no abdominal tenderness. There is no guarding.  Musculoskeletal:        General: Normal range of motion.     Cervical back: Normal range of motion.  Skin:    General: Skin is warm.  Neurological:     General: No focal deficit present.     Mental Status: She is alert and oriented to person, place, and time.     Cranial Nerves: No cranial nerve deficit.     Motor: No weakness.     Gait: Gait normal.     ED Results and Treatments Labs (all labs ordered are listed, but only abnormal results are displayed) Labs Reviewed  COMPREHENSIVE METABOLIC PANEL WITH GFR - Abnormal; Notable for the following components:      Result Value   Glucose, Bld 104 (*)    Creatinine, Ser 1.04 (*)    GFR, Estimated 56 (*)    All other components within normal limits  URINALYSIS, W/ REFLEX TO CULTURE (INFECTION SUSPECTED) -  Abnormal; Notable for the following components:   Color, Urine COLORLESS (*)    Specific Gravity, Urine <1.005 (*)    Bacteria, UA RARE (*)    All other components within normal limits  CBG MONITORING, ED - Abnormal; Notable for the following components:   Glucose-Capillary 115 (*)    All other components within normal limits  CBC WITH DIFFERENTIAL/PLATELET  TSH  TROPONIN T, HIGH SENSITIVITY  TROPONIN T, HIGH SENSITIVITY                                                                                                                          Radiology DG Chest Port 1 View Result Date: 10/21/2023 CLINICAL DATA:  Cough EXAM: PORTABLE CHEST 1 VIEW COMPARISON:  Chest x-ray performed June 23, 2023 FINDINGS: Chronic interstitial changes. No discrete lobar infiltrate. No pleural effusion or pneumothorax. IMPRESSION: 1. No evidence of acute lobar infiltrate. 2. Underlying changes of chronic lung disease. Electronically Signed   By: Reagan Camera M.D.   On:  10/21/2023 10:24    Pertinent labs & imaging results that were available during my care of the patient were reviewed by me and considered in my medical decision making (see MDM for details).  Medications Ordered in ED Medications  LORazepam (ATIVAN) tablet 1 mg (1 mg Oral Not Given 10/21/23 1106)  meclizine  (ANTIVERT ) tablet 25 mg (25 mg Oral Given 10/21/23 1038)  lactated ringers  bolus 1,000 mL (0 mLs Intravenous Stopped 10/21/23 1145)                                                                                                                                     Procedures Procedures  (including critical care time)  Medical Decision Making / ED Course   This patient presents to the ED for concern of lightheadedness, shortness of breath, leg fullness at meals, this involves an extensive number of treatment options, and is a complaint that carries with it a high risk of complications and morbidity.    MDM: Taking each of the patient's  complaints individually.  First, the lightheadedness that she describes.  Sounds though she feels lightheaded when she stands.  Patient normotensive here in the ED, will check orthostatics on the patient.  She is ambulatory here in the ED.  No carotid bruit identified on auscultation.  No neurological deficits.  Does not appear to be vertiginous in nature.  We do not have CT imaging available today, otherwise I would obtain imaging to assess the patient's carotid arteries.  No ultrasound for this modality available in our ED.  Will try the patient with some symptomatic treatment with Ativan and meclizine  in the event that this is vertiginous, likely would not her.  Unclear how much they would help however.  Also provide the patient with fluids.  Regarding the patient's shortness of breath, chest x-ray, EKG and troponin ordered.  Patient with clear lung sounds, saturating 98% on room air.  Regarding the early fullness patient is experiencing.  She does have a hiatal hernia, I believe this is likely contributing to it.  She has no abdominal pain or tenderness on my exam.  Will check basic labs.  Again, no CT imaging available to me today as her CT scanner is broken.  Offered to have the patient be transferred to one of our other emergency departments for additional imaging or perform what workup we are able to here in the ED.  Patient preferred to stay here.  Reassessment 2:10 PM-patient's symptoms modestly improved.  She has been able to ambulate to and from the bathroom without any difficulty.  She has been eating and drinking in the ED.  Based on her reassuring workup and her blood work, I believe she is appropriate for continued outpatient follow-up.  Will discharge.  Additional history obtained: -Additional history obtained from husband at bedside -External records from outside source obtained and reviewed including: Chart review including previous notes, labs, imaging, consultation notes  Lab  Tests: -I ordered, reviewed, and interpreted labs.   The pertinent results include:   Labs Reviewed  COMPREHENSIVE METABOLIC PANEL WITH GFR - Abnormal; Notable for the following components:      Result Value   Glucose, Bld 104 (*)    Creatinine, Ser 1.04 (*)    GFR, Estimated 56 (*)    All other components within normal limits  URINALYSIS, W/ REFLEX TO CULTURE (INFECTION SUSPECTED) - Abnormal; Notable for the following components:   Color, Urine COLORLESS (*)    Specific Gravity, Urine <1.005 (*)    Bacteria, UA RARE (*)    All other components within normal limits  CBG MONITORING, ED - Abnormal; Notable for the following components:   Glucose-Capillary 115 (*)    All other components within normal limits  CBC WITH DIFFERENTIAL/PLATELET  TSH  TROPONIN T, HIGH SENSITIVITY  TROPONIN T, HIGH SENSITIVITY      EKG my independent review of the patient's EKG shows no ST segment depressions or elevations, no T wave inversions, no evidence of acute ischemia.  EKG Interpretation Date/Time:  Monday Oct 21 2023 09:38:39 EDT Ventricular Rate:  92 PR Interval:  189 QRS Duration:  95 QT Interval:  348 QTC Calculation: 431 R Axis:   153  Text Interpretation: Sinus rhythm Probable right ventricular hypertrophy Confirmed by Afton Horse [phone]) on 10/21/2023 1:32:05 PM         Imaging Studies ordered: I ordered imaging studies including chest x-ray I independently visualized and interpreted imaging. I agree with the radiologist interpretation   Medicines ordered and prescription drug management: Meds ordered this encounter  Medications   LORazepam (ATIVAN) tablet 1 mg   meclizine  (ANTIVERT ) tablet 25 mg   lactated ringers  bolus 1,000 mL    -I have reviewed the patients home medicines and have made adjustments as needed  Cardiac Monitoring: The patient was maintained on a cardiac monitor.  I personally viewed and interpreted the cardiac monitored which showed an underlying  rhythm of: Normal sinus rhythm  Social Determinants of Health:  Factors impacting patients care include: Lack of access to primary care   Reevaluation: After the interventions noted above, I reevaluated the patient and found that they have :improved  Co morbidities that complicate the patient evaluation  Past Medical History:  Diagnosis Date   Anxiety    Arthritis    Cataracts, bilateral    Cervical spondylosis    Degenerative disc disease, lumbar    Depression    Fibromyalgia    GERD (gastroesophageal reflux disease)    Glaucoma    Hypertension    Migraines    Osteopenia    Seasonal allergies    Sleep disorder       Dispostion: I considered admission for this patient, however with reassuring workup and improvement she is appropriate for outpatient follow-up.     Final Clinical Impression(s) / ED Diagnoses Final diagnoses:  Episodic lightheadedness  Early satiety     @PCDICTATION @    Afton Horse T, DO 10/21/23 1416

## 2023-10-21 NOTE — ED Triage Notes (Signed)
 In for eval of lightheadedness, SOB, blurred vision, and nausea. Onset around first week in May. Was seen by GI on 9th and PCP on 16th and 23rd.

## 2023-10-21 NOTE — ED Notes (Signed)
 Pt given discharge instructions and reviewed prescriptions. Opportunities given for questions. Pt verbalizes understanding. PIV removed x1. Jillyn Hidden, RN

## 2023-10-21 NOTE — Discharge Instructions (Addendum)
 While you were in the emergency room, you had blood work to look for signs of injury to your heart, problems with your electrolytes and blood cell counts.To your thyroid  function on a urine.  Your tests were all normal.  Your EKG was also normal.  At this time, I do not a clear cause for your symptoms.  I would recommend following up with your primary care doctor and there are handful of tests which they might consider.  One possible test would be to consider imaging of your carotid arteries to make sure that those areas are not narrow.  Another test to consider would be a brain MRI.  Please call your PCP this week to set up a follow-up appointment.

## 2023-10-22 ENCOUNTER — Other Ambulatory Visit (HOSPITAL_BASED_OUTPATIENT_CLINIC_OR_DEPARTMENT_OTHER): Payer: Self-pay | Admitting: Cardiology

## 2023-10-23 ENCOUNTER — Ambulatory Visit: Attending: General Practice | Admitting: General Practice

## 2023-10-23 ENCOUNTER — Encounter: Payer: Self-pay | Admitting: General Practice

## 2023-10-23 VITALS — BP 126/88 | HR 87 | Ht 59.0 in | Wt 133.6 lb

## 2023-10-23 DIAGNOSIS — R0609 Other forms of dyspnea: Secondary | ICD-10-CM | POA: Diagnosis not present

## 2023-10-23 DIAGNOSIS — R42 Dizziness and giddiness: Secondary | ICD-10-CM | POA: Diagnosis not present

## 2023-10-23 DIAGNOSIS — I10 Essential (primary) hypertension: Secondary | ICD-10-CM

## 2023-10-23 DIAGNOSIS — E785 Hyperlipidemia, unspecified: Secondary | ICD-10-CM | POA: Diagnosis not present

## 2023-10-23 NOTE — Patient Instructions (Signed)
 Medication Instructions:  Hold Protonix  for 1 week. May restart in 1 week if you don't notice changes after stopping it.  *If you need a refill on your cardiac medications before your next appointment, please call your pharmacy*  Lab Work: NONE ordered at this time of appointment    Testing/Procedures: Your physician has requested that you have an echocardiogram. Echocardiography is a painless test that uses sound waves to create images of your heart. It provides your doctor with information about the size and shape of your heart and how well your heart's chambers and valves are working. This procedure takes approximately one hour. There are no restrictions for this procedure. Please do NOT wear cologne, perfume, aftershave, or lotions (deodorant is allowed). Please arrive 15 minutes prior to your appointment time.  Please note: We ask at that you not bring children with you during ultrasound (echo/ vascular) testing. Due to room size and safety concerns, children are not allowed in the ultrasound rooms during exams. Our front office staff cannot provide observation of children in our lobby area while testing is being conducted. An adult accompanying a patient to their appointment will only be allowed in the ultrasound room at the discretion of the ultrasound technician under special circumstances. We apologize for any inconvenience.  Follow-Up: At [facility], you and your health needs are our priority.  As part of our continuing mission to provide you with exceptional heart care, our providers are all part of one team.  This team includes your primary Cardiologist (physician) and Advanced Practice Providers or APPs (Physician Assistants and Nurse Practitioners) who all work together to provide you with the care you need, when you need it.  Your next appointment:   2 month(s)  Provider:   Lawana Pray, NP          We recommend signing up for the patient portal called "MyChart".   Sign up information is provided on this After Visit Summary.  MyChart is used to connect with patients for Virtual Visits (Telemedicine).  Patients are able to view lab/test results, encounter notes, upcoming appointments, etc.  Non-urgent messages can be sent to your provider as well.   To learn more about what you can do with MyChart, go to [email or web address].

## 2023-10-23 NOTE — Progress Notes (Signed)
 Cardiology Clinic Note   Patient Name: Anna Arnold Date of Encounter: 10/23/2023  Primary Care Provider:  Mordechai April, DO Primary Cardiologist:  Wendie Hamburg, MD  Patient Profile    Anna Arnold 73 year old female presents the clinic today for follow-up evaluation of her lightheadedness and shortness of breath.  Past Medical History    Past Medical History:  Diagnosis Date   Anxiety    Arthritis    Cataracts, bilateral    Cervical spondylosis    Degenerative disc disease, lumbar    Depression    Fibromyalgia    GERD (gastroesophageal reflux disease)    Glaucoma    Hypertension    Migraines    Osteopenia    Seasonal allergies    Sleep disorder    Past Surgical History:  Procedure Laterality Date   bunionectomy Right 2008   CATARACT EXTRACTION Bilateral 2015-2016   CESAREAN SECTION  1982   GLAUCOMA SURGERY Bilateral 2013   HAMMER TOE SURGERY Right 2008   ROTATOR CUFF REPAIR Right 2012    Allergies  Allergies  Allergen Reactions   Brimonidine Other (See Comments)    swelling    Ceftin [Cefuroxime Axetil]     Diarrhea and vomiting   Netarsudil Other (See Comments)   Netarsudil Dimesylate Dermatitis, Itching, Swelling and Other (See Comments)    In eyes, itching     Viibryd [Vilazodone Hcl]     agitation   Benzonatate  Nausea Only   Amitriptyline     sedation   Atenolol     fatigue   Augmentin [Amoxicillin-Pot Clavulanate] Nausea Only    GI pain   Biaxin [Clarithromycin] Nausea Only    GI pain   Brinzolamide Anxiety   Chlorthalidone     cramping   Citalopram Hydrobromide Other (See Comments)    fatigue   Erythromycin Rash   Fluoxetine Other (See Comments)    agitation   Lexapro [Escitalopram Oxalate]     Agitation and nausea   Meloxicam Nausea Only    Nausea    Potassium Chloride     Abdominal pain   Reglan [Metoclopramide]     Abdominal pain   Sertraline Hcl Other (See Comments)    fatigue   Simvastatin     Joint  and muscle pain   Singulair [Montelukast Sodium]     Severe agitation, pruritis    Travoprost    Zegerid [Omeprazole] Nausea Only    nausea    History of Present Illness    Anna Arnold has a PMH of anxiety, arthritis, cervical spondylosis, lumbar degenerative disc disease, fibromyalgia, GERD, hypertension, seasonal allergies and sleep disorder.  Nuclear stress test 7/21 showed normal perfusion.  Echocardiogram 8/21 showed normal biventricular function and no significant valvular disease.  Cardiac event monitor for 14 days showed 10 beat run of NSVT and was otherwise not significant.  Coronary calcium  score 10/21 showed a score of 0.  An echocardiogram 6/23 showed normal EF and no significant valvular abnormalities.  She was seen and evaluated by Dr. Alda Amas on 03/29/2023.  She was doing well at that time.  She denied chest pain and shortness of breath.  She did note some lightheadedness with standing and denied syncope.  She report some swelling in her left leg.  She reported having palpitations and noted that her Fitbit had been recording higher heart rates.  She reported that 1-2 nights per week she was feeling palpitations at night that were lasting for short durations.  Cardiac  event monitor was ordered and showed no significant heart arrhythmia 04/23/2023.  She was seen and evaluated in the emergency department on 10/21/2023.  She had multiple complaints at that time.  She noted that most of May she had intermittent episodes of lightheadedness, shortness of breath, and intermittent blurred vision.  She also reported early satiety.  She saw her GI doctor who evaluated her hiatal hernia.  Her pantoprazole  was increased.  She saw her PCP on the 16th and 23rd for her symptoms.  Outpatient testing was arranged.  In the emergency department she noted that her symptoms were worsening and noted persistence of her symptoms.  She was concerned due to an upcoming trip on June 20.  Her chest x-ray showed  chronic lung disease with no acute findings.  Her CMP showed a creatinine of 1.04 with normal electrolytes.  Her TSH was normal.  Her CBC was unremarkable.  Her urinalysis was unremarkable.  Her cardiac troponins were low and flat.  She was not noted to have neurologic deficits.  No carotid bruits were noted on auscultation.  She was treated with Ativan and meclizine .  She received IV fluids.  It was felt that her early fullness could be attributed to her hiatal hernia.  She was not noted to have any abdominal tenderness on exam.  It was recommended that she have further follow-up at [facility] emergency department with head CT.  She preferred to stay at [facility].  She was reassessed around 2 PM.  Her symptoms had mostly improved.  She was able to ambulate to the bathroom without difficulty.  She was eating and drinking in the emergency department.  Due to her reassuring workup it was felt that she could be discharged and follow-up as an outpatient.  She presents to the clinic today for follow-up evaluation and states she continues to have shortness of breath, dizziness, and early satiety.  We reviewed her recent emergency department visit.  She and her husband expressed understanding.  She notes that her symptoms started at the beginning of May.  She is in the process of having an appointment with her neurologist for head CT.  We reviewed her previous cardiology visit, cardiac event monitor and echocardiogram.  She asked if she should have stress testing.  Due to her coronary calcium  score of 0 in 2021 and well-controlled cholesterol I will defer this at this time.  I will order echocardiogram and plan follow-up in 2 months.  Today she denies lower extremity edema, fatigue, palpitations, melena, hematuria, hemoptysis, diaphoresis, weakness, presyncope, syncope, orthopnea, and PND.     Home Medications    Prior to Admission medications   Medication Sig Start Date End Date Taking? Authorizing Provider   ABRYSVO 120 MCG/0.5ML injection Inject 0.5 mLs into the muscle once. Patient not taking: Reported on 03/29/2023 12/10/22   [provider]  acetaminophen (TYLENOL) 500 MG tablet 2 tablets Orally once a day scheduled    [provider]  ACETAMINOPHEN PO Take 650 mg by mouth as needed.    [provider]  albuterol  (VENTOLIN  HFA) 108 (90 Base) MCG/ACT inhaler INHALE TWO PUFFS BY MOUTH EVERY 6 HOURS AS NEEDED FOR WHEEZING AND FOR SHORTNESS OF BREATH 11/13/22   Denson Flake, MD  alendronate (FOSAMAX) 70 MG tablet Take 70 mg by mouth once a week. Take with a full glass of water on an empty stomach.    [provider]  azelastine  (ASTELIN ) 0.1 % nasal spray Place 2 sprays into both  nostrils 2 (two) times daily. Use in each nostril as directed 08/07/23   Cobb, Mariah Shines, NP  benzonatate  (TESSALON ) 200 MG capsule Take 200 mg by mouth as needed. 12/06/21   [provider]  buPROPion (WELLBUTRIN SR) 100 MG 12 hr tablet Take 100 mg by mouth daily.    [provider]  Cholecalciferol (VITAMIN D3) 1000 units CAPS Take by mouth.    [provider]  Coenzyme Q10 (COQ10) 200 MG CAPS Take 200 mg by mouth 2 (two) times daily.    [provider]  COMIRNATY syringe Inject 0.3 mLs into the muscle once. Patient not taking: Reported on 03/29/2023 01/24/23   [provider]  Evolocumab  (REPATHA  SURECLICK) 140 MG/ML SOAJ INJECT 140MG  UNDER THE SKIN EVERY 14 DAYS 10/22/23   Wendie Hamburg, MD  fluticasone  (FLONASE ) 50 MCG/ACT nasal spray Place 1 spray into both nostrils daily. 08/07/23   Cobb, Mariah Shines, NP  Gluc-Chonn-MSM-Boswellia-Vit D (GLUCOSAMINE CHOND TRIPLE/VIT D PO) Take 1 tablet by mouth daily.    [provider]  losartan (COZAAR) 100 MG tablet Take 100 mg by mouth daily.    [provider]  meclizine  (ANTIVERT ) 25 MG tablet Take 25 mg by mouth as needed for dizziness.    [provider]  Multiple  Minerals-Vitamins (CALCIUM -MAGNESIUM-ZINC-D3 PO) Take by mouth.    [provider]  Omega-3 Fatty Acids (ULTRA OMEGA-3 FISH OIL) 1400 MG CAPS     [provider]  pantoprazole  (PROTONIX ) 20 MG tablet Take 1 tablet (20 mg total) by mouth 2 (two) times daily. 10/04/23   Graciella Lavender, PA  SUMAtriptan (IMITREX) 100 MG tablet Take 100 mg by mouth every 2 (two) hours as needed for migraine. Take 1/2 tablet for migraine. May repeat in 2 hours if headache persists or recurs.    [provider]    Family History    Family History  Problem Relation Age of Onset   Cancer Mother    Depression Mother    Heart disease Father    Heart attack Brother    Depression Brother    Colon cancer Neg Hx    Colon polyps Neg Hx    Esophageal cancer Neg Hx    Rectal cancer Neg Hx    Stomach cancer Neg Hx    She indicated that the status of her mother is unknown. She indicated that the status of her father is unknown. She indicated that her brother is deceased. She indicated that the status of her neg hx is unknown.  Social History    Social History   Socioeconomic History   Marital status: Married    Spouse name: Not on file   Number of children: Not on file   Years of education: Not on file   Highest education level: Not on file  Occupational History   Occupation: retired  Tobacco Use   Smoking status: Never    Passive exposure: Past   Smokeless tobacco: Never  Vaping Use   Vaping status: Never Used  Substance and Sexual Activity   Alcohol use: No   Drug use: No   Sexual activity: Not on file  Other Topics Concern   Not on file  Social History Narrative   Not on file   Social Drivers of Health   Financial Resource Strain: Not on file  Food Insecurity: Not on file  Transportation Needs: Not on file  Physical Activity: Not on file  Stress: No Stress Concern Present (01/11/2021)  Received from [employer], [facility]   [employer] of  Occupational Health - Occupational Stress Questionnaire    Feeling of Stress : Not at all  Social Connections: Unknown (10/10/2021)   Received from [facility], [facility]   Social Network    Social Network: Not on file  Intimate Partner Violence: Unknown (09/01/2021)   Received from [facility], [facility]   HITS    Physically Hurt: Not on file    Insult or Talk Down To: Not on file    Threaten Physical Harm: Not on file    Scream or Curse: Not on file     Review of Systems    General:  No chills, fever, night sweats or weight changes.  Cardiovascular:  No chest pain, dyspnea on exertion, edema, orthopnea, palpitations, paroxysmal nocturnal dyspnea. Dermatological: No rash, lesions/masses Respiratory: No cough, dyspnea Urologic: No hematuria, dysuria Abdominal:   No nausea, vomiting, diarrhea, bright red blood per rectum, melena, or hematemesis Neurologic:  No visual changes, wkns, changes in mental status. All other systems reviewed and are otherwise negative except as noted above.  Physical Exam    VS:  BP 126/88 (BP Location: Left Arm, Patient Position: Sitting, Cuff Size: Normal)   Pulse 87   Ht 4\' 11"  (1.499 m)   Wt 133 lb 9.6 oz (60.6 kg)   SpO2 96%   BMI 26.98 kg/m  , BMI Body mass index is 26.98 kg/m. GEN: Well nourished, well developed, in no acute distress. HEENT: normal. Neck: Supple, no JVD, carotid bruits, or masses. Cardiac: RRR, no murmurs, rubs, or gallops. No clubbing, cyanosis, edema.  Radials/DP/PT 2+ and equal bilaterally.  Respiratory:  Respirations regular and unlabored, clear to auscultation bilaterally. GI: Soft, nontender, nondistended, BS + x 4. MS: no deformity or atrophy. Skin: warm and dry, no rash. Neuro:  Strength and sensation are intact. Psych: Normal affect.  Accessory Clinical Findings    Recent Labs: 10/21/2023: ALT 14; BUN 17; Creatinine, Ser 1.04; Hemoglobin 13.5; Platelets 254; Potassium 4.1; Sodium 137; TSH 1.640    Recent Lipid Panel    Component Value Date/Time   CHOL 174 01/18/2023 0826   TRIG 91 01/18/2023 0826   HDL 71 01/18/2023 0826   CHOLHDL 2.5 01/18/2023 0826   LDLCALC 86 01/18/2023 0826         ECG personally reviewed by me today-none today.    Echocardiogram 11/15/2021  IMPRESSIONS     1. Left ventricular ejection fraction, by estimation, is 60 to 65%. The  left ventricle has normal function. The left ventricle has no regional  wall motion abnormalities. Left ventricular diastolic parameters were  normal. The average left ventricular  global longitudinal strain is -24.7 %. The global longitudinal strain is  normal.   2. Right ventricular systolic function is normal. The right ventricular  size is normal. Tricuspid regurgitation signal is inadequate for assessing  PA pressure.   3. The mitral valve is normal in structure. No evidence of mitral valve  regurgitation. No evidence of mitral stenosis.   4. The aortic valve is tricuspid. Aortic valve regurgitation is not  visualized. No aortic stenosis is present.   5. The inferior vena cava is normal in size with greater than 50%  respiratory variability, suggesting right atrial pressure of 3 mmHg.   Comparison(s): No significant change from prior study.   Conclusion(s)/Recommendation(s): Normal biventricular function without  evidence of hemodynamically significant valvular heart disease.   FINDINGS   Left Ventricle: Left ventricular ejection  fraction, by estimation, is 60  to 65%. The left ventricle has normal function. The left ventricle has no  regional wall motion abnormalities. The average left ventricular global  longitudinal strain is -24.7 %.  The global longitudinal strain is normal. 3D left ventricular ejection  fraction analysis performed but not reported based on interpreter  judgement due to suboptimal tracking. The left ventricular internal cavity  size was normal in size. There is no left  ventricular  hypertrophy. Left ventricular diastolic parameters were  normal.   Right Ventricle: The right ventricular size is normal. No increase in  right ventricular wall thickness. Right ventricular systolic function is  normal. Tricuspid regurgitation signal is inadequate for assessing PA  pressure. The tricuspid regurgitant  velocity is 2.25 m/s, and with an assumed right atrial pressure of 3 mmHg,  the estimated right ventricular systolic pressure is 23.2 mmHg.   Left Atrium: Left atrial size was normal in size.   Right Atrium: Right atrial size was normal in size.   Pericardium: There is no evidence of pericardial effusion.   Mitral Valve: The mitral valve is normal in structure. No evidence of  mitral valve regurgitation. No evidence of mitral valve stenosis.   Tricuspid Valve: The tricuspid valve is normal in structure. Tricuspid  valve regurgitation is trivial. No evidence of tricuspid stenosis.   Aortic Valve: The aortic valve is tricuspid. Aortic valve regurgitation is  not visualized. No aortic stenosis is present.   Pulmonic Valve: The pulmonic valve was normal in structure. Pulmonic valve  regurgitation is not visualized.   Aorta: The aortic root, ascending aorta, aortic arch and descending aorta  are all structurally normal, with no evidence of dilitation or  obstruction.   Venous: The inferior vena cava is normal in size with greater than 50%  respiratory variability, suggesting right atrial pressure of 3 mmHg.   IAS/Shunts: The atrial septum is grossly normal.    Cardiac event monitor 04/23/2023    2 episodes of SVT, longest lasting 9 beats   No significant arrhythmias     Patch Wear Time:  13 days and 23 hours (2024-11-04T16:17:34-0500 to 2024-11-18T15:31:15-0500)   Patient had a min HR of 49 bpm, max HR of 148 bpm, and avg HR of 80 bpm. Predominant underlying rhythm was Sinus Rhythm. 2 Supraventricular Tachycardia runs occurred, the run with the fastest interval  lasting 6 beats with a max rate of 148 bpm, the  longest lasting 9 beats with an avg rate of 111 bpm. Isolated SVEs were rare (<1.0%), and no SVE Couplets or SVE Triplets were present. Isolated VEs were rare (<1.0%), VE Couplets were rare (<1.0%), and no VE Triplets were present. Ventricular Trigeminy  was present.    Assessment & Plan   1.  Dizziness-heart rate today 87 bpm.  Denies further episodes of lightheadedness presyncope or syncope since being in the emergency department.  Symptoms do not appear to be cardiac related.  On exam she was noted to have poor eye tracking downward.  No nystagmus noted. Maintain p.o. hydration Change positions slowly Follow-up with neurology and CT scan  Shortness of breath-Notes increased work of breathing with any activity.  Has been present for several weeks.  Echocardiogram 11/15/2021 showed normal LVEF and no significant valvular abnormalities. Increase physical activity as tolerated Heart healthy low-sodium diet Order echocardiogram  Essential hypertension-BP today 126/88. Maintain blood pressure log Low-sodium diet Continue losartan  Hyperlipidemia-LDL 86 on 01/18/2023. Continue Repatha , co-Q10, omega-3 fatty acids High-fiber diet  Disposition: Follow-up  with Dr. Alda Amas or me in 2 months.   Chet Cota. Maitland Lesiak NP-C     10/23/2023, 3:43 PM [facility] [address] Office [phone] Fax [phone]    I spent14 minutes examining this patient, reviewing medications, and using patient centered shared decision making involving their cardiac care.   I spent  20 minutes reviewing past medical history,  medications, and prior cardiac tests.

## 2023-10-24 ENCOUNTER — Ambulatory Visit (HOSPITAL_COMMUNITY)
Admission: RE | Admit: 2023-10-24 | Discharge: 2023-10-24 | Disposition: A | Discharge status: Home / Self Care | Source: Ambulatory Visit | Attending: Cardiology | Admitting: Cardiology

## 2023-10-24 ENCOUNTER — Ambulatory Visit: Payer: Self-pay | Admitting: General Practice

## 2023-10-24 DIAGNOSIS — R42 Dizziness and giddiness: Secondary | ICD-10-CM

## 2023-10-24 DIAGNOSIS — R0609 Other forms of dyspnea: Secondary | ICD-10-CM

## 2023-10-24 LAB — ECHOCARDIOGRAM COMPLETE
Area-P 1/2: 3.79 cm2
S' Lateral: 2.6 cm

## 2023-10-30 DIAGNOSIS — R251 Tremor, unspecified: Secondary | ICD-10-CM | POA: Diagnosis not present

## 2023-10-31 ENCOUNTER — Ambulatory Visit (INDEPENDENT_AMBULATORY_CARE_PROVIDER_SITE_OTHER): Admitting: Otolaryngology

## 2023-10-31 ENCOUNTER — Encounter (INDEPENDENT_AMBULATORY_CARE_PROVIDER_SITE_OTHER): Payer: Self-pay | Admitting: Otolaryngology

## 2023-10-31 VITALS — BP 145/84 | HR 86 | Ht 59.0 in | Wt 131.0 lb

## 2023-10-31 DIAGNOSIS — J342 Deviated nasal septum: Secondary | ICD-10-CM | POA: Diagnosis not present

## 2023-10-31 DIAGNOSIS — H6123 Impacted cerumen, bilateral: Secondary | ICD-10-CM

## 2023-10-31 DIAGNOSIS — R42 Dizziness and giddiness: Secondary | ICD-10-CM | POA: Diagnosis not present

## 2023-10-31 DIAGNOSIS — R0981 Nasal congestion: Secondary | ICD-10-CM | POA: Diagnosis not present

## 2023-10-31 DIAGNOSIS — J358 Other chronic diseases of tonsils and adenoids: Secondary | ICD-10-CM

## 2023-10-31 DIAGNOSIS — H6983 Other specified disorders of Eustachian tube, bilateral: Secondary | ICD-10-CM

## 2023-10-31 DIAGNOSIS — J310 Chronic rhinitis: Secondary | ICD-10-CM

## 2023-10-31 NOTE — Progress Notes (Signed)
 Follow-up: Recurrent rhinosinusitis, eustachian tube dysfunction New complaint: Recurrent dizziness  The patient is a 73 year old female who presents today complaining of recurrent dizziness. The patient has a history of recurrent rhinosinusitis, eustachian tube dysfunction, and cerumen impaction. The patient was last seen in 2022.[name] At that time, she was noted to have bilateral eustachian tube dysfunction, chronic rhinitis and nasal mucosal congestion. The patient was placed on Flonase  nasal spray and encouraged to perform the Valsalva exercise daily. However, she has difficulty using the Flonase  due to her glaucoma. The patient has recently noted recurrent dizziness.  She describes her dizziness as a lightheaded and nauseous sensation.  She has occasional blurry vision.  No spinning vertigo is noted. No associated aural pressure, hearing loss, or tinnitus is noted.  She was recently evaluated by a cardiologist.  Her echocardiogram was negative.  She is scheduled to see a neurologist.  She continues to have occasional nasal congestion.  Exam: General: Communicates without difficulty, well nourished, no acute distress. Head: Normocephalic, no evidence injury, no tenderness, facial buttresses intact without stepoff. Eyes: PERRL, EOMI. No scleral icterus, conjunctivae clear. Neuro: CN II exam reveals vision grossly intact. No nystagmus at any point of gaze. Auricles: Intact without lesions. Bilateral cerumen impaction. Nose: External evaluation reveals normal support and skin without lesions. Dorsum is intact. Anterior rhinoscopy reveals congested mucosa over anterior aspect of inferior turbinates and deviated septum. No purulence noted. Oral:  Oral cavity and oropharynx are intact, symmetric, without erythema or edema. Mucosa is moist. Incidental finding of a small mucous retention cyst at the right superior tonsillar fossa. Neck: Full range of motion without pain. There is no significant lymphadenopathy. No  masses palpable. Thyroid  bed within normal limits to palpation. Parotid glands and submandibular glands equal bilaterally without mass. Trachea is midline. Neuro:  CN 2-12 grossly intact. Gait normal. Vestibular: No nystagmus at any point of gaze. The cerebellar examination is unremarkable. Vestibular: No nystagmus at any point of gaze. Dix Hallpike negative. There is no nystagmus with pneumatic pressure on either tympanic membrane or Valsalva. The cerebellar examination is unremarkable.    Procedure: Bilateral cerumen disimpaction.  Anesthesia: None.  Description: Under the operating microscope, the cerumen is carefully removed with a combination of cerumen currette, alligator forceps, and suction catheters. After the cerumen is removed, the TMs are noted to be normal. No mass, erythema, or lesions. The patient tolerated the procedure well.   Assessment: 1.  Bilateral cerumen impaction. The patient's ear canals, tympanic membranes and middle ear spaces are otherwise normal.  2.  Recurrent dizziness of unknown etiology. The possible differential diagnoses include transient BPPV, vestibular migraine, Meniere's disease, peripheral vestibular dysfunction, or other central/systemic causes.   3.  Bilateral eustachian tube dysfunction.  4.  Chronic rhinitis with nasal mucosal congestion and nasal septal deviation.  5.  Stable right tonsillar mucus retention cyst.   Plan: 1. The physical exam findings are reviewed with the patient.  2. Otomicroscopy with cerumen disimpaction.  3. The patient is encouraged to perform the Valsalva exercise daily.  4. The pathophysiology of vestibular dysfunction and dizziness are discussed extensively with the patient. The possible differential diagnoses are reviewed. Questions are invited and answered.   5.  If her dizziness persists, she may benefit from undergoing physical therapy/vestibular rehabilitation to improve the balancing function.

## 2023-11-03 DIAGNOSIS — J310 Chronic rhinitis: Secondary | ICD-10-CM | POA: Insufficient documentation

## 2023-11-03 DIAGNOSIS — H6123 Impacted cerumen, bilateral: Secondary | ICD-10-CM | POA: Insufficient documentation

## 2023-11-03 DIAGNOSIS — J342 Deviated nasal septum: Secondary | ICD-10-CM | POA: Insufficient documentation

## 2023-11-03 DIAGNOSIS — H6983 Other specified disorders of Eustachian tube, bilateral: Secondary | ICD-10-CM | POA: Insufficient documentation

## 2023-11-04 ENCOUNTER — Telehealth: Payer: Self-pay | Admitting: Neurology

## 2023-11-04 ENCOUNTER — Ambulatory Visit: Admitting: Neurology

## 2023-11-04 VITALS — BP 160/87 | HR 78 | Ht 59.0 in | Wt 134.0 lb

## 2023-11-04 DIAGNOSIS — S0990XA Unspecified injury of head, initial encounter: Secondary | ICD-10-CM | POA: Insufficient documentation

## 2023-11-04 DIAGNOSIS — R790 Abnormal level of blood mineral: Secondary | ICD-10-CM

## 2023-11-04 DIAGNOSIS — G4733 Obstructive sleep apnea (adult) (pediatric): Secondary | ICD-10-CM | POA: Diagnosis not present

## 2023-11-04 DIAGNOSIS — R42 Dizziness and giddiness: Secondary | ICD-10-CM | POA: Diagnosis not present

## 2023-11-04 DIAGNOSIS — H539 Unspecified visual disturbance: Secondary | ICD-10-CM

## 2023-11-04 DIAGNOSIS — H814 Vertigo of central origin: Secondary | ICD-10-CM

## 2023-11-04 MED ORDER — MECLIZINE HCL 25 MG PO TABS: 25.0000 mg | ORAL_TABLET | Freq: Two times a day (BID) | ORAL | 0 refills | Status: AC | PRN

## 2023-11-04 NOTE — Patient Instructions (Signed)
 Dizziness Dizziness is a common problem. It makes you feel unsteady or light-headed. You may feel like you're about to faint. Dizziness can lead to getting hurt if you stumble or fall. It's more common to feel dizzy if you're an older adult. Many things can cause you to feel dizzy. These include: Medicines. Dehydration. This is when there's not enough water in your body. Illness. Follow these instructions at home: Eating and drinking  Drink enough fluid to keep your pee (urine) pale yellow. This helps keep you from getting dehydrated. Try to drink more clear fluids, such as water. Do not drink alcohol. Try to limit how much caffeine you take in. Try to limit how much salt, also called sodium, you take in. Activity Try not to make quick movements. Stand up slowly from sitting in a chair. Steady yourself until you feel okay. In the morning, first sit up on the side of the bed. When you feel okay, hold onto something and slowly stand up. Do this until you know that your balance is okay. If you need to stand in one place for a long time, move your legs often. Tighten and relax the muscles in your legs while you're standing. Do not drive or use machines if you feel dizzy. Avoid bending down if you feel dizzy. Place items in your home so you can reach them without leaning over. Lifestyle Do not smoke, vape, or use products with nicotine or tobacco in them. If you need help quitting, talk with your health care provider. Try to lower your stress level. You can do this by using methods like yoga or meditation. Talk with your provider if you need help. General instructions Watch your dizziness for any changes. Take your medicines only as told by your provider. Talk with your provider if you think you're dizzy because of a medicine you're taking. Tell a friend or a family member that you're feeling dizzy. If they spot any changes in your behavior, have them call your provider. Contact a health care  provider if: Your dizziness doesn't go away, or you have new symptoms. Your dizziness gets worse. You feel like you may vomit. You have trouble hearing. You have a fever. You have neck pain or a stiff neck. You fall or get hurt. Get help right away if: You vomit each time you eat or drink. You have watery poop and can't eat or drink. You have trouble talking, walking, swallowing, or using your arms, hands, or legs. You feel very weak. You're bleeding. You're not thinking clearly, or you have trouble forming sentences. A friend or family member may spot this. Your vision changes, or you get a very bad headache. These symptoms may be an emergency. Call 911 right away. Do not wait to see if the symptoms will go away. Do not drive yourself to the hospital. This information is not intended to replace advice given to you by your health care provider. Make sure you discuss any questions you have with your health care provider. Document Revised: 02/14/2023 Document Reviewed: 06/28/2022 Elsevier Patient Education  2024 [employer].

## 2023-11-04 NOTE — Progress Notes (Addendum)
 [name]     [facility]    Provider:  Neomia Banner, MD  Primary Care Physician:  Mordechai April, DO [address] [place] [address]     Referring Provider: Mordechai April, Do [address] [place],  [place] [address]          Chief Complaint according to patient   Patient presents with:                HISTORY OF PRESENT ILLNESS:  Anna Arnold is a 73 y.o. female patient who is here for revisit 11/04/2023 for  bouts of dizziness , SOB with known interstitial lung disease. already worked up by ENT ( no impactment) ,  No positional vertigo reported nor checked.  Lightheadedness when first getting up in the morning,  feels nauseated and is very hungry in AM, clammy at times, palpitations .  PCP evaluated with a battery of labs and did orthostatics,  normal.  She did have a variation of 10 mmHg,  She has seen pulmonology and cardiology.   I see no neurologic dysfunction on exam- Concerned about her low ferritin, in most cases we like to see 30 and higher , hers was below 7.  I believe this can contribute to fatigue and lightheadedness.  I one time IV iron infusion may guve her the boost that she then can maintain with I oral supplements.  She was encouraged to keep well hydrated.     Chief concern according to patient :   Lightheadedness when first getting up in the morning,  feels nauseated and is very hungry in AM, clammy at times, palpitations . Blurred vision.  Her OSA was too mild to explain any of this she had glaucoma. 2022, cervical anterior fusion in 2022.    Fam Hx: Father had AD - dementia , had CHF, died of MI 13.  Brother died of MI/ CAD- age 57      Review of Systems: Out of a complete 14 system review, the patient complains of only the following symptoms, and all other reviewed systems are negative.:  Anna Arnold is a 73 y.o. female who is here today for multiple complaints.  Patient reports that for most of May she has had  intermittent episodes of lightheadedness, felt short of breath, intermittently had blurred vision and had a feeling of early satiety.  She saw her GI doctor on the ninth for her hiatal hernia.  Her pantoprazole  was increased.  She has seen her PCP on the 16th on the 23rd for the symptoms.  They have arranged for outpatient testing.  Patient is not here for worsening of symptoms, however persistence of symptoms.  She is concerned because she has a trip that she wants to go on on 20 June.   She hit her head hard recently while cleaning her home and this - she feels- has also contributed to blurred vision.    GDS :  2  FSS;  24  ESS: 2  Social History   Socioeconomic History   Marital status: Married    Spouse name: Not on file   Number of children: Not on file   Years of education: Not on file   Highest education level: Not on file  Occupational History   Occupation: retired  Tobacco Use   Smoking status: Never    Passive exposure: Past   Smokeless tobacco: Never  Vaping Use   Vaping status: Never Used  Substance and Sexual  Activity   Alcohol use: No   Drug use: No   Sexual activity: Not on file  Other Topics Concern   Not on file  Social History Narrative   Not on file   Social Drivers of Health   Financial Resource Strain: Not on file  Food Insecurity: Not on file  Transportation Needs: Not on file  Physical Activity: Not on file  Stress: No Stress Concern Present (01/11/2021)   Received from [facility] - Occupational Stress Questionnaire    Feeling of Stress : Not at all  Social Connections: Unknown (10/10/2021)   Received from [facility]   Social Network    Social Network: Not on file    Family History  Problem Relation Age of Onset   Cancer Mother    Depression Mother    Heart disease Father    Heart attack Brother    Depression Brother    Colon cancer Neg Hx    Colon polyps Neg  Hx    Esophageal cancer Neg Hx    Rectal cancer Neg Hx    Stomach cancer Neg Hx     Past Medical History:  Diagnosis Date   Anxiety    Arthritis    Cataracts, bilateral    Cervical spondylosis    Degenerative disc disease, lumbar    Depression    Fibromyalgia    GERD (gastroesophageal reflux disease)    Glaucoma    Hypertension    Migraines    Osteopenia    Seasonal allergies    Sleep disorder     Past Surgical History:  Procedure Laterality Date   bunionectomy Right 2008   CATARACT EXTRACTION Bilateral 2015-2016   CESAREAN SECTION  1982   GLAUCOMA SURGERY Bilateral 2013   HAMMER TOE SURGERY Right 2008   ROTATOR CUFF REPAIR Right 2012     Current Outpatient Medications on File Prior to Visit  Medication Sig Dispense Refill   ACETAMINOPHEN PO Take 650 mg by mouth as needed.     albuterol  (VENTOLIN  HFA) 108 (90 Base) MCG/ACT inhaler INHALE TWO PUFFS BY MOUTH EVERY 6 HOURS AS NEEDED FOR WHEEZING AND FOR SHORTNESS OF BREATH 18 g 5   alendronate (FOSAMAX) 70 MG tablet Take 70 mg by mouth once a week. Take with a full glass of water on an empty stomach.     azelastine  (ASTELIN ) 0.1 % nasal spray Place 2 sprays into both nostrils 2 (two) times daily. Use in each nostril as directed 30 mL 5   buPROPion (WELLBUTRIN SR) 100 MG 12 hr tablet Take 100 mg by mouth daily.     Cholecalciferol (VITAMIN D3) 1000 units CAPS Take by mouth.     Coenzyme Q10 (COQ10) 200 MG CAPS Take 200 mg by mouth 2 (two) times daily.     Evolocumab  (REPATHA  SURECLICK) 140 MG/ML SOAJ INJECT 140MG  UNDER THE SKIN EVERY 14 DAYS 6 mL 3   Ferrous Sulfate (IRON) 325 (65 Fe) MG TABS Take 325 mg by mouth daily.     fluticasone  (FLONASE ) 50 MCG/ACT nasal spray Place 1 spray into both nostrils daily. 18.2 mL 2   Gluc-Chonn-MSM-Boswellia-Vit D (GLUCOSAMINE CHOND TRIPLE/VIT D PO) Take 1 tablet by mouth daily.     losartan (COZAAR) 100 MG tablet Take 100 mg by mouth daily.     Multiple Minerals-Vitamins  (CALCIUM -MAGNESIUM-ZINC-D3 PO) Take by mouth.     Omega-3 Fatty Acids (ULTRA OMEGA-3 FISH OIL) 1400 MG  CAPS      pantoprazole  (PROTONIX ) 20 MG tablet Take 1 tablet (20 mg total) by mouth 2 (two) times daily. (Patient taking differently: Take 20 mg by mouth 2 (two) times daily. Hold Protonix  for 1 week. May restart in 1 week if you don't notice changes after stopping it.) 60 tablet 5   SUMAtriptan (IMITREX) 100 MG tablet Take 100 mg by mouth every 2 (two) hours as needed for migraine. Take 1/2 tablet for migraine. May repeat in 2 hours if headache persists or recurs.     No current facility-administered medications on file prior to visit.    Allergies  Allergen Reactions   Brimonidine Other (See Comments)    swelling    Ceftin [Cefuroxime Axetil]     Diarrhea and vomiting   Netarsudil Other (See Comments)   Netarsudil Dimesylate Dermatitis, Itching, Swelling and Other (See Comments)    In eyes, itching     Viibryd [Vilazodone Hcl]     agitation   Benzonatate  Nausea Only   Amitriptyline     sedation   Atenolol     fatigue   Augmentin [Amoxicillin-Pot Clavulanate] Nausea Only    GI pain   Biaxin [Clarithromycin] Nausea Only    GI pain   Brinzolamide Anxiety   Chlorthalidone     cramping   Citalopram Hydrobromide Other (See Comments)    fatigue   Erythromycin Rash   Fluoxetine Other (See Comments)    agitation   Lexapro [Escitalopram Oxalate]     Agitation and nausea   Meloxicam Nausea Only    Nausea    Potassium Chloride     Abdominal pain   Reglan [Metoclopramide]     Abdominal pain   Sertraline Hcl Other (See Comments)    fatigue   Simvastatin     Joint and muscle pain   Singulair [Montelukast Sodium]     Severe agitation, pruritis    Travoprost    Zegerid [Omeprazole] Nausea Only    nausea     DIAGNOSTIC DATA (LABS, IMAGING, TESTING) - I reviewed patient records, labs, notes, testing and imaging myself where available.  Lab Results  Component Value Date    WBC 5.2 10/21/2023   HGB 13.5 10/21/2023   HCT 40.4 10/21/2023   MCV 85.2 10/21/2023   PLT 254 10/21/2023      Component Value Date/Time   NA 137 10/21/2023 1018   K 4.1 10/21/2023 1018   CL 99 10/21/2023 1018   CO2 23 10/21/2023 1018   GLUCOSE 104 (H) 10/21/2023 1018   BUN 17 10/21/2023 1018   CREATININE 1.04 (H) 10/21/2023 1018   CALCIUM  10.0 10/21/2023 1018   PROT 6.9 10/21/2023 1018   ALBUMIN 4.4 10/21/2023 1018   AST 26 10/21/2023 1018   ALT 14 10/21/2023 1018   ALKPHOS 67 10/21/2023 1018   BILITOT 0.3 10/21/2023 1018   GFRNONAA 56 (L) 10/21/2023 1018   GFRAA >60 07/06/2019 1057   Lab Results  Component Value Date   CHOL 174 01/18/2023   HDL 71 01/18/2023   LDLCALC 86 01/18/2023   TRIG 91 01/18/2023   CHOLHDL 2.5 01/18/2023   No results found for: HGBA1C Lab Results  Component Value Date   VITAMINB12 460 05/04/2008   Lab Results  Component Value Date   TSH 1.640 10/21/2023    PHYSICAL EXAM:  Today's Vitals   11/04/23 0821  BP: (!) 160/87  Pulse: 78  Weight: 134 lb (60.8 kg)  Height: 4' 11 (1.499 m)  Body mass index is 27.06 kg/m.   Wt Readings from Last 3 Encounters:  11/04/23 134 lb (60.8 kg)  10/31/23 131 lb (59.4 kg)  10/23/23 133 lb 9.6 oz (60.6 kg)     Ht Readings from Last 3 Encounters:  11/04/23 4' 11 (1.499 m)  10/31/23 4' 11 (1.499 m)  10/23/23 4' 11 (1.499 m)      General: The patient is awake, alert and appears not in acute distress. The patient is well groomed. Head: Normocephalic, atraumatic. Neck is supple. Mallampati 2,  neck circumference:14 inches . Nasal airflow  patent.   Overbite Myrle Aspen is not seen.  Dental status:  Cardiovascular:  Regular rate and cardiac rhythm by pulse,  without distended neck veins. Respiratory: Lungs are clear to auscultation.  Skin:  Without evidence of ankle edema, or rash. Trunk: The patient's posture is erect.   NEUROLOGIC EXAM: The patient is awake and alert, oriented to  place and time.   Memory subjective described as intact.  Attention span & concentration ability appears normal.  Speech is fluent,  without  dysarthria, dysphonia or aphasia.  Mood and affect are appropriate.   Cranial nerves: no loss of smell or taste reported  Pupils are equal and briskly reactive to light. Funduscopic exam deferred.  Extraocular movements in vertical and horizontal planes were intact and without nystagmus.  No Diplopia. Visual fields by finger perimetry are intact. Hearing was intact to soft voice and finger rubbing.    Facial sensation intact to fine touch.  Facial motor strength is symmetric and tongue and uvula move midline.  Neck ROM : rotation, tilt and flexion extension were normal for age and shoulder shrug was symmetrical.    Motor exam:  Symmetric bulk, tone and ROM.   Normal tone without cog wheeling, symmetric grip strength .   Sensory:  Fine touch and vibration intact on both feet and ankles  Proprioception tested in the upper extremities was normal.   Coordination: Rapid alternating movements in the fingers/hands were of normal speed.  The Finger-to-nose maneuver was intact without evidence of ataxia, dysmetria or tremor.   Gait and station: Patient could rise unassisted from a seated position, walked without assistive device. She rose in one fluid movement from a seated position without bracing.   Stance is of normal width/ base and the patient turned with 4 steps.  Toe and heel walk were deferred.  Deep tendon reflexes: in the  upper  ( brisk) and lower extremities are symmetric and intact.  Babinski response was deferred .    ASSESSMENT AND PLAN 73 y.o. year old female  here with:    First, the lightheadedness that she describes.  S ounds though she feels lightheaded when she stands.  Patient normotensive here again - PCP had just documented  normal BP and HR , orthostatics,   She is ambulatory , No carotid bruit I.No neurological deficits.    Does not appear to be vertiginous in nature.     Regarding the patient's shortness of breath, chest x-ray, EKG and troponin had been ordered and returned normal .  Patient with clear lung sounds, saturating 98% on room air.   Regarding the early fullness patient is experiencing.  She does have a hiatal hernia, I believe this is likely contributing to it.  She has no abdominal pain or tenderness   Offered to have the patient get MRI brain and MRA brain and neck.    1)  dizziness that is not vestibular, not  orthostatic and not related to tinnitus. She did report hitting her left temple, severely enough that she felt  foggy or cloudy  and had more blurred vision, this may qualify as TBI- trauma . MRI ordered.  2) I belove we can get her better with iron supplementation.  She has low ferritin, is not anemic however.  RLS  not present in years, was treated with gabapentin  3) No neuropathy is present.  Cervical pain still present, left hand numbness is till present.  4) her last HST showed an AHI of 8/h , very mild apnea.  I agree with her that this is not worthy for struggling with CPAP.    I offered meclizine  for nausea when she is going to cruise this summer.  I encouraged the iron supplement in combination with Vitamin C.  I want to repeat an MRI brain to rule out a CVA , mini stroke. - any location that can serve the patient beofre her vacation starts will be fine. I added MRA neck and head - non contrast .     Besides that, I have no further neurological causes or advise.     I would like to thank  Mordechai April, Do [address] [place],  [place] [address] for allowing me to meet with and to take care of this pleasant patient.    After spending a total time of  37 minutes face to face and additional time for physical and neurologic examination, review of laboratory studies,  personal review of imaging studies, reports and results of other testing and review of referral  information / records as far as provided in visit,   Electronically signed by: Neomia Banner, MD 11/04/2023 8:35 AM  [facility] certified by The [employer] of Sleep Medicine and Diplomate of the [employer] of [employer]. Board certified In Neurology through the ABPN, Fellow of the [employer] of [employer].

## 2023-11-04 NOTE — Addendum Note (Signed)
 Addended by: Neomia Banner on: 11/04/2023 12:41 PM   Modules accepted: Orders

## 2023-11-04 NOTE — Telephone Encounter (Signed)
 sent to GI they obtain [facility] Berkley Harvey [phone]

## 2023-11-05 ENCOUNTER — Encounter: Payer: Self-pay | Admitting: Neurology

## 2023-11-06 ENCOUNTER — Ambulatory Visit: Admitting: Physician Assistant

## 2023-11-06 ENCOUNTER — Encounter: Payer: Self-pay | Admitting: Neurology

## 2023-11-06 NOTE — Telephone Encounter (Signed)
 I know about the location change for the MRIs and I will call her insurance in the morning to change the facility on her authorizations, I wasn't able to do it online. She knows this.

## 2023-11-08 ENCOUNTER — Encounter: Payer: Self-pay | Admitting: Neurology

## 2023-11-08 ENCOUNTER — Ambulatory Visit (HOSPITAL_BASED_OUTPATIENT_CLINIC_OR_DEPARTMENT_OTHER)
Admission: RE | Admit: 2023-11-08 | Discharge: 2023-11-08 | Disposition: A | Discharge status: Home / Self Care | Source: Ambulatory Visit | Attending: Neurology | Admitting: Neurology

## 2023-11-08 DIAGNOSIS — H539 Unspecified visual disturbance: Secondary | ICD-10-CM | POA: Insufficient documentation

## 2023-11-08 DIAGNOSIS — H814 Vertigo of central origin: Secondary | ICD-10-CM | POA: Diagnosis not present

## 2023-11-08 DIAGNOSIS — R9082 White matter disease, unspecified: Secondary | ICD-10-CM | POA: Diagnosis not present

## 2023-11-08 DIAGNOSIS — S0990XA Unspecified injury of head, initial encounter: Secondary | ICD-10-CM

## 2023-11-08 DIAGNOSIS — S069X9A Unspecified intracranial injury with loss of consciousness of unspecified duration, initial encounter: Secondary | ICD-10-CM | POA: Diagnosis not present

## 2023-11-08 DIAGNOSIS — R790 Abnormal level of blood mineral: Secondary | ICD-10-CM | POA: Diagnosis not present

## 2023-11-08 DIAGNOSIS — X58XXXA Exposure to other specified factors, initial encounter: Secondary | ICD-10-CM | POA: Diagnosis not present

## 2023-11-08 DIAGNOSIS — G4733 Obstructive sleep apnea (adult) (pediatric): Secondary | ICD-10-CM

## 2023-11-08 DIAGNOSIS — R42 Dizziness and giddiness: Secondary | ICD-10-CM

## 2023-11-08 DIAGNOSIS — I771 Stricture of artery: Secondary | ICD-10-CM | POA: Diagnosis not present

## 2023-11-08 MED ORDER — GADOBUTROL 1 MMOL/ML IV SOLN
6.0000 mL | Freq: Once | INTRAVENOUS | Status: AC | PRN
  Administered 2023-11-08: 6 mL via INTRAVENOUS
  Filled 2023-11-08: qty 6

## 2023-11-14 ENCOUNTER — Other Ambulatory Visit

## 2023-11-25 ENCOUNTER — Encounter: Payer: Self-pay | Admitting: Neurology

## 2023-11-26 ENCOUNTER — Ambulatory Visit: Payer: Self-pay | Admitting: Neurology

## 2023-12-12 ENCOUNTER — Ambulatory Visit: Admitting: Physician Assistant

## 2023-12-12 ENCOUNTER — Encounter: Payer: Self-pay | Admitting: Physician Assistant

## 2023-12-12 VITALS — BP 120/60 | HR 100 | Ht 59.0 in | Wt 133.4 lb

## 2023-12-12 DIAGNOSIS — R194 Change in bowel habit: Secondary | ICD-10-CM | POA: Diagnosis not present

## 2023-12-12 DIAGNOSIS — R140 Abdominal distension (gaseous): Secondary | ICD-10-CM | POA: Diagnosis not present

## 2023-12-12 DIAGNOSIS — H401133 Primary open-angle glaucoma, bilateral, severe stage: Secondary | ICD-10-CM | POA: Diagnosis not present

## 2023-12-12 DIAGNOSIS — D509 Iron deficiency anemia, unspecified: Secondary | ICD-10-CM

## 2023-12-12 DIAGNOSIS — R110 Nausea: Secondary | ICD-10-CM

## 2023-12-12 DIAGNOSIS — R1013 Epigastric pain: Secondary | ICD-10-CM

## 2023-12-12 DIAGNOSIS — R6881 Early satiety: Secondary | ICD-10-CM

## 2023-12-12 DIAGNOSIS — K449 Diaphragmatic hernia without obstruction or gangrene: Secondary | ICD-10-CM | POA: Diagnosis not present

## 2023-12-12 MED ORDER — NA SULFATE-K SULFATE-MG SULF 17.5-3.13-1.6 GM/177ML PO SOLN: 1.0000 | Freq: Once | ORAL | 0 refills | Status: AC

## 2023-12-12 MED ORDER — ONDANSETRON HCL 4 MG PO TABS: 4.0000 mg | ORAL_TABLET | Freq: Four times a day (QID) | ORAL | 5 refills | Status: AC | PRN

## 2023-12-12 MED ORDER — METOCLOPRAMIDE HCL 10 MG PO TABS: ORAL_TABLET | ORAL | 0 refills | Status: AC

## 2023-12-12 NOTE — Patient Instructions (Signed)
 We have sent the following medications to your pharmacy for you to pick up at your convenience: Reglan  10 mg 1 tablet 20-30 minutes before drinking colonoscopy prep.  Zofran  4 mg every 4-6 hours as needed for nausea.   You have been scheduled for an endoscopy and colonoscopy. Please follow the written instructions given to you at your visit today.  If you use inhalers (even only as needed), please bring them with you on the day of your procedure.  DO NOT TAKE 7 DAYS PRIOR TO TEST- Trulicity (dulaglutide) Ozempic, Wegovy (semaglutide) Mounjaro (tirzepatide) Bydureon Bcise (exanatide extended release)  DO NOT TAKE 1 DAY PRIOR TO YOUR TEST Rybelsus (semaglutide) Adlyxin (lixisenatide) Victoza (liraglutide) Byetta (exanatide) ________________________________________________________________________

## 2023-12-12 NOTE — Progress Notes (Signed)
 Chief Complaint: Abdominal pain, nausea, diarrhea and anemia  HPI:    Anna Arnold is a 73 year old Caucasian female with a past medical history as listed below including fibromyalgia and GERD, known to Dr. Shila, who returns to clinic today with a complaint of abdominal pain, nausea, diarrhea and anemia.      06/28/2016 colonoscopy with Dr. Shila with diverticulosis in the sigmoid and descending colon, nonbleeding internal hemorrhoids and otherwise normal.  Repeat recommended 10 years.    06/23/2023 CBC normal.  BMP with a creatinine of 1.04 and otherwise normal.    07/16/2023 CT of the chest showed small hepatic cysts, slight thickening of the body of the left adrenal gland, moderate to large hiatal hernia and otherwise unremarkable.    10/04/2023 patient seen in clinic by me for throat clearing issues.  She had been seen by pulmonary allergist who gave her a nasal spray that seemed to help a bit with the throat clearing but she continued to have reflux.  Briefly discussed new diagnosis of iron deficiency anemia.  At that point increase Pantoprazole  back to 20 mg twice a day.  Discussed possible EGD for further evaluation if no symptoms had changed at follow-up.  Also discussed that we can add a colonoscopy given her iron deficiency anemia.    10/21/2023 CBC normal.  CMP with a glucose of 104 and creatinine 1.04.    Today, patient presents to clinic accompanied by her husband.  Ever since April she has had a worsening of symptoms.  Tells me that she has epigastric pain and bloating, feels early satiety and now has morning sickness it feels like every day.  She wakes up nauseous and then it will last all day long and comes on and off in severity.  No vomiting.  Also has a change in bowel habits now sometimes 6 loose stools a day and other times occasionally solid stool.  Cannot tell what triggers this.  Also remains on an iron supplement given a description of some iron deficiency that is ongoing.   Associated symptoms include epigastric pain.    Tells me she has had iron deficiency for years, even back in 2009 and had a double at the time and apparently had some erosive esophagitis.    Denies fever, chills or weight loss.  Past Medical History:  Diagnosis Date   Anxiety    Arthritis    Cataracts, bilateral    Cervical spondylosis    Degenerative disc disease, lumbar    Depression    Fibromyalgia    GERD (gastroesophageal reflux disease)    Glaucoma    Hypertension    Migraines    Osteopenia    Seasonal allergies    Sleep disorder     Past Surgical History:  Procedure Laterality Date   bunionectomy Right 2008   CATARACT EXTRACTION Bilateral 2015-2016   CESAREAN SECTION  1982   GLAUCOMA SURGERY Bilateral 2013   HAMMER TOE SURGERY Right 2008   ROTATOR CUFF REPAIR Right 2012    Current Outpatient Medications  Medication Sig Dispense Refill   fluticasone  (FLONASE ) 50 MCG/ACT nasal spray Place 1 spray into both nostrils daily. (Patient taking differently: Place 1 spray into both nostrils as needed.) 18.2 mL 2   ACETAMINOPHEN PO Take 650 mg by mouth as needed.     albuterol  (VENTOLIN  HFA) 108 (90 Base) MCG/ACT inhaler INHALE TWO PUFFS BY MOUTH EVERY 6 HOURS AS NEEDED FOR WHEEZING AND FOR SHORTNESS OF BREATH 18 g 5  alendronate (FOSAMAX) 70 MG tablet Take 70 mg by mouth once a week. Take with a full glass of water on an empty stomach.     azelastine  (ASTELIN ) 0.1 % nasal spray Place 2 sprays into both nostrils 2 (two) times daily. Use in each nostril as directed 30 mL 5   buPROPion (WELLBUTRIN SR) 100 MG 12 hr tablet Take 100 mg by mouth daily.     Cholecalciferol (VITAMIN D3) 1000 units CAPS Take by mouth.     Coenzyme Q10 (COQ10) 200 MG CAPS Take 200 mg by mouth 2 (two) times daily.     Evolocumab  (REPATHA  SURECLICK) 140 MG/ML SOAJ INJECT 140MG  UNDER THE SKIN EVERY 14 DAYS 6 mL 3   Ferrous Sulfate (IRON) 325 (65 Fe) MG TABS Take 325 mg by mouth every other day.      Gluc-Chonn-MSM-Boswellia-Vit D (GLUCOSAMINE CHOND TRIPLE/VIT D PO) Take 1 tablet by mouth daily.     losartan (COZAAR) 100 MG tablet Take 100 mg by mouth daily.     meclizine  (ANTIVERT ) 25 MG tablet Take 1 tablet (25 mg total) by mouth 2 (two) times daily as needed for dizziness. 30 tablet 0   Multiple Minerals-Vitamins (CALCIUM -MAGNESIUM-ZINC-D3 PO) Take by mouth.     Omega-3 Fatty Acids (ULTRA OMEGA-3 FISH OIL) 1400 MG CAPS      pantoprazole  (PROTONIX ) 20 MG tablet Take 1 tablet (20 mg total) by mouth 2 (two) times daily. (Patient taking differently: Take 20 mg by mouth 2 (two) times daily. Hold Protonix  for 1 week. May restart in 1 week if you don't notice changes after stopping it.) 60 tablet 5   SUMAtriptan (IMITREX) 100 MG tablet Take 100 mg by mouth every 2 (two) hours as needed for migraine. Take 1/2 tablet for migraine. May repeat in 2 hours if headache persists or recurs.     No current facility-administered medications for this visit.    Allergies as of 12/12/2023 - Review Complete 12/12/2023  Allergen Reaction Noted   Brimonidine Other (See Comments) 08/31/2020   Ceftin [cefuroxime axetil]  02/13/2016   Netarsudil Other (See Comments) 08/31/2020   Netarsudil dimesylate Dermatitis, Itching, Swelling, and Other (See Comments) 11/19/2019   Viibryd [vilazodone hcl]  02/13/2016   Benzonatate  Nausea Only 08/07/2023   Escitalopram Nausea Only 11/15/2023   Montelukast  11/15/2023   Rosuvastatin  Other (See Comments) 11/15/2023   Travoprost (bak free)  12/12/2023   Amitriptyline  02/13/2016   Atenolol  02/13/2016   Augmentin [amoxicillin-pot clavulanate] Nausea Only 02/13/2016   Biaxin [clarithromycin] Nausea Only 02/13/2016   Brinzolamide Anxiety 12/09/2019   Chlorthalidone  02/13/2016   Citalopram hydrobromide Other (See Comments) 02/13/2016   Erythromycin Rash 04/30/2008   Fluoxetine Other (See Comments) 02/13/2016   Lexapro [escitalopram oxalate]  02/13/2016   Meloxicam Nausea  Only 02/13/2016   Methazolamide Anxiety 11/15/2023   Potassium chloride  02/13/2016   Reglan  [metoclopramide ]  02/13/2016   Sertraline hcl Other (See Comments) 02/13/2016   Simvastatin  02/13/2016   Singulair [montelukast sodium]  02/13/2016   Travoprost  12/09/2019   Zegerid [omeprazole] Nausea Only 02/13/2016    Family History  Problem Relation Age of Onset   Cancer Mother    Depression Mother    Heart disease Father    Heart attack Brother    Depression Brother    Colon cancer Neg Hx    Colon polyps Neg Hx    Esophageal cancer Neg Hx    Rectal cancer Neg Hx    Stomach cancer Neg  Hx     Social History   Socioeconomic History   Marital status: Married    Spouse name: Not on file   Number of children: Not on file   Years of education: Not on file   Highest education level: Not on file  Occupational History   Occupation: retired  Tobacco Use   Smoking status: Never    Passive exposure: Past   Smokeless tobacco: Never  Vaping Use   Vaping status: Never Used  Substance and Sexual Activity   Alcohol use: No   Drug use: No   Sexual activity: Not on file  Other Topics Concern   Not on file  Social History Narrative   Not on file   Social Drivers of Health   Financial Resource Strain: Not on file  Food Insecurity: Not on file  Transportation Needs: Not on file  Physical Activity: Not on file  Stress: No Stress Concern Present (01/11/2021)   Received from [facility] - Occupational Stress Questionnaire    Feeling of Stress : Not at all  Social Connections: Unknown (10/10/2021)   Received from [facility]   Social Network    Social Network: Not on file  Intimate Partner Violence: Unknown (09/01/2021)   Received from [facility]   HITS    Physically Hurt: Not on file    Insult or Talk Down To: Not on file    Threaten Physical Harm: Not on file    Scream or Curse: Not on file    Review of Systems:     Constitutional: No weight loss, fever or chills  Cardiovascular: No chest pain Respiratory: No SOB  Gastrointestinal: See HPI and otherwise negative   Physical Exam:  Vital signs: BP 120/60 (BP Location: Left Arm, Patient Position: Sitting, Cuff Size: Normal)   Pulse 100   Ht 4' 11 (1.499 m) Comment: height measured without shoes  Wt 133 lb 6 oz (60.5 kg)   BMI 26.94 kg/m    Constitutional:   Pleasant elderly Caucasian female appears to be in NAD, Well developed, Well nourished, alert and cooperative Respiratory: Respirations even and unlabored. Lungs clear to auscultation bilaterally.   No wheezes, crackles, or rhonchi.  Cardiovascular: Normal S1, S2. No MRG. Regular rate and rhythm. No peripheral edema, cyanosis or pallor.  Gastrointestinal:  Soft, nondistended, nontender. No rebound or guarding. Normal bowel sounds. No appreciable masses or hepatomegaly. Rectal:  Not performed.  Psychiatric: Demonstrates good judgement and reason without abnormal affect or behaviors.  RELEVANT LABS AND IMAGING: CBC    Component Value Date/Time   WBC 5.2 10/21/2023 1018   RBC 4.74 10/21/2023 1018   HGB 13.5 10/21/2023 1018   HGB 14.5 09/03/2006 1318   HCT 40.4 10/21/2023 1018   HCT 42.6 09/03/2006 1318   PLT 254 10/21/2023 1018   PLT 201 09/03/2006 1318   MCV 85.2 10/21/2023 1018   MCV 87 09/03/2006 1318   MCH 28.5 10/21/2023 1018   MCHC 33.4 10/21/2023 1018   RDW 14.6 10/21/2023 1018   RDW 13.1 09/03/2006 1318   LYMPHSABS 1.0 10/21/2023 1018   LYMPHSABS 2.1 09/03/2006 1318   MONOABS 0.5 10/21/2023 1018   EOSABS 0.0 10/21/2023 1018   EOSABS 0.1 09/03/2006 1318   BASOSABS 0.1 10/21/2023 1018   BASOSABS 0.0 09/03/2006 1318    CMP     Component Value Date/Time   NA 137 10/21/2023 1018   K 4.1 10/21/2023 1018   CL 99 10/21/2023  1018   CO2 23 10/21/2023 1018   GLUCOSE 104 (H) 10/21/2023 1018   BUN 17 10/21/2023 1018   CREATININE 1.04 (H) 10/21/2023 1018   CALCIUM  10.0  10/21/2023 1018   PROT 6.9 10/21/2023 1018   ALBUMIN 4.4 10/21/2023 1018   AST 26 10/21/2023 1018   ALT 14 10/21/2023 1018   ALKPHOS 67 10/21/2023 1018   BILITOT 0.3 10/21/2023 1018   GFRNONAA 56 (L) 10/21/2023 1018   GFRAA >60 07/06/2019 1057    Assessment: 1.  Nausea/epigastric pain/bloating/early satiety: All worsened over the past 3 to 4 months, no change with increasing Pantoprazole  to 20 mg twice a day; consider relation to hiatal hernia +/- functional symptoms +/- IBS +/- gastritis 2.  Change in bowel habits: At x 6 loose stools a day, other times solid stool; consider IBS versus relation to above 3.  Iron deficiency anemia: Appears to have a long history of this, apparently erosive esophagitis back in 2009, no workup recently remains on oral iron supplementation, we are working on getting her most recent labs for this; sitter possible relation to GI system including possible Cameron erosions from worsening hiatal hernia 4.  Hiatal hernia: Last noted as moderate to large on a CT of the chest in February; could be contributing to symptoms above +/- Cameron erosions  Plan: 1.  Scheduled patient for diagnostic EGD and colonoscopy in the [facility] given change in bowel habits, abdominal pain, nausea, IDA and hiatal hernia.  This is scheduled with Dr. Shila in [facility].  Did provide the patient a detailed list of risks for procedures and she agrees to proceed. Patient is appropriate for endoscopic procedure(s) in the ambulatory ([facility]) setting.  2.  Patient was given Reglan  10 mg, 1 tab 20 to 30 minutes before the first half of prep and repeat for the second half.  #2 with no refill 3.  Continue Pantoprazole  20 mg twice daily for now. 4.  Continue iron supplementation. 5.  Will attempt to get recent labs. 6.  Patient to follow in clinic per recommendations after time of procedures.  Delon Failing, PA-C [facility] 12/12/2023, 10:49 AM  Cc: Dayna Motto, DO

## 2023-12-19 DIAGNOSIS — E611 Iron deficiency: Secondary | ICD-10-CM | POA: Diagnosis not present

## 2023-12-22 NOTE — Progress Notes (Unsigned)
 Cardiology Clinic Note   Patient Name: Anna Arnold Date of Encounter: 12/23/2023  Primary Care Provider:  Dayna Motto, DO Primary Cardiologist:  Lonni LITTIE Nanas, MD  Patient Profile    Anna Arnold 73 year old female presents the clinic today for follow-up evaluation of her lightheadedness and shortness of breath.  Past Medical History    Past Medical History:  Diagnosis Date   Anxiety    Arthritis    Cataracts, bilateral    Cervical spondylosis    Degenerative disc disease, lumbar    Depression    Fibromyalgia    GERD (gastroesophageal reflux disease)    Glaucoma    Hypertension    Migraines    Osteopenia    Seasonal allergies    Sleep disorder    Past Surgical History:  Procedure Laterality Date   bunionectomy Right 2008   CATARACT EXTRACTION Bilateral 2015-2016   CESAREAN SECTION  1982   GLAUCOMA SURGERY Bilateral 2013   HAMMER TOE SURGERY Right 2008   ROTATOR CUFF REPAIR Right 2012    Allergies  Allergies  Allergen Reactions   Brimonidine Other (See Comments)    swelling    Ceftin [Cefuroxime Axetil]     Diarrhea and vomiting   Netarsudil Other (See Comments)   Netarsudil Dimesylate Dermatitis, Itching, Swelling and Other (See Comments)    In eyes, itching     Viibryd [Vilazodone Hcl]     agitation   Benzonatate  Nausea Only   Escitalopram Nausea Only    Agitation   Montelukast     Severe agitation, pruritus   Rosuvastatin  Other (See Comments)    Body aches   Travoprost (Bak Free)     Other Reaction(s): blurred vision   Amitriptyline     sedation   Atenolol     fatigue   Augmentin [Amoxicillin-Pot Clavulanate] Nausea Only    GI pain   Biaxin [Clarithromycin] Nausea Only    GI pain   Brinzolamide Anxiety   Chlorthalidone     cramping   Citalopram Hydrobromide Other (See Comments)    fatigue   Erythromycin Rash   Fluoxetine Other (See Comments)    agitation   Lexapro [Escitalopram Oxalate]     Agitation and nausea    Meloxicam Nausea Only    Nausea    Methazolamide Anxiety    Depression, increased BP,lack of energy   Potassium Chloride     Abdominal pain   Reglan  [Metoclopramide ]     Abdominal pain   Sertraline Hcl Other (See Comments)    fatigue   Simvastatin     Joint and muscle pain   Singulair [Montelukast Sodium]     Severe agitation, pruritis    Travoprost    Zegerid [Omeprazole] Nausea Only    nausea    History of Present Illness    Anna Arnold has a PMH of anxiety, arthritis, cervical spondylosis, lumbar degenerative disc disease, fibromyalgia, GERD, hypertension, seasonal allergies and sleep disorder.  Nuclear stress test 7/21 showed normal perfusion.  Echocardiogram 8/21 showed normal biventricular function and no significant valvular disease.  Cardiac event monitor for 14 days showed 10 beat run of NSVT and was otherwise not significant.  Coronary calcium  score 10/21 showed a score of 0.  An echocardiogram 6/23 showed normal EF and no significant valvular abnormalities.  She was seen and evaluated by Dr. Nanas on 03/29/2023.  She was doing well at that time.  She denied chest pain and shortness of breath.  She did  note some lightheadedness with standing and denied syncope.  She report some swelling in her left leg.  She reported having palpitations and noted that her Fitbit had been recording higher heart rates.  She reported that 1-2 nights per week she was feeling palpitations at night that were lasting for short durations.  Cardiac event monitor was ordered and showed no significant heart arrhythmia 04/23/2023.  She was seen and evaluated in the emergency department on 10/21/2023.  She had multiple complaints at that time.  She noted that most of May she had intermittent episodes of lightheadedness, shortness of breath, and intermittent blurred vision.  She also reported early satiety.  She saw her GI doctor who evaluated her hiatal hernia.  Her pantoprazole  was increased.  She  saw her PCP on the 16th and 23rd for her symptoms.  Outpatient testing was arranged.  In the emergency department she noted that her symptoms were worsening and noted persistence of her symptoms.  She was concerned due to an upcoming trip on June 20.  Her chest x-ray showed chronic lung disease with no acute findings.  Her CMP showed a creatinine of 1.04 with normal electrolytes.  Her TSH was normal.  Her CBC was unremarkable.  Her urinalysis was unremarkable.  Her cardiac troponins were low and flat.  She was not noted to have neurologic deficits.  No carotid bruits were noted on auscultation.  She was treated with Ativan  and meclizine .  She received IV fluids.  It was felt that her early fullness could be attributed to her hiatal hernia.  She was not noted to have any abdominal tenderness on exam.  It was recommended that she have further follow-up at [facility] emergency department with head CT.  She preferred to stay at [facility].  She was reassessed around 2 PM.  Her symptoms had mostly improved.  She was able to ambulate to the bathroom without difficulty.  She was eating and drinking in the emergency department.  Due to her reassuring workup it was felt that she could be discharged and follow-up as an outpatient.  She presented to the clinic 10/23/23 for follow-up evaluation and stated she continued to have shortness of breath, dizziness, and early satiety.  We reviewed her recent emergency department visit.  She and her husband expressed understanding.  She noted that her symptoms started at the beginning of May.  She was in the process of having an appointment with her neurologist for head CT.  We reviewed her previous cardiology visit, cardiac event monitor and echocardiogram.  She asked if she should have stress testing.  Due to her coronary calcium  score of 0 in 2021 and well-controlled cholesterol I will deferred this at the time.  I ordered echocardiogram and planned follow-up in 2 months.  Her  echocardiogram 10/24/2023 showed normal LVEF, normal diastolic parameters, and normal cardiac valves.  She presents to the clinic today for follow-up evaluation and states she has been monitoring her blood pressure at home and is concerned that some of her blood pressures have been elevated.  On review her blood pressures have been stable in the 130s over 80s.  She does have occasional episodes of elevated blood pressure in the 140-150 systolic range.  These are rare.  I reassured her.  We reviewed her recent visit with GI.  She has planned upper endoscopy and colonoscopy to evaluate her hiatal hernia.  I will continue her current medication regimen.  We will plan for repeat fasting lipids in 1-2 months  as well as LFTs.  I will plan follow-up in 9 months.  Today she denies lower extremity edema, fatigue, palpitations, melena, hematuria, hemoptysis, diaphoresis, weakness, presyncope, syncope, orthopnea, and PND.    Home Medications    Prior to Admission medications   Medication Sig Start Date End Date Taking? Authorizing Provider  ABRYSVO 120 MCG/0.5ML injection Inject 0.5 mLs into the muscle once. Patient not taking: Reported on 03/29/2023 12/10/22   [provider]  acetaminophen (TYLENOL) 500 MG tablet 2 tablets Orally once a day scheduled    [provider]  ACETAMINOPHEN PO Take 650 mg by mouth as needed.    [provider]  albuterol  (VENTOLIN  HFA) 108 (90 Base) MCG/ACT inhaler INHALE TWO PUFFS BY MOUTH EVERY 6 HOURS AS NEEDED FOR WHEEZING AND FOR SHORTNESS OF BREATH 11/13/22   Shelah Lamar RAMAN, MD  alendronate (FOSAMAX) 70 MG tablet Take 70 mg by mouth once a week. Take with a full glass of water on an empty stomach.    [provider]  azelastine  (ASTELIN ) 0.1 % nasal spray Place 2 sprays into both nostrils 2 (two) times daily. Use in each nostril as directed 08/07/23   Cobb, Comer GAILS, NP  benzonatate  (TESSALON ) 200 MG capsule Take 200 mg by mouth as needed.  12/06/21   [provider]  buPROPion (WELLBUTRIN SR) 100 MG 12 hr tablet Take 100 mg by mouth daily.    [provider]  Cholecalciferol (VITAMIN D3) 1000 units CAPS Take by mouth.    [provider]  Coenzyme Q10 (COQ10) 200 MG CAPS Take 200 mg by mouth 2 (two) times daily.    [provider]  COMIRNATY syringe Inject 0.3 mLs into the muscle once. Patient not taking: Reported on 03/29/2023 01/24/23   [provider]  Evolocumab  (REPATHA  SURECLICK) 140 MG/ML SOAJ INJECT 140MG  UNDER THE SKIN EVERY 14 DAYS 10/22/23   Kate Lonni CROME, MD  fluticasone  (FLONASE ) 50 MCG/ACT nasal spray Place 1 spray into both nostrils daily. 08/07/23   Cobb, Comer GAILS, NP  Gluc-Chonn-MSM-Boswellia-Vit D (GLUCOSAMINE CHOND TRIPLE/VIT D PO) Take 1 tablet by mouth daily.    [provider]  losartan (COZAAR) 100 MG tablet Take 100 mg by mouth daily.    [provider]  meclizine  (ANTIVERT ) 25 MG tablet Take 25 mg by mouth as needed for dizziness.    [provider]  Multiple Minerals-Vitamins (CALCIUM -MAGNESIUM-ZINC-D3 PO) Take by mouth.    [provider]  Omega-3 Fatty Acids (ULTRA OMEGA-3 FISH OIL) 1400 MG CAPS     [provider]  pantoprazole  (PROTONIX ) 20 MG tablet Take 1 tablet (20 mg total) by mouth 2 (two) times daily. 10/04/23   Beather Delon Gibson, PA  SUMAtriptan (IMITREX) 100 MG tablet Take 100 mg by mouth every 2 (two) hours as needed for migraine. Take 1/2 tablet for migraine. May repeat in 2 hours if headache persists or recurs.    [provider]    Family History    Family History  Problem Relation Age of Onset   Cancer Mother    Depression Mother    Heart disease Father    Heart attack Brother    Depression Brother    Colon cancer Neg Hx    Colon polyps Neg Hx    Esophageal cancer Neg Hx    Rectal cancer Neg Hx    Stomach cancer Neg Hx    She indicated that the status of her mother is  unknown. She indicated  that the status of her father is unknown. She indicated that her brother is deceased. She indicated that the status of her neg hx is unknown.  Social History    Social History   Socioeconomic History   Marital status: Married    Spouse name: Not on file   Number of children: Not on file   Years of education: Not on file   Highest education level: Not on file  Occupational History   Occupation: retired  Tobacco Use   Smoking status: Never    Passive exposure: Past   Smokeless tobacco: Never  Vaping Use   Vaping status: Never Used  Substance and Sexual Activity   Alcohol use: No   Drug use: No   Sexual activity: Not on file  Other Topics Concern   Not on file  Social History Narrative   Not on file   Social Drivers of Health   Financial Resource Strain: Not on file  Food Insecurity: Not on file  Transportation Needs: Not on file  Physical Activity: Not on file  Stress: No Stress Concern Present (01/11/2021)   Received from [facility] - Occupational Stress Questionnaire    Feeling of Stress : Not at all  Social Connections: Unknown (10/10/2021)   Received from [facility]   Social Network    Social Network: Not on file  Intimate Partner Violence: Unknown (09/01/2021)   Received from [facility]   HITS    Physically Hurt: Not on file    Insult or Talk Down To: Not on file    Threaten Physical Harm: Not on file    Scream or Curse: Not on file     Review of Systems    General:  No chills, fever, night sweats or weight changes.  Cardiovascular:  No chest pain, dyspnea on exertion, edema, orthopnea, palpitations, paroxysmal nocturnal dyspnea. Dermatological: No rash, lesions/masses Respiratory: No cough, dyspnea Urologic: No hematuria, dysuria Abdominal:   No nausea, vomiting, diarrhea, bright red blood per rectum, melena, or hematemesis Neurologic:  No visual changes, wkns, changes in mental  status. All other systems reviewed and are otherwise negative except as noted above.  Physical Exam    VS:  BP 134/88   Pulse 73   Ht 4' 11 (1.499 m)   Wt 134 lb 12.8 oz (61.1 kg)   SpO2 95%   BMI 27.23 kg/m  , BMI Body mass index is 27.23 kg/m. GEN: Well nourished, well developed, in no acute distress. HEENT: normal. Neck: Supple, no JVD, carotid bruits, or masses. Cardiac: RRR, no murmurs, rubs, or gallops. No clubbing, cyanosis, edema.  Radials/DP/PT 2+ and equal bilaterally.  Respiratory:  Respirations regular and unlabored, clear to auscultation bilaterally. GI: Soft, nontender, nondistended, BS + x 4. MS: no deformity or atrophy. Skin: warm and dry, no rash. Neuro:  Strength and sensation are intact. Psych: Normal affect.  Accessory Clinical Findings    Recent Labs: 10/21/2023: ALT 14; BUN 17; Creatinine, Ser 1.04; Hemoglobin 13.5; Platelets 254; Potassium 4.1; Sodium 137; TSH 1.640   Recent Lipid Panel    Component Value Date/Time   CHOL 174 01/18/2023 0826   TRIG 91 01/18/2023 0826   HDL 71 01/18/2023 0826   CHOLHDL 2.5 01/18/2023 0826   LDLCALC 86 01/18/2023 0826         ECG personally reviewed by me today-none today.    Echocardiogram 11/15/2021  IMPRESSIONS     1. Left ventricular ejection fraction, by  estimation, is 60 to 65%. The  left ventricle has normal function. The left ventricle has no regional  wall motion abnormalities. Left ventricular diastolic parameters were  normal. The average left ventricular  global longitudinal strain is -24.7 %. The global longitudinal strain is  normal.   2. Right ventricular systolic function is normal. The right ventricular  size is normal. Tricuspid regurgitation signal is inadequate for assessing  PA pressure.   3. The mitral valve is normal in structure. No evidence of mitral valve  regurgitation. No evidence of mitral stenosis.   4. The aortic valve is tricuspid. Aortic valve regurgitation is not   visualized. No aortic stenosis is present.   5. The inferior vena cava is normal in size with greater than 50%  respiratory variability, suggesting right atrial pressure of 3 mmHg.   Comparison(s): No significant change from prior study.   Conclusion(s)/Recommendation(s): Normal biventricular function without  evidence of hemodynamically significant valvular heart disease.   FINDINGS   Left Ventricle: Left ventricular ejection fraction, by estimation, is 60  to 65%. The left ventricle has normal function. The left ventricle has no  regional wall motion abnormalities. The average left ventricular global  longitudinal strain is -24.7 %.  The global longitudinal strain is normal. 3D left ventricular ejection  fraction analysis performed but not reported based on interpreter  judgement due to suboptimal tracking. The left ventricular internal cavity  size was normal in size. There is no left  ventricular hypertrophy. Left ventricular diastolic parameters were  normal.   Right Ventricle: The right ventricular size is normal. No increase in  right ventricular wall thickness. Right ventricular systolic function is  normal. Tricuspid regurgitation signal is inadequate for assessing PA  pressure. The tricuspid regurgitant  velocity is 2.25 m/s, and with an assumed right atrial pressure of 3 mmHg,  the estimated right ventricular systolic pressure is 23.2 mmHg.   Left Atrium: Left atrial size was normal in size.   Right Atrium: Right atrial size was normal in size.   Pericardium: There is no evidence of pericardial effusion.   Mitral Valve: The mitral valve is normal in structure. No evidence of  mitral valve regurgitation. No evidence of mitral valve stenosis.   Tricuspid Valve: The tricuspid valve is normal in structure. Tricuspid  valve regurgitation is trivial. No evidence of tricuspid stenosis.   Aortic Valve: The aortic valve is tricuspid. Aortic valve regurgitation is  not  visualized. No aortic stenosis is present.   Pulmonic Valve: The pulmonic valve was normal in structure. Pulmonic valve  regurgitation is not visualized.   Aorta: The aortic root, ascending aorta, aortic arch and descending aorta  are all structurally normal, with no evidence of dilitation or  obstruction.   Venous: The inferior vena cava is normal in size with greater than 50%  respiratory variability, suggesting right atrial pressure of 3 mmHg.   IAS/Shunts: The atrial septum is grossly normal.   Echocardiogram 10/24/2023  IMPRESSIONS     1. Left ventricular ejection fraction, by estimation, is 60 to 65%. The  left ventricle has normal function. The left ventricle has no regional  wall motion abnormalities. Left ventricular diastolic parameters were  normal.   2. Right ventricular systolic function is normal. The right ventricular  size is normal. There is normal pulmonary artery systolic pressure. The  estimated right ventricular systolic pressure is 20.3 mmHg.   3. The mitral valve is normal in structure. No evidence of mitral valve  regurgitation. No evidence  of mitral stenosis.   4. The aortic valve is tricuspid. Aortic valve regurgitation is not  visualized. No aortic stenosis is present.   5. The inferior vena cava is normal in size with greater than 50%  respiratory variability, suggesting right atrial pressure of 3 mmHg.   Comparison(s): A prior study was performed on 11/15/2021. No significant  change from prior study.   FINDINGS   Left Ventricle: Left ventricular ejection fraction, by estimation, is 60  to 65%. The left ventricle has normal function. The left ventricle has no  regional wall motion abnormalities. 3D ejection fraction reviewed and  evaluated as part of the  interpretation. Alternate measurement of EF is felt to be most reflective  of LV function. The left ventricular internal cavity size was normal in  size. There is no left ventricular  hypertrophy. Left ventricular diastolic  parameters were normal.   Right Ventricle: The right ventricular size is normal. No increase in  right ventricular wall thickness. Right ventricular systolic function is  normal. There is normal pulmonary artery systolic pressure. The tricuspid  regurgitant velocity is 2.08 m/s, and   with an assumed right atrial pressure of 3 mmHg, the estimated right  ventricular systolic pressure is 20.3 mmHg.   Left Atrium: Left atrial size was normal in size.   Right Atrium: Right atrial size was normal in size.   Pericardium: There is no evidence of pericardial effusion.   Mitral Valve: The mitral valve is normal in structure. No evidence of  mitral valve regurgitation. No evidence of mitral valve stenosis.   Tricuspid Valve: The tricuspid valve is normal in structure. Tricuspid  valve regurgitation is mild . No evidence of tricuspid stenosis.   Aortic Valve: The aortic valve is tricuspid. Aortic valve regurgitation is  not visualized. No aortic stenosis is present.   Pulmonic Valve: The pulmonic valve was normal in structure. Pulmonic valve  regurgitation is not visualized. No evidence of pulmonic stenosis.   Aorta: The aortic root and ascending aorta are structurally normal, with  no evidence of dilitation.   Venous: The inferior vena cava is normal in size with greater than 50%  respiratory variability, suggesting right atrial pressure of 3 mmHg.   IAS/Shunts: The atrial septum is grossly normal.   Cardiac event monitor 04/23/2023    2 episodes of SVT, longest lasting 9 beats   No significant arrhythmias     Patch Wear Time:  13 days and 23 hours (2024-11-04T16:17:34-0500 to 2024-11-18T15:31:15-0500)   Patient had a min HR of 49 bpm, max HR of 148 bpm, and avg HR of 80 bpm. Predominant underlying rhythm was Sinus Rhythm. 2 Supraventricular Tachycardia runs occurred, the run with the fastest interval lasting 6 beats with a max rate of 148  bpm, the  longest lasting 9 beats with an avg rate of 111 bpm. Isolated SVEs were rare (<1.0%), and no SVE Couplets or SVE Triplets were present. Isolated VEs were rare (<1.0%), VE Couplets were rare (<1.0%), and no VE Triplets were present. Ventricular Trigeminy  was present.    Assessment & Plan   1. DOE, Shortness of breath-continues to have shortness of breath.  Stable.  Echocardiogram reassuring.  Details above.  Has scheduled EGD and colonoscopy. Maintain physical activity Heart healthy low-sodium diet Patient reassured.  Dizziness-heart rate today 73 bpm.  Stable without further episodes.  Does not appear to be cardiac in nature.   Maintain p.o. hydration Change positions slowly Continues to follow with neurology  Essential hypertension-BP  today 134/88 Continue current medical therapy Heart healthy low-sodium diet  Hyperlipidemia-LDL 86 on 01/18/2023. Continue Repatha , co-Q10, omega-3 fatty acids High-fiber diet Plan for repeat fasting lipids and LFTs 8/25  Disposition: Follow-up with Dr. Kate or me in 6-9 months   Josefa HERO. Lain Tetterton NP-C     12/23/2023, 11:29 AM [facility] [address] Office [phone] Fax [phone]    I spent 14 minutes examining this patient, reviewing medications, and using patient centered shared decision making involving their cardiac care.   I spent  20 minutes reviewing past medical history,  medications, and prior cardiac tests.

## 2023-12-23 ENCOUNTER — Ambulatory Visit: Attending: General Practice | Admitting: General Practice

## 2023-12-23 ENCOUNTER — Encounter: Payer: Self-pay | Admitting: General Practice

## 2023-12-23 VITALS — BP 134/88 | HR 73 | Ht 59.0 in | Wt 134.8 lb

## 2023-12-23 DIAGNOSIS — R0609 Other forms of dyspnea: Secondary | ICD-10-CM

## 2023-12-23 DIAGNOSIS — E785 Hyperlipidemia, unspecified: Secondary | ICD-10-CM

## 2023-12-23 DIAGNOSIS — I10 Essential (primary) hypertension: Secondary | ICD-10-CM | POA: Diagnosis not present

## 2023-12-23 DIAGNOSIS — R42 Dizziness and giddiness: Secondary | ICD-10-CM | POA: Diagnosis not present

## 2023-12-23 NOTE — Patient Instructions (Signed)
 Medication Instructions:  No medication changes were made at this visit. Continue current regimen.   It is okay to take CoQ10 in the morning or the evening.  *If you need a refill on your cardiac medications before your next appointment, please call your pharmacy*  Lab Work: To be completed in 1-2 months: FASTING lipid panel and liver function panel  If you have labs (blood work) drawn today and your tests are completely normal, you will receive your results only by: MyChart Message (if you have MyChart) OR A paper copy in the mail If you have any lab test that is abnormal or we need to change your treatment, we will call you to review the results.  Testing/Procedures: None ordered today.  Follow-Up: At [facility], you and your health needs are our priority.  As part of our continuing mission to provide you with exceptional heart care, our providers are all part of one team.  This team includes your primary Cardiologist (physician) and Advanced Practice Providers or APPs (Physician Assistants and Nurse Practitioners) who all work together to provide you with the care you need, when you need it.  Your next appointment:   9 month(s)  Provider:   Josefa Beauvais, NP or Dr. Delcie

## 2023-12-26 DIAGNOSIS — J4520 Mild intermittent asthma, uncomplicated: Secondary | ICD-10-CM | POA: Diagnosis not present

## 2023-12-26 DIAGNOSIS — M810 Age-related osteoporosis without current pathological fracture: Secondary | ICD-10-CM | POA: Diagnosis not present

## 2023-12-26 DIAGNOSIS — I129 Hypertensive chronic kidney disease with stage 1 through stage 4 chronic kidney disease, or unspecified chronic kidney disease: Secondary | ICD-10-CM | POA: Diagnosis not present

## 2023-12-26 DIAGNOSIS — F339 Major depressive disorder, recurrent, unspecified: Secondary | ICD-10-CM | POA: Diagnosis not present

## 2024-01-03 DIAGNOSIS — R252 Cramp and spasm: Secondary | ICD-10-CM | POA: Diagnosis not present

## 2024-01-06 ENCOUNTER — Telehealth: Payer: Self-pay | Admitting: Cardiology

## 2024-01-06 DIAGNOSIS — M25551 Pain in right hip: Secondary | ICD-10-CM | POA: Diagnosis not present

## 2024-01-06 DIAGNOSIS — M25552 Pain in left hip: Secondary | ICD-10-CM | POA: Diagnosis not present

## 2024-01-06 DIAGNOSIS — M545 Low back pain, unspecified: Secondary | ICD-10-CM | POA: Diagnosis not present

## 2024-01-06 NOTE — Telephone Encounter (Signed)
 Pt c/o medication issue:  1. Name of Medication:  Repatha    2. How are you currently taking this medication (dosage and times per day)?   3. Are you having a reaction (difficulty breathing--STAT)?   4. What is your medication issue?   When patient tried to take Repatha  injection yesterday it didn't work. Arloa Prior [facility] advised to contact [employer] about defective medication injector. [facility]  will be contacting the office to verify medication. Case#: [id number]. They will be contacting the office in 24 hours.

## 2024-01-09 ENCOUNTER — Telehealth: Payer: Self-pay | Admitting: Cardiology

## 2024-01-09 NOTE — Telephone Encounter (Signed)
 Patient states that she is calling to speak with the nurse about her medication. Please advise

## 2024-01-09 NOTE — Telephone Encounter (Signed)
 Needing replacement of defective Repatha  [employer]  [facility] needs verification that the patient needs Repatha  before they can fill the replacement.  They told patient that they sent a request but our office has not responded to this request.    She contacted them on Monday 8/11 and they told her they would have sent it within 24 hrs of her call to their office.    [employer] gave her the name of the pharmacy to call ([facility] in [place], [place])  Pt requests we contact that company directly   [phone]. Case #  [id number].  She is aware I will forward to PharmD for review and follow up.

## 2024-01-13 ENCOUNTER — Encounter: Payer: Self-pay | Admitting: Gastroenterology

## 2024-01-13 ENCOUNTER — Ambulatory Visit: Admitting: Gastroenterology

## 2024-01-13 VITALS — BP 118/73 | HR 74 | Temp 97.9°F | Resp 13 | Ht 59.0 in | Wt 133.0 lb

## 2024-01-13 DIAGNOSIS — K259 Gastric ulcer, unspecified as acute or chronic, without hemorrhage or perforation: Secondary | ICD-10-CM

## 2024-01-13 DIAGNOSIS — K5730 Diverticulosis of large intestine without perforation or abscess without bleeding: Secondary | ICD-10-CM | POA: Diagnosis not present

## 2024-01-13 DIAGNOSIS — K449 Diaphragmatic hernia without obstruction or gangrene: Secondary | ICD-10-CM

## 2024-01-13 DIAGNOSIS — R6881 Early satiety: Secondary | ICD-10-CM

## 2024-01-13 DIAGNOSIS — R110 Nausea: Secondary | ICD-10-CM | POA: Diagnosis not present

## 2024-01-13 DIAGNOSIS — K2950 Unspecified chronic gastritis without bleeding: Secondary | ICD-10-CM

## 2024-01-13 DIAGNOSIS — M797 Fibromyalgia: Secondary | ICD-10-CM | POA: Diagnosis not present

## 2024-01-13 DIAGNOSIS — K644 Residual hemorrhoidal skin tags: Secondary | ICD-10-CM

## 2024-01-13 DIAGNOSIS — R0989 Other specified symptoms and signs involving the circulatory and respiratory systems: Secondary | ICD-10-CM

## 2024-01-13 DIAGNOSIS — K219 Gastro-esophageal reflux disease without esophagitis: Secondary | ICD-10-CM | POA: Diagnosis not present

## 2024-01-13 DIAGNOSIS — R194 Change in bowel habit: Secondary | ICD-10-CM

## 2024-01-13 DIAGNOSIS — K648 Other hemorrhoids: Secondary | ICD-10-CM

## 2024-01-13 DIAGNOSIS — D509 Iron deficiency anemia, unspecified: Secondary | ICD-10-CM

## 2024-01-13 DIAGNOSIS — R140 Abdominal distension (gaseous): Secondary | ICD-10-CM | POA: Diagnosis not present

## 2024-01-13 DIAGNOSIS — R197 Diarrhea, unspecified: Secondary | ICD-10-CM | POA: Diagnosis not present

## 2024-01-13 DIAGNOSIS — G479 Sleep disorder, unspecified: Secondary | ICD-10-CM | POA: Diagnosis not present

## 2024-01-13 DIAGNOSIS — F32A Depression, unspecified: Secondary | ICD-10-CM | POA: Diagnosis not present

## 2024-01-13 DIAGNOSIS — F419 Anxiety disorder, unspecified: Secondary | ICD-10-CM | POA: Diagnosis not present

## 2024-01-13 MED ORDER — PANTOPRAZOLE SODIUM 40 MG PO TBEC: 40.0000 mg | DELAYED_RELEASE_TABLET | Freq: Two times a day (BID) | ORAL | 3 refills | Status: AC

## 2024-01-13 MED ORDER — SODIUM CHLORIDE 0.9 % IV SOLN: 500.0000 mL | Freq: Once | INTRAVENOUS | Status: AC

## 2024-01-13 NOTE — Progress Notes (Unsigned)
 Called to room to assist during endoscopic procedure.  Patient ID and intended procedure confirmed with present staff. Received instructions for my participation in the procedure from the performing physician.

## 2024-01-13 NOTE — Progress Notes (Unsigned)
 Pt's states no medical or surgical changes since previsit or office visit.

## 2024-01-13 NOTE — Op Note (Signed)
 [facility] Patient Name: Anna Arnold Procedure Date: 01/13/2024 2:44 PM MRN: [id number] Endoscopist: Gustav ALONSO Mcgee , MD, [id number] Age: 73 Referring MD:  Date of Birth: [birth year] Gender: Female Account #: [id number] Procedure:                Colonoscopy Indications:              Clinically significant diarrhea of unexplained                            origin Medicines:                Monitored Anesthesia Care Procedure:                Pre-Anesthesia Assessment:                           - Prior to the procedure, a History and Physical                            was performed, and patient medications and                            allergies were reviewed. The patient's tolerance of                            previous anesthesia was also reviewed. The risks                            and benefits of the procedure and the sedation                            options and risks were discussed with the patient.                            All questions were answered, and informed consent                            was obtained. Prior Anticoagulants: The patient has                            taken no anticoagulant or antiplatelet agents. ASA                            Grade Assessment: II - A patient with mild systemic                            disease. After reviewing the risks and benefits,                            the patient was deemed in satisfactory condition to                            undergo the procedure.  After obtaining informed consent, the colonoscope                            was passed under direct vision. Throughout the                            procedure, the patient's blood pressure, pulse, and                            oxygen saturations were monitored continuously. The                            Olympus Scope [id number] was introduced through the                            anus and advanced to the the cecum, identified  by                            appendiceal orifice and ileocecal valve. The                            colonoscopy was performed without difficulty. The                            patient tolerated the procedure well. The quality                            of the bowel preparation was good. The ileocecal                            valve, appendiceal orifice, and rectum were                            photographed. Scope In: 2:58:28 PM Scope Out: 3:07:10 PM Scope Withdrawal Time: 0 hours 6 minutes 2 seconds  Total Procedure Duration: 0 hours 8 minutes 42 seconds  Findings:                 The perianal and digital rectal examinations were                            normal.                           Normal mucosa was found in the entire colon.                            Biopsies for histology were taken with a cold                            forceps from the entire colon for evaluation of                            microscopic colitis.  Scattered small-mouthed diverticula were found in                            the sigmoid colon and descending colon.                           Non-bleeding external and internal hemorrhoids were                            found during retroflexion. The hemorrhoids were                            medium-sized. Complications:            No immediate complications. Estimated Blood Loss:     Estimated blood loss was minimal. Impression:               - Normal mucosa in the entire examined colon.                            Biopsied.                           - Diverticulosis in the sigmoid colon and in the                            descending colon.                           - Non-bleeding external and internal hemorrhoids. Recommendation:           - Patient has a contact number available for                            emergencies. The signs and symptoms of potential                            delayed complications were discussed with  the                            patient. Return to normal activities tomorrow.                            Written discharge instructions were provided to the                            patient.                           - Resume previous diet.                           - Continue present medications.                           - Await pathology results.                           -  No repeat colonoscopy due to age. Anna Arnold V. [name], MD 01/13/2024 3:24:00 PM This report has been signed electronically.

## 2024-01-13 NOTE — Progress Notes (Unsigned)
 [facility] History and Physical   Primary Care Physician:  Dayna Motto, DO   Reason for Procedure:  GERD, nausea, epigastric pain, early satiety,change in bowel habits with diarrhea, iron def anemia  Plan:    EGD and colonoscopy with possible interventions as needed     HPI: Anna Arnold is a very pleasant 73 y.o. female here for EGD and colonoscopy for evaluation of GERD, nausea, epigastric pain, early satiety,change in bowel habits with diarrhea, iron def anemia.   The risks and benefits as well as alternatives of endoscopic procedure(s) have been discussed and reviewed. All questions answered. The patient agrees to proceed.    Past Medical History:  Diagnosis Date   Anxiety    Arthritis    Cataracts, bilateral    Cervical spondylosis    Degenerative disc disease, lumbar    Depression    Fibromyalgia    GERD (gastroesophageal reflux disease)    Glaucoma    Hypertension    Migraines    Osteopenia    Seasonal allergies    Sleep disorder     Past Surgical History:  Procedure Laterality Date   bunionectomy Right 2008   CATARACT EXTRACTION Bilateral 2015-2016   CESAREAN SECTION  1982   GLAUCOMA SURGERY Bilateral 2013   HAMMER TOE SURGERY Right 2008   ROTATOR CUFF REPAIR Right 2012    Prior to Admission medications   Medication Sig Start Date End Date Taking? Authorizing Provider  ACETAMINOPHEN PO Take 650 mg by mouth as needed.   Yes [provider]  alendronate (FOSAMAX) 70 MG tablet Take 70 mg by mouth once a week. Take with a full glass of water on an empty stomach.   Yes [provider]  buPROPion (WELLBUTRIN SR) 100 MG 12 hr tablet Take 100 mg by mouth daily.   Yes [provider]  Calcium  Citrate-Vitamin D (CITRACAL PETITES/VITAMIN D) 200-6.25 MG-MCG TABS Take 2 tablets by mouth 2 (two) times daily.   Yes [provider]  Cholecalciferol (VITAMIN D3) 1000 units CAPS Take by mouth.   Yes [provider]  diclofenac Sodium (VOLTAREN) 1 % GEL Apply 4 g topically as needed.   Yes [provider]  Gluc-Chonn-MSM-Boswellia-Vit D (GLUCOSAMINE CHOND TRIPLE/VIT D PO) Take 1 tablet by mouth daily.   Yes [provider]  losartan (COZAAR) 100 MG tablet Take 100 mg by mouth daily.   Yes [provider]  metoCLOPramide  (REGLAN ) 10 MG tablet Take 1 tablet 20-30 minutes prior to drinking colonoscopy prep 12/12/23  Yes Beather Delon Gibson, PA  Multiple Minerals-Vitamins (CALCIUM -MAGNESIUM-ZINC-D3 PO) Take by mouth.   Yes [provider]  Multiple Vitamins-Minerals (MULTI FOR HER 50+) TABS Place 1 tablet into both eyes daily.   Yes [provider]  Omega-3 Fatty Acids (ULTRA OMEGA-3 FISH OIL) 1400 MG CAPS    Yes [provider]  pantoprazole  (PROTONIX ) 20 MG tablet Take 1 tablet (20 mg total) by mouth 2 (two) times daily. 10/04/23  Yes Beather Delon Gibson, PA  WALKER COQ10/UBIQUINOL/MEGA PO Take 1 tablet by mouth daily.   Yes [provider]  albuterol  (VENTOLIN  HFA) 108 (90 Base) MCG/ACT inhaler INHALE TWO PUFFS BY MOUTH EVERY 6 HOURS AS NEEDED FOR WHEEZING AND FOR SHORTNESS OF BREATH 11/13/22   Shelah Lamar RAMAN, MD  azelastine  (ASTELIN ) 0.1 % nasal spray Place 2 sprays into both nostrils 2 (two) times daily. Use in each nostril as directed 08/07/23   Cobb, Comer GAILS, NP  Evolocumab  (REPATHA   SURECLICK) 140 MG/ML SOAJ INJECT 140MG  UNDER THE SKIN EVERY 14 DAYS 10/22/23   Kate Lonni CROME, MD  Ferrous Sulfate (IRON) 325 (65 Fe) MG TABS Take 325 mg by mouth every other day.    [provider]  fluticasone  (FLONASE ) 50 MCG/ACT nasal spray Place 1 spray into both nostrils daily. 08/07/23   Cobb, Comer GAILS, NP  meclizine  (ANTIVERT ) 25 MG tablet Take 1 tablet (25 mg total) by mouth 2 (two) times daily as needed for dizziness. 11/04/23   Dohmeier, Dedra, MD  ondansetron  (ZOFRAN ) 4 MG tablet Take 1 tablet (4 mg total) by mouth  every 6 (six) hours as needed for nausea or vomiting. 12/12/23 03/11/24  Beather Delon Gibson, PA  Polyvinyl Alcohol-Povidone (REFRESH OP) Place 1 drop into both eyes as needed.    [provider]  SUMAtriptan (IMITREX) 100 MG tablet Take 100 mg by mouth every 2 (two) hours as needed for migraine. Take 1/2 tablet for migraine. May repeat in 2 hours if headache persists or recurs.    [provider]    Current Outpatient Medications  Medication Sig Dispense Refill   ACETAMINOPHEN PO Take 650 mg by mouth as needed.     alendronate (FOSAMAX) 70 MG tablet Take 70 mg by mouth once a week. Take with a full glass of water on an empty stomach.     buPROPion (WELLBUTRIN SR) 100 MG 12 hr tablet Take 100 mg by mouth daily.     Calcium  Citrate-Vitamin D (CITRACAL PETITES/VITAMIN D) 200-6.25 MG-MCG TABS Take 2 tablets by mouth 2 (two) times daily.     Cholecalciferol (VITAMIN D3) 1000 units CAPS Take by mouth.     diclofenac Sodium (VOLTAREN) 1 % GEL Apply 4 g topically as needed.     Gluc-Chonn-MSM-Boswellia-Vit D (GLUCOSAMINE CHOND TRIPLE/VIT D PO) Take 1 tablet by mouth daily.     losartan (COZAAR) 100 MG tablet Take 100 mg by mouth daily.     metoCLOPramide  (REGLAN ) 10 MG tablet Take 1 tablet 20-30 minutes prior to drinking colonoscopy prep 2 tablet 0   Multiple Minerals-Vitamins (CALCIUM -MAGNESIUM-ZINC-D3 PO) Take by mouth.     Multiple Vitamins-Minerals (MULTI FOR HER 50+) TABS Place 1 tablet into both eyes daily.     Omega-3 Fatty Acids (ULTRA OMEGA-3 FISH OIL) 1400 MG CAPS      pantoprazole  (PROTONIX ) 20 MG tablet Take 1 tablet (20 mg total) by mouth 2 (two) times daily. 60 tablet 5   QUNOL COQ10/UBIQUINOL/MEGA PO Take 1 tablet by mouth daily.     albuterol  (VENTOLIN  HFA) 108 (90 Base) MCG/ACT inhaler INHALE TWO PUFFS BY MOUTH EVERY 6 HOURS AS NEEDED FOR WHEEZING AND FOR SHORTNESS OF BREATH 18 g 5   azelastine  (ASTELIN ) 0.1 % nasal spray Place 2 sprays into both nostrils 2 (two)  times daily. Use in each nostril as directed 30 mL 5   Evolocumab  (REPATHA  SURECLICK) 140 MG/ML SOAJ INJECT 140MG  UNDER THE SKIN EVERY 14 DAYS 6 mL 3   Ferrous Sulfate (IRON) 325 (65 Fe) MG TABS Take 325 mg by mouth every other day.     fluticasone  (FLONASE ) 50 MCG/ACT nasal spray Place 1 spray into both nostrils daily. 18.2 mL 2   meclizine  (ANTIVERT ) 25 MG tablet Take 1 tablet (25 mg total) by mouth 2 (two) times daily as needed for dizziness. 30 tablet 0   ondansetron  (ZOFRAN ) 4 MG tablet Take 1 tablet (4 mg total) by mouth every 6 (six) hours as needed for nausea or vomiting.  30 tablet 5   Polyvinyl Alcohol-Povidone (REFRESH OP) Place 1 drop into both eyes as needed.     SUMAtriptan (IMITREX) 100 MG tablet Take 100 mg by mouth every 2 (two) hours as needed for migraine. Take 1/2 tablet for migraine. May repeat in 2 hours if headache persists or recurs.     Current Facility-Administered Medications  Medication Dose Route Frequency Provider Last Rate Last Admin   0.9 %  sodium chloride  infusion  500 mL Intravenous Once Onie Kasparek V, MD        Allergies as of 01/13/2024 - Review Complete 01/13/2024  Allergen Reaction Noted   Brimonidine Other (See Comments) 08/31/2020   Ceftin [cefuroxime axetil]  02/13/2016   Netarsudil Other (See Comments) 08/31/2020   Netarsudil dimesylate Dermatitis, Itching, Swelling, and Other (See Comments) 11/19/2019   Viibryd [vilazodone hcl]  02/13/2016   Amitriptyline  02/13/2016   Atenolol  02/13/2016   Augmentin [amoxicillin-pot clavulanate] Nausea Only 02/13/2016   Benzonatate  Nausea Only 08/07/2023   Biaxin [clarithromycin] Nausea Only 02/13/2016   Brinzolamide Anxiety 12/09/2019   Chlorthalidone  02/13/2016   Citalopram hydrobromide Other (See Comments) 02/13/2016   Erythromycin Rash 04/30/2008   Escitalopram Nausea Only 11/15/2023   Fluoxetine Other (See Comments) 02/13/2016   Lexapro [escitalopram oxalate]  02/13/2016   Meloxicam Nausea Only  02/13/2016   Methazolamide Anxiety 11/15/2023   Montelukast Other (See Comments) 11/15/2023   Potassium chloride  02/13/2016   Reglan  [metoclopramide ]  02/13/2016   Rosuvastatin  Other (See Comments) 11/15/2023   Sertraline hcl Other (See Comments) 02/13/2016   Simvastatin  02/13/2016   Singulair [montelukast sodium]  02/13/2016   Travoprost  12/09/2019   Travoprost (bak free) Other (See Comments) 12/12/2023   Zegerid [omeprazole] Nausea Only 02/13/2016    Family History  Problem Relation Age of Onset   Cancer Mother    Depression Mother    Heart disease Father    Heart attack Brother    Depression Brother    Colon cancer Neg Hx    Colon polyps Neg Hx    Esophageal cancer Neg Hx    Rectal cancer Neg Hx    Stomach cancer Neg Hx     Social History   Socioeconomic History   Marital status: Married    Spouse name: Not on file   Number of children: Not on file   Years of education: Not on file   Highest education level: Not on file  Occupational History   Occupation: retired  Tobacco Use   Smoking status: Never    Passive exposure: Past   Smokeless tobacco: Never  Vaping Use   Vaping status: Never Used  Substance and Sexual Activity   Alcohol use: No   Drug use: No   Sexual activity: Not on file  Other Topics Concern   Not on file  Social History Narrative   Not on file   Social Drivers of Health   Financial Resource Strain: Not on file  Food Insecurity: Not on file  Transportation Needs: Not on file  Physical Activity: Not on file  Stress: No Stress Concern Present (01/11/2021)   Received from [facility] - Occupational Stress Questionnaire    Feeling of Stress : Not at all  Social Connections: Unknown (10/10/2021)   Received from [employer]   Social Network    Social Network: Not on file  Intimate Partner Violence: Unknown (09/01/2021)   Received from [facility]   HITS  Physically Hurt: Not on file     Insult or Talk Down To: Not on file    Threaten Physical Harm: Not on file    Scream or Curse: Not on file    Review of Systems:  All other review of systems negative except as mentioned in the HPI.  Physical Exam: Vital signs in last 24 hours: BP (!) 158/98   Pulse 82   Temp 97.9 F (36.6 C) (Skin)   Ht 4' 11 (1.499 m)   Wt 133 lb (60.3 kg)   SpO2 98%   BMI 26.86 kg/m  General:   Alert, NAD Lungs:  Clear .   Heart:  Regular rate and rhythm Abdomen:  Soft, nontender and nondistended. Neuro/Psych:  Alert and cooperative. Normal mood and affect. A and O x 3  Reviewed labs, radiology imaging, old records and pertinent past GI work up  Patient is appropriate for planned procedure(s) and anesthesia in an ambulatory setting   K. Veena Assata Juncaj , MD [phone]

## 2024-01-13 NOTE — Patient Instructions (Addendum)
 Resume previous diet  Continue present medications. Use Protonix  (pantoprazole ) 40mg  twice daily. Await pathology results  Follow antireflux regimen See handout for diverticulosis, hemorrhoids hiatal hernia and gastritis  YOU HAD AN ENDOSCOPIC PROCEDURE TODAY AT THE [facility]:   Refer to the procedure report that was given to you for any specific questions about what was found during the examination.  If the procedure report does not answer your questions, please call your gastroenterologist to clarify.  If you requested that your care partner not be given the details of your procedure findings, then the procedure report has been included in a sealed envelope for you to review at your convenience later.  YOU SHOULD EXPECT: Some feelings of bloating in the abdomen. Passage of more gas than usual.  Walking can help get rid of the air that was put into your GI tract during the procedure and reduce the bloating. If you had a lower endoscopy (such as a colonoscopy or flexible sigmoidoscopy) you may notice spotting of blood in your stool or on the toilet paper. If you underwent a bowel prep for your procedure, you may not have a normal bowel movement for a few days.  Please Note:  You might notice some irritation and congestion in your nose or some drainage.  This is from the oxygen used during your procedure.  There is no need for concern and it should clear up in a day or so.  SYMPTOMS TO REPORT IMMEDIATELY: Following lower endoscopy (colonoscopy or flexible sigmoidoscopy):  Excessive amounts of blood in the stool  Significant tenderness or worsening of abdominal pains  Swelling of the abdomen that is new, acute  Fever of 100F or higher Following upper endoscopy (EGD)  Vomiting of blood or coffee ground material  New chest pain or pain under the shoulder blades  Painful or persistently difficult swallowing  New shortness of breath  Black, tarry-looking stools  For urgent or  emergent issues, a gastroenterologist can be reached at any hour by calling [phone]. Do not use MyChart messaging for urgent concerns.   DIET:  We do recommend a small meal at first, but then you may proceed to your regular diet.  Drink plenty of fluids but you should avoid alcoholic beverages for 24 hours.  ACTIVITY:  You should plan to take it easy for the rest of today and you should NOT DRIVE or use heavy machinery until tomorrow (because of the sedation medicines used during the test).    FOLLOW UP: Our staff will call the number listed on your records the next business day following your procedure.  We will call around 7:15- 8:00 am to check on you and address any questions or concerns that you may have regarding the information given to you following your procedure. If we do not reach you, we will leave a message.     If any biopsies were taken you will be contacted by phone or by letter within the next 1-3 weeks.  Please call us  at [phone] if you have not heard about the biopsies in 3 weeks.   SIGNATURES/CONFIDENTIALITY: You and/or your care partner have signed paperwork which will be entered into your electronic medical record.  These signatures attest to the fact that that the information above on your After Visit Summary has been reviewed and is understood.  Full responsibility of the confidentiality of this discharge information lies with you and/or your care-partner.

## 2024-01-13 NOTE — Progress Notes (Unsigned)
 Report given to PACU, vss

## 2024-01-13 NOTE — Progress Notes (Unsigned)
1443 Robinul 0.1 mg IV given due large amount of secretions upon assessment.  MD made aware, vss

## 2024-01-13 NOTE — Op Note (Signed)
 [facility] Patient Name: Anna Arnold Procedure Date: 01/13/2024 2:44 PM MRN: [id number] Endoscopist: Gustav ALONSO Mcgee , MD, [id number] Age: 73 Referring MD:  Date of Birth: 11/21/1950 Gender: Female Account #: [id number] Procedure:                Upper GI endoscopy Indications:              Epigastric abdominal pain, Dyspepsia, Esophageal                            reflux symptoms that persist despite appropriate                            therapy, Anorexia, Nausea Medicines:                Monitored Anesthesia Care Procedure:                Pre-Anesthesia Assessment:                           - Prior to the procedure, a History and Physical                            was performed, and patient medications and                            allergies were reviewed. The patient's tolerance of                            previous anesthesia was also reviewed. The risks                            and benefits of the procedure and the sedation                            options and risks were discussed with the patient.                            All questions were answered, and informed consent                            was obtained. Prior Anticoagulants: The patient has                            taken no anticoagulant or antiplatelet agents. ASA                            Grade Assessment: II - A patient with mild systemic                            disease. After reviewing the risks and benefits,                            the patient was deemed in satisfactory condition to  undergo the procedure.                           After obtaining informed consent, the endoscope was                            passed under direct vision. Throughout the                            procedure, the patient's blood pressure, pulse, and                            oxygen saturations were monitored continuously. The                            Olympus Scope  [id number] was introduced through the                            mouth, and advanced to the second part of duodenum.                            The upper GI endoscopy was accomplished without                            difficulty. The patient tolerated the procedure                            well. Scope In: Scope Out: Findings:                 The Z-line was regular and was found 32 cm from the                            incisors.                           A large hiatal hernia was present.                           A few dispersed erosions with no bleeding and no                            stigmata of recent bleeding were found in the                            gastric body. Biopsies were taken with a cold                            forceps for histology.                           Patchy mild inflammation characterized by                            congestion (edema), erosions, erythema and  friability was found in the entire examined                            stomach. Biopsies were taken with a cold forceps                            for Helicobacter pylori testing.                           The examined duodenum was normal. Complications:            No immediate complications. Estimated Blood Loss:     Estimated blood loss was minimal. Impression:               - Z-line regular, 32 cm from the incisors.                           - Large hiatal hernia.                           - Gastric erosions with no bleeding and no stigmata                            of recent bleeding. Biopsied.                           - Gastritis. Biopsied.                           - Normal examined duodenum. Recommendation:           - Patient has a contact number available for                            emergencies. The signs and symptoms of potential                            delayed complications were discussed with the                            patient. Return to normal  activities tomorrow.                            Written discharge instructions were provided to the                            patient.                           - Resume previous diet.                           - Continue present medications.                           - Await pathology results.                           -  Follow an antireflux regimen.                           - Use Protonix  (pantoprazole ) 40 mg PO BID. Rx for                            90 days with 3 refills                           - Refer to Dr Tanda for symptomatic hiatal hernia                            repair Gustav ALONSO Mcgee, MD 01/13/2024 3:27:08 PM This report has been signed electronically.

## 2024-01-14 ENCOUNTER — Encounter: Payer: Self-pay | Admitting: Gastroenterology

## 2024-01-14 ENCOUNTER — Telehealth: Payer: Self-pay | Admitting: *Deleted

## 2024-01-14 ENCOUNTER — Telehealth: Payer: Self-pay

## 2024-01-14 DIAGNOSIS — K449 Diaphragmatic hernia without obstruction or gangrene: Secondary | ICD-10-CM

## 2024-01-14 NOTE — Telephone Encounter (Signed)
 Repatha  replacement fax sent to [facility]

## 2024-01-14 NOTE — Telephone Encounter (Signed)
  Follow up Call-     01/13/2024    2:11 PM  Call back number  Post procedure Call Back phone  # [phone]  Permission to leave phone message Yes     Patient questions:  Do you have a fever, pain , or abdominal swelling? No. Pain Score  0 *  Have you tolerated food without any problems? Yes.    Have you been able to return to your normal activities? Yes.    Do you have any questions about your discharge instructions: Diet   No. Medications  No. Follow up visit  No.  Do you have questions or concerns about your Care? No.  Actions: * If pain score is 4 or above: No action needed, pain <4.  Patient with c/o some throat discomfort while eating and drinking since yesterday but expressing is feeling better. Explained to patient this discomfort can occur after having an upper endoscopic procedure. Patient expressed understanding. Advised to call office if it gets worse or any other symptoms occur.

## 2024-01-14 NOTE — Telephone Encounter (Signed)
 Per  Endoscopy report patient needs referral to Dr Tanda for Symptomatic hiatal hernia repair   Will fax records today

## 2024-01-16 DIAGNOSIS — M545 Low back pain, unspecified: Secondary | ICD-10-CM | POA: Diagnosis not present

## 2024-01-16 DIAGNOSIS — M25552 Pain in left hip: Secondary | ICD-10-CM | POA: Diagnosis not present

## 2024-01-16 DIAGNOSIS — M25551 Pain in right hip: Secondary | ICD-10-CM | POA: Diagnosis not present

## 2024-01-16 LAB — SURGICAL PATHOLOGY

## 2024-01-21 ENCOUNTER — Ambulatory Visit: Admitting: Neurology

## 2024-01-22 DIAGNOSIS — M25551 Pain in right hip: Secondary | ICD-10-CM | POA: Diagnosis not present

## 2024-01-22 DIAGNOSIS — M629 Disorder of muscle, unspecified: Secondary | ICD-10-CM | POA: Diagnosis not present

## 2024-01-22 DIAGNOSIS — E785 Hyperlipidemia, unspecified: Secondary | ICD-10-CM | POA: Diagnosis not present

## 2024-01-22 DIAGNOSIS — M25552 Pain in left hip: Secondary | ICD-10-CM | POA: Diagnosis not present

## 2024-01-22 DIAGNOSIS — M545 Low back pain, unspecified: Secondary | ICD-10-CM | POA: Diagnosis not present

## 2024-01-23 ENCOUNTER — Ambulatory Visit: Payer: Self-pay | Admitting: General Practice

## 2024-01-23 LAB — HEPATIC FUNCTION PANEL
ALT: 16 IU/L (ref 0–32)
AST: 23 IU/L (ref 0–40)
Albumin: 4.6 g/dL (ref 3.8–4.8)
Alkaline Phosphatase: 66 IU/L (ref 44–121)
Bilirubin Total: 0.4 mg/dL (ref 0.0–1.2)
Bilirubin, Direct: 0.14 mg/dL (ref 0.00–0.40)
Total Protein: 6.8 g/dL (ref 6.0–8.5)

## 2024-01-23 LAB — LIPID PANEL
Chol/HDL Ratio: 2.8 ratio (ref 0.0–4.4)
Cholesterol, Total: 175 mg/dL (ref 100–199)
HDL: 63 mg/dL (ref >39)
LDL Chol Calc (NIH): 91 mg/dL (ref 0–99)
Triglycerides: 118 mg/dL (ref 0–149)
VLDL Cholesterol Cal: 21 mg/dL (ref 5–40)

## 2024-01-23 NOTE — Telephone Encounter (Signed)
 Records were faxed that day, confirmation in my purple referrals folder

## 2024-01-26 DIAGNOSIS — J4520 Mild intermittent asthma, uncomplicated: Secondary | ICD-10-CM | POA: Diagnosis not present

## 2024-01-26 DIAGNOSIS — I129 Hypertensive chronic kidney disease with stage 1 through stage 4 chronic kidney disease, or unspecified chronic kidney disease: Secondary | ICD-10-CM | POA: Diagnosis not present

## 2024-01-26 DIAGNOSIS — F339 Major depressive disorder, recurrent, unspecified: Secondary | ICD-10-CM | POA: Diagnosis not present

## 2024-01-26 DIAGNOSIS — M810 Age-related osteoporosis without current pathological fracture: Secondary | ICD-10-CM | POA: Diagnosis not present

## 2024-01-29 DIAGNOSIS — M25552 Pain in left hip: Secondary | ICD-10-CM | POA: Diagnosis not present

## 2024-01-29 DIAGNOSIS — M25551 Pain in right hip: Secondary | ICD-10-CM | POA: Diagnosis not present

## 2024-01-29 DIAGNOSIS — M629 Disorder of muscle, unspecified: Secondary | ICD-10-CM | POA: Diagnosis not present

## 2024-01-29 DIAGNOSIS — M545 Low back pain, unspecified: Secondary | ICD-10-CM | POA: Diagnosis not present

## 2024-01-31 NOTE — Telephone Encounter (Signed)
 Patient has appointment with [facility] on 02/26/24

## 2024-02-05 DIAGNOSIS — M629 Disorder of muscle, unspecified: Secondary | ICD-10-CM | POA: Diagnosis not present

## 2024-02-05 DIAGNOSIS — M545 Low back pain, unspecified: Secondary | ICD-10-CM | POA: Diagnosis not present

## 2024-02-05 DIAGNOSIS — M25552 Pain in left hip: Secondary | ICD-10-CM | POA: Diagnosis not present

## 2024-02-05 DIAGNOSIS — M25551 Pain in right hip: Secondary | ICD-10-CM | POA: Diagnosis not present

## 2024-02-11 DIAGNOSIS — M545 Low back pain, unspecified: Secondary | ICD-10-CM | POA: Diagnosis not present

## 2024-02-11 DIAGNOSIS — M25551 Pain in right hip: Secondary | ICD-10-CM | POA: Diagnosis not present

## 2024-02-11 DIAGNOSIS — M25552 Pain in left hip: Secondary | ICD-10-CM | POA: Diagnosis not present

## 2024-02-21 DIAGNOSIS — Z1231 Encounter for screening mammogram for malignant neoplasm of breast: Secondary | ICD-10-CM | POA: Diagnosis not present

## 2024-02-21 DIAGNOSIS — M25552 Pain in left hip: Secondary | ICD-10-CM | POA: Diagnosis not present

## 2024-02-21 DIAGNOSIS — M25551 Pain in right hip: Secondary | ICD-10-CM | POA: Diagnosis not present

## 2024-02-21 DIAGNOSIS — M545 Low back pain, unspecified: Secondary | ICD-10-CM | POA: Diagnosis not present

## 2024-02-25 DIAGNOSIS — J4520 Mild intermittent asthma, uncomplicated: Secondary | ICD-10-CM | POA: Diagnosis not present

## 2024-02-25 DIAGNOSIS — F339 Major depressive disorder, recurrent, unspecified: Secondary | ICD-10-CM | POA: Diagnosis not present

## 2024-02-25 DIAGNOSIS — M810 Age-related osteoporosis without current pathological fracture: Secondary | ICD-10-CM | POA: Diagnosis not present

## 2024-02-25 DIAGNOSIS — I129 Hypertensive chronic kidney disease with stage 1 through stage 4 chronic kidney disease, or unspecified chronic kidney disease: Secondary | ICD-10-CM | POA: Diagnosis not present

## 2024-02-26 DIAGNOSIS — J849 Interstitial pulmonary disease, unspecified: Secondary | ICD-10-CM | POA: Diagnosis not present

## 2024-02-26 DIAGNOSIS — K219 Gastro-esophageal reflux disease without esophagitis: Secondary | ICD-10-CM | POA: Diagnosis not present

## 2024-02-26 DIAGNOSIS — Z86718 Personal history of other venous thrombosis and embolism: Secondary | ICD-10-CM | POA: Diagnosis not present

## 2024-02-26 DIAGNOSIS — K449 Diaphragmatic hernia without obstruction or gangrene: Secondary | ICD-10-CM | POA: Diagnosis not present

## 2024-02-27 DIAGNOSIS — E611 Iron deficiency: Secondary | ICD-10-CM | POA: Diagnosis not present

## 2024-02-27 DIAGNOSIS — E785 Hyperlipidemia, unspecified: Secondary | ICD-10-CM | POA: Diagnosis not present

## 2024-02-27 DIAGNOSIS — M810 Age-related osteoporosis without current pathological fracture: Secondary | ICD-10-CM | POA: Diagnosis not present

## 2024-02-27 DIAGNOSIS — I10 Essential (primary) hypertension: Secondary | ICD-10-CM | POA: Diagnosis not present

## 2024-02-28 DIAGNOSIS — M25552 Pain in left hip: Secondary | ICD-10-CM | POA: Diagnosis not present

## 2024-02-28 DIAGNOSIS — M545 Low back pain, unspecified: Secondary | ICD-10-CM | POA: Diagnosis not present

## 2024-02-28 DIAGNOSIS — M25551 Pain in right hip: Secondary | ICD-10-CM | POA: Diagnosis not present

## 2024-03-01 ENCOUNTER — Other Ambulatory Visit (HOSPITAL_BASED_OUTPATIENT_CLINIC_OR_DEPARTMENT_OTHER): Payer: Self-pay | Admitting: Family Medicine

## 2024-03-01 DIAGNOSIS — M810 Age-related osteoporosis without current pathological fracture: Secondary | ICD-10-CM

## 2024-03-02 DIAGNOSIS — H00015 Hordeolum externum left lower eyelid: Secondary | ICD-10-CM | POA: Diagnosis not present

## 2024-03-06 ENCOUNTER — Other Ambulatory Visit: Payer: Self-pay | Admitting: General Surgery

## 2024-03-06 DIAGNOSIS — K449 Diaphragmatic hernia without obstruction or gangrene: Secondary | ICD-10-CM

## 2024-03-09 ENCOUNTER — Telehealth: Payer: Self-pay | Admitting: Cardiology

## 2024-03-09 NOTE — Telephone Encounter (Signed)
 Patient called stating they are going to be sending a form to complete to replace repatha  sureclick that was defective.  [facility] case # [id number]. Phone # [phone]

## 2024-03-11 ENCOUNTER — Other Ambulatory Visit: Payer: Self-pay | Admitting: Pharmacist

## 2024-03-11 DIAGNOSIS — E785 Hyperlipidemia, unspecified: Secondary | ICD-10-CM

## 2024-03-11 MED ORDER — REPATHA SURECLICK 140 MG/ML ~~LOC~~ SOAJ: 1.0000 mL | SUBCUTANEOUS | 0 refills | Status: AC

## 2024-03-11 NOTE — Telephone Encounter (Signed)
 Replacement sent to [facility]

## 2024-03-25 ENCOUNTER — Ambulatory Visit: Payer: Self-pay | Admitting: Gastroenterology

## 2024-03-27 DIAGNOSIS — I129 Hypertensive chronic kidney disease with stage 1 through stage 4 chronic kidney disease, or unspecified chronic kidney disease: Secondary | ICD-10-CM | POA: Diagnosis not present

## 2024-03-27 DIAGNOSIS — M810 Age-related osteoporosis without current pathological fracture: Secondary | ICD-10-CM | POA: Diagnosis not present

## 2024-03-27 DIAGNOSIS — F339 Major depressive disorder, recurrent, unspecified: Secondary | ICD-10-CM | POA: Diagnosis not present

## 2024-03-27 DIAGNOSIS — J4520 Mild intermittent asthma, uncomplicated: Secondary | ICD-10-CM | POA: Diagnosis not present

## 2024-04-08 DIAGNOSIS — Z6827 Body mass index (BMI) 27.0-27.9, adult: Secondary | ICD-10-CM | POA: Diagnosis not present

## 2024-04-08 DIAGNOSIS — R053 Chronic cough: Secondary | ICD-10-CM | POA: Diagnosis not present

## 2024-04-16 ENCOUNTER — Ambulatory Visit
Admission: RE | Admit: 2024-04-16 | Discharge: 2024-04-16 | Disposition: A | Discharge status: Home / Self Care | Source: Ambulatory Visit | Attending: General Surgery | Admitting: General Surgery

## 2024-04-16 DIAGNOSIS — K2289 Other specified disease of esophagus: Secondary | ICD-10-CM | POA: Diagnosis not present

## 2024-04-16 DIAGNOSIS — K449 Diaphragmatic hernia without obstruction or gangrene: Secondary | ICD-10-CM | POA: Diagnosis not present

## 2024-04-26 DIAGNOSIS — M810 Age-related osteoporosis without current pathological fracture: Secondary | ICD-10-CM | POA: Diagnosis not present

## 2024-04-26 DIAGNOSIS — F339 Major depressive disorder, recurrent, unspecified: Secondary | ICD-10-CM | POA: Diagnosis not present

## 2024-04-26 DIAGNOSIS — J4520 Mild intermittent asthma, uncomplicated: Secondary | ICD-10-CM | POA: Diagnosis not present

## 2024-04-26 DIAGNOSIS — I129 Hypertensive chronic kidney disease with stage 1 through stage 4 chronic kidney disease, or unspecified chronic kidney disease: Secondary | ICD-10-CM | POA: Diagnosis not present

## 2024-04-28 ENCOUNTER — Other Ambulatory Visit: Payer: Self-pay

## 2024-04-28 ENCOUNTER — Telehealth: Payer: Self-pay | Admitting: Gastroenterology

## 2024-04-28 DIAGNOSIS — K219 Gastro-esophageal reflux disease without esophagitis: Secondary | ICD-10-CM

## 2024-04-28 DIAGNOSIS — K449 Diaphragmatic hernia without obstruction or gangrene: Secondary | ICD-10-CM

## 2024-04-28 NOTE — Telephone Encounter (Signed)
 Patient states [facility] sent provider email in regards to her being scheduled for manometry? Please advise.

## 2024-04-28 NOTE — Telephone Encounter (Signed)
 Referral from Dr Tanda for esophageal manometry received. Patient scheduled for 08/05/24 at 10:30 am. Patient notified. [facility] notified Instructions mailed to the patient.

## 2024-04-28 NOTE — Telephone Encounter (Signed)
 Spoke with patient and [employer]. Confirmed the patient was referred for esophageal manometry 04/17/24. Referral will be faxed to a different number.

## 2024-05-07 DIAGNOSIS — R58 Hemorrhage, not elsewhere classified: Secondary | ICD-10-CM | POA: Diagnosis not present

## 2024-05-07 DIAGNOSIS — E611 Iron deficiency: Secondary | ICD-10-CM | POA: Diagnosis not present

## 2024-05-14 DIAGNOSIS — Z961 Presence of intraocular lens: Secondary | ICD-10-CM | POA: Diagnosis not present

## 2024-05-14 DIAGNOSIS — H401133 Primary open-angle glaucoma, bilateral, severe stage: Secondary | ICD-10-CM | POA: Diagnosis not present

## 2024-07-18 ENCOUNTER — Ambulatory Visit (HOSPITAL_BASED_OUTPATIENT_CLINIC_OR_DEPARTMENT_OTHER)

## 2024-08-05 ENCOUNTER — Encounter (HOSPITAL_COMMUNITY): Payer: Self-pay

## 2024-08-05 ENCOUNTER — Ambulatory Visit (HOSPITAL_COMMUNITY): Admit: 2024-08-05 | Admitting: Gastroenterology

## 2024-08-05 SURGERY — MANOMETRY, ESOPHAGUS
Anesthesia: Choice

## 2024-08-12 ENCOUNTER — Ambulatory Visit: Admitting: Emergency Medicine

## 2024-09-15 ENCOUNTER — Ambulatory Visit (HOSPITAL_BASED_OUTPATIENT_CLINIC_OR_DEPARTMENT_OTHER)

## 2024-09-23 ENCOUNTER — Ambulatory Visit: Admitting: Cardiology
